# Patient Record
Sex: Female | Born: 1975 | Race: White | Hispanic: No | Marital: Married | Smoking: Former smoker
Health system: Southern US, Community
[De-identification: ages and names within clinical notes are randomized; demographics above are authoritative.]

## PROBLEM LIST (undated history)

## (undated) DIAGNOSIS — K219 Gastro-esophageal reflux disease without esophagitis: Secondary | ICD-10-CM

## (undated) DIAGNOSIS — IMO0002 Reserved for concepts with insufficient information to code with codable children: Secondary | ICD-10-CM

## (undated) DIAGNOSIS — R569 Unspecified convulsions: Secondary | ICD-10-CM

## (undated) DIAGNOSIS — G43909 Migraine, unspecified, not intractable, without status migrainosus: Secondary | ICD-10-CM

## (undated) DIAGNOSIS — J45909 Unspecified asthma, uncomplicated: Secondary | ICD-10-CM

## (undated) DIAGNOSIS — F419 Anxiety disorder, unspecified: Secondary | ICD-10-CM

## (undated) HISTORY — PX: ENDOMETRIAL ABLATION: SHX621

## (undated) HISTORY — PX: CHOLECYSTECTOMY: SHX55

## (undated) HISTORY — DX: Gastro-esophageal reflux disease without esophagitis: K21.9

## (undated) HISTORY — PX: BREAST LUMPECTOMY: SHX2

## (undated) HISTORY — PX: TUBAL LIGATION: SHX77

---

## 2011-03-11 ENCOUNTER — Emergency Department (HOSPITAL_COMMUNITY)
Admission: EM | Admit: 2011-03-11 | Discharge: 2011-03-12 | Disposition: A | Discharge status: Home / Self Care | Payer: Medicare Other | Attending: Emergency Medicine | Admitting: Emergency Medicine

## 2011-03-11 DIAGNOSIS — G40909 Epilepsy, unspecified, not intractable, without status epilepticus: Secondary | ICD-10-CM | POA: Insufficient documentation

## 2011-03-11 DIAGNOSIS — R112 Nausea with vomiting, unspecified: Secondary | ICD-10-CM | POA: Insufficient documentation

## 2011-03-11 DIAGNOSIS — R109 Unspecified abdominal pain: Secondary | ICD-10-CM | POA: Insufficient documentation

## 2011-03-11 DIAGNOSIS — Z79899 Other long term (current) drug therapy: Secondary | ICD-10-CM | POA: Insufficient documentation

## 2011-03-11 DIAGNOSIS — R10813 Right lower quadrant abdominal tenderness: Secondary | ICD-10-CM | POA: Insufficient documentation

## 2011-03-11 LAB — CBC
MCH: 32.4 pg (ref 26.0–34.0)
MCHC: 34.7 g/dL (ref 30.0–36.0)
MCV: 93.2 fL (ref 78.0–100.0)
Platelets: 262 10*3/uL (ref 150–400)
RDW: 13.6 % (ref 11.5–15.5)
WBC: 9.3 10*3/uL (ref 4.0–10.5)

## 2011-03-11 LAB — DIFFERENTIAL
Eosinophils Absolute: 0.1 10*3/uL (ref 0.0–0.7)
Eosinophils Relative: 1 % (ref 0–5)
Lymphs Abs: 3.9 10*3/uL (ref 0.7–4.0)
Monocytes Relative: 5 % (ref 3–12)

## 2011-03-11 LAB — URINALYSIS, ROUTINE W REFLEX MICROSCOPIC
Ketones, ur: NEGATIVE mg/dL
Nitrite: NEGATIVE
Specific Gravity, Urine: 1.01 (ref 1.005–1.030)
pH: 7 (ref 5.0–8.0)

## 2011-03-11 LAB — URINE MICROSCOPIC-ADD ON

## 2011-03-12 ENCOUNTER — Emergency Department (HOSPITAL_COMMUNITY): Payer: Medicare Other

## 2011-03-12 LAB — COMPREHENSIVE METABOLIC PANEL
AST: 17 U/L (ref 0–37)
Albumin: 3.2 g/dL — ABNORMAL LOW (ref 3.5–5.2)
Calcium: 9 mg/dL (ref 8.4–10.5)
Creatinine, Ser: 0.56 mg/dL (ref 0.50–1.10)
Total Protein: 6.5 g/dL (ref 6.0–8.3)

## 2011-03-12 LAB — PREGNANCY, URINE: Preg Test, Ur: NEGATIVE

## 2011-03-13 LAB — URINE CULTURE
Colony Count: >=100000
Culture  Setup Time: 201207201048

## 2011-03-19 DIAGNOSIS — G43009 Migraine without aura, not intractable, without status migrainosus: Secondary | ICD-10-CM | POA: Insufficient documentation

## 2011-03-19 DIAGNOSIS — F410 Panic disorder [episodic paroxysmal anxiety] without agoraphobia: Secondary | ICD-10-CM | POA: Insufficient documentation

## 2012-04-27 ENCOUNTER — Emergency Department (HOSPITAL_COMMUNITY)
Admission: EM | Admit: 2012-04-27 | Discharge: 2012-04-27 | Disposition: A | Discharge status: Home / Self Care | Payer: Medicare Other | Attending: Emergency Medicine | Admitting: Emergency Medicine

## 2012-04-27 ENCOUNTER — Encounter (HOSPITAL_COMMUNITY): Payer: Self-pay | Admitting: Emergency Medicine

## 2012-04-27 DIAGNOSIS — F411 Generalized anxiety disorder: Secondary | ICD-10-CM | POA: Insufficient documentation

## 2012-04-27 DIAGNOSIS — G43909 Migraine, unspecified, not intractable, without status migrainosus: Secondary | ICD-10-CM | POA: Insufficient documentation

## 2012-04-27 DIAGNOSIS — F17200 Nicotine dependence, unspecified, uncomplicated: Secondary | ICD-10-CM | POA: Insufficient documentation

## 2012-04-27 HISTORY — DX: Migraine, unspecified, not intractable, without status migrainosus: G43.909

## 2012-04-27 HISTORY — DX: Unspecified convulsions: R56.9

## 2012-04-27 HISTORY — DX: Anxiety disorder, unspecified: F41.9

## 2012-04-27 MED ORDER — PROMETHAZINE HCL 25 MG/ML IJ SOLN
25.0000 mg | Freq: Once | INTRAMUSCULAR | Status: AC
  Administered 2012-04-27: 25 mg via INTRAMUSCULAR
  Filled 2012-04-27 (×2): qty 1

## 2012-04-27 MED ORDER — BUTORPHANOL TARTRATE 1 MG/ML IJ SOLN
1.0000 mg | Freq: Once | INTRAMUSCULAR | Status: AC
  Administered 2012-04-27: 1 mg via INTRAMUSCULAR

## 2012-04-27 NOTE — ED Notes (Addendum)
Migraine since Sunday night; reports her migraine meds are not helping; went to neurologist yesterday and got infusion of depakon, toradol, phenergan- and reports still having migraine; reports tried to go back to MD today, but office was already closed; light sensitivity , nausea; no vomiting; reports long hx of migraines--states gets them once a month

## 2012-04-27 NOTE — ED Provider Notes (Signed)
History     CSN: [id number]  Arrival date & time 04/27/12  1723   First MD Initiated Contact with Patient 04/27/12 2122      Chief Complaint  Patient presents with  . Migraine   HPI  History provided by the patient. Patient is a 36 year old female with history of migraines and seizure disorder who presents with complaints of return of migraine headache. Patient reports having gradual onset of typical migraine symptoms last Sunday, 4 days ago. Patient has both by mouth and IM Imitrex at home which she attempted the use of all times without improvement. Patient was seen by her neurologist, Dr. Ala Dach on Tuesday for her symptoms. She reports receiving an infusion of the medication as well as Phenergan in the office and was feeling some improvements. She did well for most the following day the symptoms returned in the afternoon and continued all day this morning. Patient attempted to recontact Dr. Ala Dach but was unable. Symptoms are associated with some photophobia and nausea symptoms. Patient denies any fever, chills, neck pain, vomiting symptoms. She denies any numbness or weakness in extremities.     Past Medical History  Diagnosis Date  . Migraine   . Seizures   . Anxiety     Past Surgical History  Procedure Date  . Cesarean section   . Cholecystectomy   . Tubal ligation   . Endometrial ablation     History reviewed. No pertinent family history.  History  Substance Use Topics  . Smoking status: Current Everyday Smoker -- 1.5 packs/day    Types: Cigarettes  . Smokeless tobacco: Not on file  . Alcohol Use: No    OB History    Grav Para Term Preterm Abortions TAB SAB Ect Mult Living                  Review of Systems  Constitutional: Negative for fever and chills.  HENT: Negative for sinus pressure.   Eyes: Positive for photophobia.  Respiratory: Negative for cough and shortness of breath.   Cardiovascular: Negative for chest pain.  Gastrointestinal: Positive for  nausea. Negative for vomiting, abdominal pain and constipation.  Genitourinary: Negative for dysuria, frequency and hematuria.  Neurological: Positive for headaches. Negative for dizziness and numbness.    Allergies  Demerol and Ibuprofen  Home Medications   Current Outpatient Rx  Name Route Sig Dispense Refill  . CLONAZEPAM 0.5 MG PO TABS Oral Take 0.5 mg by mouth 3 (three) times daily as needed. For anxiety and 0.5 tablet at bed time    . GABAPENTIN 600 MG PO TABS Oral Take 600 mg by mouth 3 (three) times daily.    Marland Kitchen LEVETIRACETAM 1000 MG PO TABS Oral Take 1,000 mg by mouth 2 (two) times daily.    . SUMATRIPTAN SUCCINATE 100 MG PO TABS Oral Take 100 mg by mouth every 2 (two) hours as needed. For migrain    . VENLAFAXINE HCL ER 150 MG PO CP24 Oral Take 150 mg by mouth daily.      BP 111/71  Pulse 86  Temp 98.5 F (36.9 C) (Oral)  Resp 14  SpO2 98%  Physical Exam  Nursing note and vitals reviewed. Constitutional: She is oriented to person, place, and time. She appears well-developed and well-nourished. No distress.  HENT:  Head: Normocephalic and atraumatic.  Mouth/Throat: Oropharynx is clear and moist.  Eyes: Conjunctivae and EOM are normal. Pupils are equal, round, and reactive to light.  Neck: Normal range of motion. Neck  supple.       No meningeal signs  Cardiovascular: Normal rate and regular rhythm.   Pulmonary/Chest: Effort normal and breath sounds normal. No respiratory distress. She has no wheezes. She has no rales.  Abdominal: Soft. There is no tenderness.  Musculoskeletal: Normal range of motion. She exhibits no edema and no tenderness.  Neurological: She is alert and oriented to person, place, and time. She has normal strength. No cranial nerve deficit or sensory deficit. Coordination normal.  Skin: Skin is warm and dry.  Psychiatric: She has a normal mood and affect. Her behavior is normal.    ED Course  Procedures      1. Migraine       MDM    9:25PM patient seen and evaluated. Patient with history of similar migraines. No red flag symptoms or changes in symptoms from previous. Normal nonfocal neuro exam.    2:45 PM patient reports feeling significant improvements with almost no headache pain at this time. She is ready to return home. She will followup with her neurologist, Dr. Ala Dach and PCP for continued migraine treatments.      Angus Seller, [place] 04/27/12 2252

## 2012-04-28 NOTE — ED Provider Notes (Signed)
Medical screening examination/treatment/procedure(s) were performed by non-physician practitioner and as supervising physician I was immediately available for consultation/collaboration.   Tobin Chad, MD 04/28/12 [phone]

## 2012-06-10 ENCOUNTER — Encounter (HOSPITAL_COMMUNITY): Payer: Self-pay | Admitting: Emergency Medicine

## 2012-06-10 ENCOUNTER — Emergency Department (HOSPITAL_COMMUNITY)
Admission: EM | Admit: 2012-06-10 | Discharge: 2012-06-10 | Disposition: A | Discharge status: Home / Self Care | Payer: Medicare Other | Attending: Emergency Medicine | Admitting: Emergency Medicine

## 2012-06-10 DIAGNOSIS — G43909 Migraine, unspecified, not intractable, without status migrainosus: Secondary | ICD-10-CM | POA: Insufficient documentation

## 2012-06-10 DIAGNOSIS — Z79899 Other long term (current) drug therapy: Secondary | ICD-10-CM | POA: Insufficient documentation

## 2012-06-10 DIAGNOSIS — F17200 Nicotine dependence, unspecified, uncomplicated: Secondary | ICD-10-CM | POA: Insufficient documentation

## 2012-06-10 MED ORDER — KETOROLAC TROMETHAMINE 30 MG/ML IJ SOLN
30.0000 mg | Freq: Once | INTRAMUSCULAR | Status: AC
  Administered 2012-06-10: 30 mg via INTRAVENOUS
  Filled 2012-06-10: qty 1

## 2012-06-10 MED ORDER — METOCLOPRAMIDE HCL 5 MG/ML IJ SOLN
10.0000 mg | Freq: Once | INTRAMUSCULAR | Status: AC
  Administered 2012-06-10: 10 mg via INTRAVENOUS
  Filled 2012-06-10: qty 2

## 2012-06-10 MED ORDER — LORAZEPAM 2 MG/ML IJ SOLN
1.0000 mg | Freq: Once | INTRAMUSCULAR | Status: AC
  Administered 2012-06-10: 1 mg via INTRAVENOUS
  Filled 2012-06-10: qty 1

## 2012-06-10 MED ORDER — DEXAMETHASONE SODIUM PHOSPHATE 4 MG/ML IJ SOLN
10.0000 mg | Freq: Once | INTRAMUSCULAR | Status: AC
  Administered 2012-06-10: 10 mg via INTRAVENOUS
  Filled 2012-06-10: qty 2
  Filled 2012-06-10: qty 1

## 2012-06-10 MED ORDER — SODIUM CHLORIDE 0.9 % IV BOLUS (SEPSIS)
500.0000 mL | INTRAVENOUS | Status: AC
  Administered 2012-06-10: 500 mL via INTRAVENOUS

## 2012-06-10 MED ORDER — DIPHENHYDRAMINE HCL 50 MG/ML IJ SOLN
25.0000 mg | Freq: Once | INTRAMUSCULAR | Status: AC
  Administered 2012-06-10: 25 mg via INTRAVENOUS
  Filled 2012-06-10: qty 1

## 2012-06-10 NOTE — ED Notes (Signed)
Pt c/o migraine HA x 5 days; pt sts seen by neurologist x 3 this week and given stadol and phenergan that worked but came

## 2012-06-10 NOTE — ED Notes (Signed)
States she received a depakon? infusion at neuro md office earlier this week for migraines and has redness in her arm where the iv was. Describes as "sore." light red area noted to RLFA, cms intact, will notify edpa

## 2012-06-10 NOTE — ED Notes (Addendum)
Last saw neurologist on Thursday. Was phenergan & stadol injection with relief of migraine. Reports migraine returned this morning. +n/v, + photophobia. States this is her typical migraine & pain behind both eyes, R>L. Took imitrex 100mg  at 0800 & tylenol 500mg  at 1000 with no change in pain

## 2012-06-10 NOTE — ED Provider Notes (Signed)
History     CSN: [id number]  Arrival date & time 06/10/12  1135   First MD Initiated Contact with Patient 06/10/12 1335      Chief Complaint  Patient presents with  . Migraine    (Consider location/radiation/quality/duration/timing/severity/associated sxs/prior treatment) Patient is a 36 y.o. female presenting with headaches. The history is provided by the patient.  Headache  This is a chronic problem. The current episode started yesterday. The problem occurs constantly. The problem has not changed since onset.The headache is associated with bright light and straining. The pain is located in the frontal region. The quality of the pain is described as throbbing. The pain is at a severity of 10/10. The pain is moderate. The pain does not radiate. Associated symptoms include nausea. Pertinent negatives include no anorexia, no fever, no malaise/fatigue, no chest pressure, no near-syncope, no orthopnea, no palpitations, no syncope, no shortness of breath and no vomiting. Treatments tried: immitrex.    Past Medical History  Diagnosis Date  . Migraine   . Seizures   . Anxiety     Past Surgical History  Procedure Date  . Cesarean section   . Cholecystectomy   . Tubal ligation   . Endometrial ablation     History reviewed. No pertinent family history.  History  Substance Use Topics  . Smoking status: Current Every Day Smoker -- 1.5 packs/day    Types: Cigarettes  . Smokeless tobacco: Not on file  . Alcohol Use: No    OB History    Grav Para Term Preterm Abortions TAB SAB Ect Mult Living                  Review of Systems  Constitutional: Positive for activity change. Negative for fever, chills, malaise/fatigue, diaphoresis and fatigue.  HENT: Negative for ear pain, congestion, facial swelling, neck pain, neck stiffness, sinus pressure and tinnitus.   Eyes: Positive for photophobia. Negative for redness and visual disturbance.  Respiratory: Negative for cough, shortness  of breath, wheezing and stridor.   Cardiovascular: Negative for chest pain, palpitations, orthopnea, syncope and near-syncope.  Gastrointestinal: Positive for nausea. Negative for vomiting, abdominal pain and anorexia.  Musculoskeletal: Negative for myalgias and gait problem.  Skin: Negative for rash.  Neurological: Positive for headaches. Negative for dizziness, syncope, speech difficulty, weakness, light-headedness and numbness.       No bowel or bladder incontinence.  Psychiatric/Behavioral: Negative for confusion.  All other systems reviewed and are negative.    Allergies  Demerol and Ibuprofen  Home Medications   Current Outpatient Rx  Name Route Sig Dispense Refill  . ALBUTEROL SULFATE HFA 108 (90 BASE) MCG/ACT IN AERS Inhalation Inhale 2 puffs into the lungs every 6 (six) hours as needed. For shortness of breath    . CLONAZEPAM 0.5 MG PO TABS Oral Take 0.5 mg by mouth 3 (three) times daily as needed. For anxiety and 0.5 tablet at bed time    . GABAPENTIN 600 MG PO TABS Oral Take 600 mg by mouth 3 (three) times daily.    Marland Kitchen LEVETIRACETAM 1000 MG PO TABS Oral Take 1,000 mg by mouth 2 (two) times daily.    . SUMATRIPTAN SUCCINATE 100 MG PO TABS Oral Take 100 mg by mouth every 2 (two) hours as needed. For migrain    . VENLAFAXINE HCL ER 150 MG PO CP24 Oral Take 150 mg by mouth daily.      BP 93/62  Pulse 90  Temp 98.4 F (36.9 C) (Oral)  Resp 15  SpO2 96%  Physical Exam  Nursing note and vitals reviewed. Constitutional: She is oriented to person, place, and time. She appears well-developed and well-nourished. She appears distressed.  HENT:  Head: Normocephalic and atraumatic.  Eyes: Conjunctivae normal and EOM are normal. Pupils are equal, round, and reactive to light. No scleral icterus.  Neck: Normal range of motion.       Neck supple with no nuchal rigidity, full pain free ROM. No carotid bruit  Cardiovascular: Normal rate, regular rhythm, normal heart sounds and intact  distal pulses.   Pulmonary/Chest: Effort normal and breath sounds normal. No respiratory distress.  Musculoskeletal: Normal range of motion.  Neurological: She is alert and oriented to person, place, and time. She has normal strength.       CN III-XII intact, good coordination, normal gait, strength 5/5 bilaterally, intact distal sensation.  Skin: Skin is warm and dry.       No rash, non diaphoretic    ED Course  Procedures (including critical care time)  Labs Reviewed - No data to display No results found.   No diagnosis found.  Meds ordered this encounter  Medications  . albuterol (PROVENTIL HFA;VENTOLIN HFA) 108 (90 BASE) MCG/ACT inhaler    Sig: Inhale 2 puffs into the lungs every 6 (six) hours as needed. For shortness of breath  . sodium chloride 0.9 % bolus 500 mL    Sig:   . metoCLOPramide (REGLAN) injection 10 mg    Sig:   . diphenhydrAMINE (BENADRYL) injection 25 mg    Sig:   . dexamethasone (DECADRON) injection 10 mg    Sig:   . ketorolac (TORADOL) 30 MG/ML injection 30 mg    Sig:   . LORazepam (ATIVAN) injection 1 mg    Sig:      MDM  HA Presentation is like pts typical HA and non concerning for [facility], ICH, Meningitis, or temporal arteritis. Pt is afebrile with no focal neuro deficits, nuchal rigidity, or change in vision. Pt's HA has has not yet improved. Care resumed by incoming practitioner. Anticpiate discharge.          Jaci Carrel, [place] 06/10/12 1601

## 2012-06-11 NOTE — ED Provider Notes (Signed)
Medical screening examination/treatment/procedure(s) were performed by non-physician practitioner and as supervising physician I was immediately available for consultation/collaboration.   Joya Gaskins, MD 06/11/12 [phone]

## 2012-07-14 ENCOUNTER — Emergency Department (HOSPITAL_COMMUNITY)
Admission: EM | Admit: 2012-07-14 | Discharge: 2012-07-14 | Disposition: A | Discharge status: Home / Self Care | Payer: Medicare Other | Attending: Emergency Medicine | Admitting: Emergency Medicine

## 2012-07-14 ENCOUNTER — Encounter (HOSPITAL_COMMUNITY): Payer: Self-pay | Admitting: Emergency Medicine

## 2012-07-14 DIAGNOSIS — F411 Generalized anxiety disorder: Secondary | ICD-10-CM | POA: Insufficient documentation

## 2012-07-14 DIAGNOSIS — Z79899 Other long term (current) drug therapy: Secondary | ICD-10-CM | POA: Insufficient documentation

## 2012-07-14 DIAGNOSIS — G43909 Migraine, unspecified, not intractable, without status migrainosus: Secondary | ICD-10-CM

## 2012-07-14 DIAGNOSIS — I498 Other specified cardiac arrhythmias: Secondary | ICD-10-CM | POA: Insufficient documentation

## 2012-07-14 DIAGNOSIS — F17200 Nicotine dependence, unspecified, uncomplicated: Secondary | ICD-10-CM | POA: Insufficient documentation

## 2012-07-14 DIAGNOSIS — R569 Unspecified convulsions: Secondary | ICD-10-CM

## 2012-07-14 LAB — COMPREHENSIVE METABOLIC PANEL
ALT: 22 U/L (ref 0–35)
Calcium: 8.9 mg/dL (ref 8.4–10.5)
Creatinine, Ser: 0.67 mg/dL (ref 0.50–1.10)
GFR calc Af Amer: >90 mL/min (ref >90)
Glucose, Bld: 166 mg/dL — ABNORMAL HIGH (ref 70–99)
Sodium: 134 mEq/L — ABNORMAL LOW (ref 135–145)
Total Protein: 7.1 g/dL (ref 6.0–8.3)

## 2012-07-14 LAB — URINALYSIS, ROUTINE W REFLEX MICROSCOPIC
Bilirubin Urine: NEGATIVE
Leukocytes, UA: NEGATIVE
Nitrite: NEGATIVE
Specific Gravity, Urine: 1.013 (ref 1.005–1.030)
Urobilinogen, UA: 0.2 mg/dL (ref 0.0–1.0)
pH: 6 (ref 5.0–8.0)

## 2012-07-14 LAB — CBC WITH DIFFERENTIAL/PLATELET
Basophils Absolute: 0 10*3/uL (ref 0.0–0.1)
Eosinophils Absolute: 0.1 10*3/uL (ref 0.0–0.7)
Eosinophils Relative: 1 % (ref 0–5)
Lymphs Abs: 2.3 10*3/uL (ref 0.7–4.0)
MCH: 31.4 pg (ref 26.0–34.0)
MCV: 95 fL (ref 78.0–100.0)
Monocytes Absolute: 0.4 10*3/uL (ref 0.1–1.0)
Platelets: 258 10*3/uL (ref 150–400)
RDW: 14 % (ref 11.5–15.5)

## 2012-07-14 MED ORDER — DEXAMETHASONE SODIUM PHOSPHATE 10 MG/ML IJ SOLN
10.0000 mg | Freq: Once | INTRAMUSCULAR | Status: AC
  Administered 2012-07-14: 10 mg via INTRAVENOUS
  Filled 2012-07-14: qty 1

## 2012-07-14 MED ORDER — METOCLOPRAMIDE HCL 5 MG/ML IJ SOLN
10.0000 mg | Freq: Once | INTRAMUSCULAR | Status: AC
  Administered 2012-07-14: 10 mg via INTRAVENOUS
  Filled 2012-07-14: qty 2

## 2012-07-14 MED ORDER — LORAZEPAM 2 MG/ML IJ SOLN
1.0000 mg | Freq: Once | INTRAMUSCULAR | Status: AC
  Administered 2012-07-14: 1 mg via INTRAVENOUS
  Filled 2012-07-14: qty 1

## 2012-07-14 MED ORDER — HYDROMORPHONE HCL PF 1 MG/ML IJ SOLN
1.0000 mg | Freq: Once | INTRAMUSCULAR | Status: AC
  Administered 2012-07-14: 1 mg via INTRAVENOUS
  Filled 2012-07-14: qty 1

## 2012-07-14 MED ORDER — DIPHENHYDRAMINE HCL 50 MG/ML IJ SOLN
25.0000 mg | Freq: Once | INTRAMUSCULAR | Status: AC
  Administered 2012-07-14: 25 mg via INTRAVENOUS
  Filled 2012-07-14: qty 1

## 2012-07-14 MED ORDER — ONDANSETRON HCL 4 MG/2ML IJ SOLN
4.0000 mg | Freq: Once | INTRAMUSCULAR | Status: AC
  Administered 2012-07-14: 4 mg via INTRAVENOUS
  Filled 2012-07-14: qty 2

## 2012-07-14 NOTE — ED Notes (Signed)
States that she missed her sz meds yesterday and today because she was so nauseated it is keppra

## 2012-07-14 NOTE — ED Provider Notes (Signed)
History     CSN: [id number]  Arrival date & time 07/14/12  1448   First MD Initiated Contact with Patient 07/14/12 1741      No chief complaint on file.   (Consider location/radiation/quality/duration/timing/severity/associated sxs/prior treatment) Patient is a 36 y.o. female presenting with headaches. The history is provided by the patient.  Headache  This is a new problem. The current episode started more than 2 days ago. The problem occurs constantly. The problem has been gradually worsening. The headache is associated with bright light and loud noise. The pain is located in the right unilateral region. The quality of the pain is described as throbbing. The pain is at a severity of 9/10. The pain is severe. The pain does not radiate. Associated symptoms include anorexia, nausea and vomiting. Pertinent negatives include no fever, no palpitations and no shortness of breath. Associated symptoms comments: The patient has a history of seizure disorder. She takes Keppra for this but has not taken in 2 days. She states she had 2 seizures back-to-back today but denies any trauma. The seizures occurred while she was lying in bed. She states that she's had a migraine all week she had a headache before the seizure and it got worse after the seizure. She denies any weakness, numbness or vision change. She has tried acetaminophen for the symptoms. The treatment provided no relief.    Past Medical History  Diagnosis Date  . Migraine   . Seizures   . Anxiety     Past Surgical History  Procedure Date  . Cesarean section   . Cholecystectomy   . Tubal ligation   . Endometrial ablation     No family history on file.  History  Substance Use Topics  . Smoking status: Current Every Day Smoker -- 1.5 packs/day    Types: Cigarettes  . Smokeless tobacco: Not on file  . Alcohol Use: No    OB History    Grav Para Term Preterm Abortions TAB SAB Ect Mult Living                  Review of  Systems  Constitutional: Negative for fever.  Respiratory: Negative for shortness of breath.   Cardiovascular: Negative for palpitations.  Gastrointestinal: Positive for nausea, vomiting and anorexia.  Neurological: Positive for headaches. Negative for speech difficulty, weakness and numbness.  All other systems reviewed and are negative.    Allergies  Demerol and Ibuprofen  Home Medications   Current Outpatient Rx  Name  Route  Sig  Dispense  Refill  . CLONAZEPAM 0.5 MG PO TABS   Oral   Take 0.5 mg by mouth 3 (three) times daily as needed. For anxiety and 0.5 tablet at bed time         . ALBUTEROL SULFATE HFA 108 (90 BASE) MCG/ACT IN AERS   Inhalation   Inhale 2 puffs into the lungs every 6 (six) hours as needed. For shortness of breath         . GABAPENTIN 600 MG PO TABS   Oral   Take 600 mg by mouth 3 (three) times daily.         Marland Kitchen LEVETIRACETAM 1000 MG PO TABS   Oral   Take 1,000 mg by mouth 2 (two) times daily.         . SUMATRIPTAN SUCCINATE 100 MG PO TABS   Oral   Take 100 mg by mouth every 2 (two) hours as needed. For migrain         .  VENLAFAXINE HCL ER 150 MG PO CP24   Oral   Take 150 mg by mouth daily.           BP 117/71  Pulse 108  Temp 98.5 F (36.9 C) (Oral)  Resp 18  SpO2 97%  Physical Exam  Nursing note and vitals reviewed. Constitutional: She is oriented to person, place, and time. She appears well-developed and well-nourished. She appears distressed.  HENT:  Head: Normocephalic and atraumatic.  Eyes: EOM are normal. Pupils are equal, round, and reactive to light.  Fundoscopic exam:      The right eye shows no papilledema.       The left eye shows no papilledema.  Neck: Normal range of motion. Neck supple.  Cardiovascular: Normal rate, regular rhythm, normal heart sounds and intact distal pulses.  Exam reveals no friction rub.   No murmur heard. Pulmonary/Chest: Effort normal and breath sounds normal. She has no wheezes. She  has no rales.  Abdominal: Soft. Bowel sounds are normal. She exhibits no distension. There is no tenderness. There is no rebound and no guarding.  Musculoskeletal: Normal range of motion. She exhibits no tenderness.       No edema  Lymphadenopathy:    She has no cervical adenopathy.  Neurological: She is alert and oriented to person, place, and time. She has normal strength. No cranial nerve deficit or sensory deficit. Gait normal.       photophobia  Skin: Skin is warm and dry. No rash noted.  Psychiatric: She has a normal mood and affect. Her behavior is normal.    ED Course  Procedures (including critical care time)  Labs Reviewed  COMPREHENSIVE METABOLIC PANEL - Abnormal; Notable for the following:    Sodium 134 (*)     Glucose, Bld 166 (*)     Albumin 3.4 (*)     Total Bilirubin 0.2 (*)     All other components within normal limits  CBC WITH DIFFERENTIAL  URINALYSIS, ROUTINE W REFLEX MICROSCOPIC   No results found.   Date: 07/14/2012  Rate: 119  Rhythm: sinus tachycardia  QRS Axis: normal  Intervals: normal  ST/T Wave abnormalities: normal  Conduction Disutrbances:none  Narrative Interpretation:   Old EKG Reviewed: none available    1. Migraine   2. Seizure       MDM   Patient does have a history of a seizure disorder and has not taken her Her for 2 days do to being nauseated with her migraine. She has no focal neurologic deficits and is currently not seizing. She was given 1 mg of Ativan to avoid further seizures. Pt with typical migraine HA without sx suggestive of SAH(sudden onset, worst of life, or deficits), infection, or cavernous vein thrombosis.  Normal neuro exam and vital signs. Will give HA cocktail and on re-eval.  7:48 PM On reeval pt states HA is from a 10 to a 7.  Will give second round of pain meds and recheck.  8:43 PM Pt feeling better and ready to go home.     Gwyneth Sprout, MD 07/14/12 2043

## 2012-07-14 NOTE — ED Notes (Addendum)
Per patient she says her son heard her fall today and she was "trembling" when he came in the room and he saw her have another seizure. Pt states that she has been nauseous x3 days and has been unable to take her seizure medications for three days. Pt states her last seizure was about a month ago and she thinks she was taking her medications then.

## 2012-07-14 NOTE — ED Notes (Signed)
Was told she had  2 sz today has seen her neuro dr x 2 this week for migrains was given infusion and then injections but not helping still has h/a

## 2012-07-14 NOTE — ED Notes (Signed)
Pt states "I am ready to be discharged, I am feeling much better."

## 2012-07-27 ENCOUNTER — Encounter (HOSPITAL_COMMUNITY): Payer: Self-pay | Admitting: Emergency Medicine

## 2012-07-27 ENCOUNTER — Emergency Department (HOSPITAL_COMMUNITY): Payer: Medicare Other

## 2012-07-27 ENCOUNTER — Emergency Department (HOSPITAL_COMMUNITY)
Admission: EM | Admit: 2012-07-27 | Discharge: 2012-07-27 | Disposition: A | Discharge status: Home / Self Care | Payer: Medicare Other | Attending: Emergency Medicine | Admitting: Emergency Medicine

## 2012-07-27 DIAGNOSIS — Z79899 Other long term (current) drug therapy: Secondary | ICD-10-CM | POA: Insufficient documentation

## 2012-07-27 DIAGNOSIS — R112 Nausea with vomiting, unspecified: Secondary | ICD-10-CM | POA: Insufficient documentation

## 2012-07-27 DIAGNOSIS — Z8679 Personal history of other diseases of the circulatory system: Secondary | ICD-10-CM | POA: Insufficient documentation

## 2012-07-27 DIAGNOSIS — R109 Unspecified abdominal pain: Secondary | ICD-10-CM

## 2012-07-27 DIAGNOSIS — F411 Generalized anxiety disorder: Secondary | ICD-10-CM | POA: Insufficient documentation

## 2012-07-27 DIAGNOSIS — G40909 Epilepsy, unspecified, not intractable, without status epilepticus: Secondary | ICD-10-CM | POA: Insufficient documentation

## 2012-07-27 DIAGNOSIS — R1031 Right lower quadrant pain: Secondary | ICD-10-CM | POA: Insufficient documentation

## 2012-07-27 DIAGNOSIS — F17200 Nicotine dependence, unspecified, uncomplicated: Secondary | ICD-10-CM | POA: Insufficient documentation

## 2012-07-27 DIAGNOSIS — Z7982 Long term (current) use of aspirin: Secondary | ICD-10-CM | POA: Insufficient documentation

## 2012-07-27 DIAGNOSIS — Z3202 Encounter for pregnancy test, result negative: Secondary | ICD-10-CM | POA: Insufficient documentation

## 2012-07-27 LAB — COMPREHENSIVE METABOLIC PANEL
AST: 15 U/L (ref 0–37)
Albumin: 3.6 g/dL (ref 3.5–5.2)
Calcium: 8.9 mg/dL (ref 8.4–10.5)
Chloride: 104 mEq/L (ref 96–112)
Creatinine, Ser: 0.73 mg/dL (ref 0.50–1.10)
Total Protein: 7.2 g/dL (ref 6.0–8.3)

## 2012-07-27 LAB — URINALYSIS, ROUTINE W REFLEX MICROSCOPIC
Bilirubin Urine: NEGATIVE
Glucose, UA: NEGATIVE mg/dL
Ketones, ur: NEGATIVE mg/dL
pH: 5.5 (ref 5.0–8.0)

## 2012-07-27 LAB — CBC
HCT: 42.1 % (ref 36.0–46.0)
Hemoglobin: 13.6 g/dL (ref 12.0–15.0)
MCH: 31.6 pg (ref 26.0–34.0)
MCV: 97.7 fL (ref 78.0–100.0)
RBC: 4.31 MIL/uL (ref 3.87–5.11)

## 2012-07-27 MED ORDER — ONDANSETRON HCL 4 MG/2ML IJ SOLN
4.0000 mg | Freq: Once | INTRAMUSCULAR | Status: AC
  Administered 2012-07-27: 4 mg via INTRAVENOUS
  Filled 2012-07-27: qty 2

## 2012-07-27 MED ORDER — OXYCODONE-ACETAMINOPHEN 5-325 MG PO TABS: 1.0000 | ORAL_TABLET | Freq: Four times a day (QID) | ORAL | Status: DC | PRN

## 2012-07-27 MED ORDER — IOHEXOL 300 MG/ML  SOLN
100.0000 mL | Freq: Once | INTRAMUSCULAR | Status: AC | PRN
  Administered 2012-07-27: 100 mL via INTRAVENOUS

## 2012-07-27 MED ORDER — POTASSIUM CHLORIDE CRYS ER 20 MEQ PO TBCR
40.0000 meq | EXTENDED_RELEASE_TABLET | Freq: Once | ORAL | Status: AC
  Administered 2012-07-27: 40 meq via ORAL
  Filled 2012-07-27: qty 2

## 2012-07-27 MED ORDER — MORPHINE SULFATE 4 MG/ML IJ SOLN
4.0000 mg | Freq: Once | INTRAMUSCULAR | Status: AC
  Administered 2012-07-27: 4 mg via INTRAVENOUS
  Filled 2012-07-27: qty 1

## 2012-07-27 MED ORDER — HYDROMORPHONE HCL PF 1 MG/ML IJ SOLN
1.0000 mg | Freq: Once | INTRAMUSCULAR | Status: AC
  Administered 2012-07-27: 1 mg via INTRAVENOUS
  Filled 2012-07-27: qty 1

## 2012-07-27 MED ORDER — IOHEXOL 300 MG/ML  SOLN
20.0000 mL | INTRAMUSCULAR | Status: AC
  Administered 2012-07-27 (×2): 20 mL via ORAL

## 2012-07-27 NOTE — ED Provider Notes (Signed)
History     CSN: [id number]  Arrival date & time 07/27/12  3086   First MD Initiated Contact with Patient 07/27/12 0914      Chief Complaint  Patient presents with  . Abdominal Pain    (Consider location/radiation/quality/duration/timing/severity/associated sxs/prior treatment) HPI Comments: 36 year old female the history of migraines, seizures and anxiety presents emergency department complaining of right-sided abdominal pain.  Onset of symptoms began yesterday and are described as a sharp stabbing pain that does not radiate.  Severity is 10/10.  Symptoms are moderate.  No known alleviating factors and all movements exacerbate pain.  Associated symptoms include emesis x1 this morning, but patient denies any fevers, night sweats, chills, change in bowel movements, trauma, chest pain, shortness of breath, melena or hematochezia.  Note patient does have a history of abdominal surgery including cesarean section x3/tubal ligation and cholecystectomy.  Last normal bowel movement yesterday morning.  The history is provided by the patient.    Past Medical History  Diagnosis Date  . Migraine   . Seizures   . Anxiety     Past Surgical History  Procedure Date  . Cesarean section   . Cholecystectomy   . Tubal ligation   . Endometrial ablation     No family history on file.  History  Substance Use Topics  . Smoking status: Current Every Day Smoker -- 1.5 packs/day    Types: Cigarettes  . Smokeless tobacco: Not on file  . Alcohol Use: No    OB History    Grav Para Term Preterm Abortions TAB SAB Ect Mult Living                  Review of Systems  Constitutional: Negative for fever, chills and appetite change.  HENT: Negative for congestion.   Eyes: Negative for visual disturbance.  Respiratory: Negative for shortness of breath.   Cardiovascular: Negative for chest pain and leg swelling.  Gastrointestinal: Positive for nausea, vomiting and abdominal pain. Negative for  diarrhea, constipation, blood in stool, abdominal distention and rectal pain.  Genitourinary: Negative for dysuria, urgency and frequency.  Neurological: Negative for dizziness, syncope, weakness, light-headedness, numbness and headaches.  Psychiatric/Behavioral: Negative for confusion.    Allergies  Demerol and Ibuprofen  Home Medications   Current Outpatient Rx  Name  Route  Sig  Dispense  Refill  . ALBUTEROL SULFATE HFA 108 (90 BASE) MCG/ACT IN AERS   Inhalation   Inhale 2 puffs into the lungs every 6 (six) hours as needed. For shortness of breath         . ASPIRIN-ACETAMINOPHEN 500-325 MG PO PACK   Oral   Take 1 packet by mouth daily as needed. As needed for pain.         Marland Kitchen CLONAZEPAM 0.5 MG PO TABS   Oral   Take 0.5 mg by mouth 3 (three) times daily as needed. For anxiety and 1.5 tablet at bed time         . GABAPENTIN 600 MG PO TABS   Oral   Take 600 mg by mouth 3 (three) times daily.         Marland Kitchen LEVETIRACETAM 1000 MG PO TABS   Oral   Take 1,000 mg by mouth 2 (two) times daily.         . VENLAFAXINE HCL ER 150 MG PO CP24   Oral   Take 150 mg by mouth daily.         . SUMATRIPTAN SUCCINATE 100 MG PO  TABS   Oral   Take 100 mg by mouth every 2 (two) hours as needed. For migrain           BP 108/62  Pulse 108  Temp 97.1 F (36.2 C) (Oral)  Resp 18  Ht 5\' 1"  (1.549 m)  Wt 180 lb (81.647 kg)  BMI 34.01 kg/m2  SpO2 99%  LMP 07/01/2012  Physical Exam  Nursing note and vitals reviewed. Constitutional: Vital signs are normal. She appears well-developed and well-nourished. No distress.  HENT:  Head: Normocephalic and atraumatic.  Mouth/Throat: Uvula is midline, oropharynx is clear and moist and mucous membranes are normal.  Eyes: Conjunctivae normal and EOM are normal. Pupils are equal, round, and reactive to light.  Neck: Normal range of motion and full passive range of motion without pain. Neck supple. No spinous process tenderness and no muscular  tenderness present. No rigidity. No Brudzinski's sign noted.  Cardiovascular: Regular rhythm.        Tachycardic (chronic per pt & past record), no other abnormalities on auscultation.  Pulmonary/Chest: Effort normal and breath sounds normal. No accessory muscle usage. Not tachypneic. No respiratory distress.  Abdominal: Soft. Normal appearance. She exhibits no distension, no ascites, no pulsatile midline mass and no mass. There is tenderness in the right lower quadrant. There is no CVA tenderness. No hernia.  Lymphadenopathy:    She has no cervical adenopathy.  Neurological: She is alert.  Skin: Skin is warm and dry. No rash noted. She is not diaphoretic.  Psychiatric: She has a normal mood and affect. Her speech is normal and behavior is normal.    ED Course  Procedures (including critical care time)  Labs Reviewed  COMPREHENSIVE METABOLIC PANEL - Abnormal; Notable for the following:    Potassium 3.4 (*)     Glucose, Bld 131 (*)     Total Bilirubin 0.1 (*)     All other components within normal limits  CBC - Abnormal; Notable for the following:    WBC 11.6 (*)     All other components within normal limits  URINALYSIS, ROUTINE W REFLEX MICROSCOPIC  POCT PREGNANCY, URINE   Ct Abdomen Pelvis W Contrast  07/27/2012  *RADIOLOGY REPORT*  Clinical Data: Right-sided abdominal pain with vomiting.  History of cholecystectomy.  CT ABDOMEN AND PELVIS WITH CONTRAST  Technique:  Multidetector CT imaging of the abdomen and pelvis was performed following the standard protocol during bolus administration of intravenous contrast.  Contrast: OMNIPAQUE IOHEXOL 300 MG/ML  SOLN  Comparison: Prior examination 04/22/2012.  Findings: Linear bibasilar opacities are most consistent with atelectasis, although appear slightly progressive.  No significant pleural fluid is present.  There is no significant biliary dilatation status post cholecystectomy.  The liver, pancreas and spleen appear unremarkable.   There is a stable cyst posteriorly in the mid left kidney.  The right kidney and both adrenal glands appear normal.  The cecum is positioned anteriorly in the mid right abdomen.  The terminal ileum and appendix appear normal.  There is stool throughout the colon.  No inflammatory changes are identified.  The uterus and ovaries appear unremarkable.  The urinary bladder is moderately distended without wall thickening or surrounding inflammation.  There are no enlarged abdominal pelvic lymph nodes. There are no suspicious osseous findings.  IMPRESSION:  1.  No acute abdominal pelvic findings identified.  The appendix appears normal. 2.  Moderately distended urinary bladder. 3.  Increased bibasilar pulmonary opacities are likely due to atelectasis.  However, if the  patient has pulmonary symptoms, radiographic followup would be advised.   Original Report Authenticated By: Carey Bullocks, M.D.      No diagnosis found.    MDM  Right lower quadrant pain  36 year old woman with a history of migraine, seizures, and anxiety presents emergency department with acute onset of right lower quadrant abdominal pain.  Vital signs normal other than chronic tachycardia.  Pain managed in the emergency department.  Lab and imaging reviewed showing mild hypokalemia replenished with oral potassium as well as leukocytosis.  Abdominal imaging performed to rule out appendicitis.  No acute abdominal or pelvic findings were seen.  In further evaluation of patient's chart she was seen in [facility] in 03/07/2011 with similar presentation and normal CT w contrast (other then distended bladder) & nl transvaginal US.  Pts pain has been managed well in the ER. Recommend GI/PCP follow up or return if symptoms worsen or not relieved by PO pain medications/ Pt verbalizes understanding and is agreeable w treatment plan.         Jaci Carrel, [place] 07/27/12 1315

## 2012-07-27 NOTE — ED Provider Notes (Signed)
Medical screening examination/treatment/procedure(s) were performed by non-physician practitioner and as supervising physician I was immediately available for consultation/collaboration.   Carleene Cooper III, MD 07/27/12 [phone]

## 2012-07-27 NOTE — ED Notes (Signed)
Patient ambulated to restroom and tolerated well.  

## 2012-07-27 NOTE — Discharge Instructions (Signed)
Follow up with the Gastroenterologist or general surgeon listed above for further evaluation of your abdominal pain. Only use your pain medication for severe pain. Do not operate heavy machinery while on pain medication or muscle relaxer. Note that your pain medication contains acetaminophen (Tylenol) & its is not recommended that you use additional acetaminophen (Tylenol) while taking this medication.   Abdominal Pain  Your exam might not show the exact reason you have abdominal pain. Since there are many different causes of abdominal pain, another checkup and more tests may be needed. It is very important to follow up for lasting (persistent) or worsening symptoms. A possible cause of abdominal pain in any person who still has his or her appendix is acute appendicitis. Appendicitis is often hard to diagnose. Normal blood tests, urine tests, ultrasound, and CT scans do not completely rule out early appendicitis or other causes of abdominal pain. Sometimes, only the changes that happen over time will allow appendicitis and other causes of abdominal pain to be determined. Other potential problems that may require surgery may also take time to become more apparent. Because of this, it is important that you follow all of the instructions below.   HOME CARE INSTRUCTIONS  Do not take laxatives unless directed by your caregiver. Rest as much as possible.  Do not eat solid food until your pain is gone: A diet of water, weak decaffeinated tea, broth or bouillon, gelatin, oral rehydration solutions (ORS), frozen ice pops, or ice chips may be helpful.  When pain is gone: Start a light diet (dry toast, crackers, applesauce, or white rice). Increase the diet slowly as long as it does not bother you. Eat no dairy products (including cheese and eggs) and no spicy, fatty, fried, or high-fiber foods.  Use no alcohol, caffeine, or cigarettes.  Take your regular medicines unless your caregiver told you not to.  Take any  prescribed medicine as directed.   SEEK IMMEDIATE MEDICAL CARE IF:  The pain does not go away.  You have a fever >101 that persists You keep throwing up (vomiting) or cannot drink liquids.  The pain becomes localized (Pain in the right side could possibly be appendicitis. In an adult, pain in the left lower portion of the abdomen could be colitis or diverticulitis). You pass bloody or black tarry stools.  You have shaking chills.  There is blood in your vomit or you see blood in your bowel movements.  Your bowel movements stop (become blocked) or you cannot pass gas.  You have bloody, frequent, or painful urination.  You have yellow discoloration in the skin or whites of the eyes.  Your stomach becomes bloated or bigger.  You have dizziness or fainting.  You have chest or back pain.

## 2012-07-27 NOTE — ED Notes (Signed)
Patient claims started having R sided abdomimal pain since yesterday.  Patient claims vomited once only at 0300 this morning.  Patient claims no other symptoms.

## 2012-08-14 ENCOUNTER — Emergency Department (HOSPITAL_COMMUNITY)
Admission: EM | Admit: 2012-08-14 | Discharge: 2012-08-15 | Disposition: A | Discharge status: Home / Self Care | Payer: Medicare Other | Attending: Emergency Medicine | Admitting: Emergency Medicine

## 2012-08-14 ENCOUNTER — Encounter (HOSPITAL_COMMUNITY): Payer: Self-pay | Admitting: Emergency Medicine

## 2012-08-14 DIAGNOSIS — G43909 Migraine, unspecified, not intractable, without status migrainosus: Secondary | ICD-10-CM | POA: Insufficient documentation

## 2012-08-14 DIAGNOSIS — G40909 Epilepsy, unspecified, not intractable, without status epilepticus: Secondary | ICD-10-CM | POA: Insufficient documentation

## 2012-08-14 DIAGNOSIS — H53149 Visual discomfort, unspecified: Secondary | ICD-10-CM | POA: Insufficient documentation

## 2012-08-14 DIAGNOSIS — F411 Generalized anxiety disorder: Secondary | ICD-10-CM | POA: Insufficient documentation

## 2012-08-14 DIAGNOSIS — R110 Nausea: Secondary | ICD-10-CM | POA: Insufficient documentation

## 2012-08-14 DIAGNOSIS — Z79899 Other long term (current) drug therapy: Secondary | ICD-10-CM | POA: Insufficient documentation

## 2012-08-14 DIAGNOSIS — F17200 Nicotine dependence, unspecified, uncomplicated: Secondary | ICD-10-CM | POA: Insufficient documentation

## 2012-08-14 MED ORDER — DIPHENHYDRAMINE HCL 50 MG/ML IJ SOLN
25.0000 mg | Freq: Once | INTRAMUSCULAR | Status: AC
  Administered 2012-08-14: 25 mg via INTRAMUSCULAR
  Filled 2012-08-14: qty 1

## 2012-08-14 MED ORDER — ONDANSETRON HCL 4 MG/2ML IJ SOLN
4.0000 mg | Freq: Once | INTRAMUSCULAR | Status: AC
  Administered 2012-08-14: 4 mg via INTRAMUSCULAR

## 2012-08-14 MED ORDER — ONDANSETRON HCL 4 MG/2ML IJ SOLN
4.0000 mg | Freq: Once | INTRAMUSCULAR | Status: DC
  Filled 2012-08-14: qty 2

## 2012-08-14 MED ORDER — ONDANSETRON HCL 4 MG/2ML IJ SOLN: 4.0000 mg | Freq: Once | INTRAMUSCULAR | Status: DC

## 2012-08-14 MED ORDER — HYDROMORPHONE HCL PF 1 MG/ML IJ SOLN: 0.5000 mg | Freq: Once | INTRAMUSCULAR | Status: DC

## 2012-08-14 MED ORDER — HYDROMORPHONE HCL PF 1 MG/ML IJ SOLN
0.5000 mg | Freq: Once | INTRAMUSCULAR | Status: AC
  Administered 2012-08-14: 1 mg via INTRAMUSCULAR
  Filled 2012-08-14: qty 1

## 2012-08-14 MED ORDER — METOCLOPRAMIDE HCL 5 MG/ML IJ SOLN
10.0000 mg | Freq: Once | INTRAMUSCULAR | Status: AC
  Administered 2012-08-14: 10 mg via INTRAMUSCULAR
  Filled 2012-08-14: qty 2

## 2012-08-14 MED ORDER — DEXAMETHASONE SODIUM PHOSPHATE 10 MG/ML IJ SOLN
10.0000 mg | Freq: Once | INTRAMUSCULAR | Status: AC
  Administered 2012-08-14: 10 mg via INTRAMUSCULAR
  Filled 2012-08-14: qty 1

## 2012-08-14 NOTE — ED Provider Notes (Signed)
History     CSN: [id number]  Arrival date & time 08/14/12  1647   First MD Initiated Contact with Patient 08/14/12 2048      Chief Complaint  Patient presents with  . Headache    (Consider location/radiation/quality/duration/timing/severity/associated sxs/prior treatment) HPI Comments: Patient states she started with typical migraine headache at 3:00.  This, afternoon.  She took an Imitrex without relief.  She did not repeat the dose.  This is a typical migraine headache for her behind her eyes radiating to her occipital area bilaterally, throbbing in nature.  She has nausea, but no vomiting  Patient is a 36 y.o. female presenting with headaches. The history is provided by the patient.  Headache  This is a recurrent problem. The current episode started 6 to 12 hours ago. The problem occurs constantly. The problem has not changed since onset.The headache is associated with the menstrual cycle. The pain is located in the frontal region. The quality of the pain is described as throbbing. The pain is at a severity of 6/10. The pain is moderate. Associated symptoms include nausea. Pertinent negatives include no fever and no vomiting.    Past Medical History  Diagnosis Date  . Migraine   . Seizures   . Anxiety     Past Surgical History  Procedure Date  . Cesarean section   . Cholecystectomy   . Tubal ligation   . Endometrial ablation     History reviewed. No pertinent family history.  History  Substance Use Topics  . Smoking status: Current Every Day Smoker -- 1.5 packs/day    Types: Cigarettes  . Smokeless tobacco: Not on file  . Alcohol Use: No    OB History    Grav Para Term Preterm Abortions TAB SAB Ect Mult Living                  Review of Systems  Constitutional: Negative for fever and chills.  HENT: Negative for congestion, rhinorrhea and sinus pressure.   Eyes: Positive for photophobia.  Gastrointestinal: Positive for nausea. Negative for vomiting.   Genitourinary: Negative for dysuria.  Musculoskeletal: Negative for myalgias and arthralgias.  Skin: Negative for rash and wound.  Neurological: Positive for headaches. Negative for dizziness and weakness.    Allergies  Demerol and Ibuprofen  Home Medications   Current Outpatient Rx  Name  Route  Sig  Dispense  Refill  . ALBUTEROL SULFATE HFA 108 (90 BASE) MCG/ACT IN AERS   Inhalation   Inhale 2 puffs into the lungs every 6 (six) hours as needed. For shortness of breath         . ASPIRIN-ACETAMINOPHEN 500-325 MG PO PACK   Oral   Take 1 packet by mouth daily as needed. As needed for pain.         Marland Kitchen CLONAZEPAM 0.5 MG PO TABS   Oral   Take 0.5 mg by mouth 3 (three) times daily as needed. For anxiety and 2 tablet at bed time         . GABAPENTIN 600 MG PO TABS   Oral   Take 600 mg by mouth 3 (three) times daily.         Marland Kitchen LEVETIRACETAM 1000 MG PO TABS   Oral   Take 1,000 mg by mouth 2 (two) times daily.         . SUMATRIPTAN SUCCINATE 100 MG PO TABS   Oral   Take 100 mg by mouth every 2 (two) hours as needed.  For migrain         . VENLAFAXINE HCL ER 150 MG PO CP24   Oral   Take 150 mg by mouth daily.           BP 106/65  Pulse 100  Temp 98.1 F (36.7 C) (Oral)  Resp 16  SpO2 93%  LMP 07/01/2012  Physical Exam  Constitutional: She is oriented to person, place, and time. She appears well-developed and well-nourished.  HENT:  Head: Normocephalic and atraumatic.  Right Ear: External ear normal.  Left Ear: External ear normal.  Mouth/Throat: Oropharynx is clear and moist.  Eyes: Pupils are equal, round, and reactive to light.       Eye exam difficult due to photophobia.  Her pupils react equally  Neck: Normal range of motion.  Cardiovascular: Regular rhythm.  Tachycardia present.   Pulmonary/Chest: Effort normal.  Musculoskeletal: Normal range of motion. She exhibits no edema.  Neurological: She is oriented to person, place, and time.  Skin:  Skin is warm and dry. No rash noted.  Psychiatric: Her behavior is normal.    ED Course  Procedures (including critical care time)  Labs Reviewed - No data to display No results found.   1. Migraine headache       MDM  I've asked that she be given, Reglan, and Benadryl, and Decadron, to do, to her ibuprofen, allergy, which causes a rash.  We'll reassess and hold off on administering her clinics as a last resort Patient, reports she's had no relief from her headache discomfort, will ask that she be received half a milligram of Dilaudid, and 4 mg of Zofran  Headache, resolved      Arman Filter, NP 08/15/12 0038

## 2012-08-14 NOTE — ED Notes (Signed)
Family at bedside. 

## 2012-08-14 NOTE — ED Notes (Signed)
Pt here for migraine behind her eyes starting this afternoon; pt sts hx of same

## 2012-08-14 NOTE — ED Notes (Signed)
Pt reports sudden onset of a migraine headache behind her eyes and sensitivity to lightstarting today approx 1430. Pt reports taking her prescribed medication for her migraine w/no relief, called her neurologist and they sent her to ED for further evaluation.

## 2012-08-15 NOTE — ED Provider Notes (Signed)
Medical screening examination/treatment/procedure(s) were performed by non-physician practitioner and as supervising physician I was immediately available for consultation/collaboration.   Carleene Cooper III, MD 08/15/12 [phone]

## 2012-08-15 NOTE — ED Notes (Signed)
Pt requesting to go home, will inform [facility] FNP

## 2012-09-20 ENCOUNTER — Emergency Department (HOSPITAL_COMMUNITY)
Admission: EM | Admit: 2012-09-20 | Discharge: 2012-09-21 | Disposition: A | Discharge status: Home Health | Payer: Medicare Other | Attending: Emergency Medicine | Admitting: Emergency Medicine

## 2012-09-20 DIAGNOSIS — Z9851 Tubal ligation status: Secondary | ICD-10-CM | POA: Insufficient documentation

## 2012-09-20 DIAGNOSIS — F411 Generalized anxiety disorder: Secondary | ICD-10-CM | POA: Insufficient documentation

## 2012-09-20 DIAGNOSIS — R109 Unspecified abdominal pain: Secondary | ICD-10-CM | POA: Insufficient documentation

## 2012-09-20 DIAGNOSIS — G40909 Epilepsy, unspecified, not intractable, without status epilepticus: Secondary | ICD-10-CM | POA: Insufficient documentation

## 2012-09-20 DIAGNOSIS — F17200 Nicotine dependence, unspecified, uncomplicated: Secondary | ICD-10-CM | POA: Insufficient documentation

## 2012-09-20 DIAGNOSIS — G43909 Migraine, unspecified, not intractable, without status migrainosus: Secondary | ICD-10-CM | POA: Insufficient documentation

## 2012-09-20 DIAGNOSIS — Z79899 Other long term (current) drug therapy: Secondary | ICD-10-CM | POA: Insufficient documentation

## 2012-09-20 LAB — COMPREHENSIVE METABOLIC PANEL
ALT: 16 U/L (ref 0–35)
AST: 19 U/L (ref 0–37)
Albumin: 3.9 g/dL (ref 3.5–5.2)
BUN: 5 mg/dL — ABNORMAL LOW (ref 6–23)
CO2: 23 mEq/L (ref 19–32)
Glucose, Bld: 82 mg/dL (ref 70–99)
Potassium: 3.9 mEq/L (ref 3.5–5.1)

## 2012-09-20 LAB — CBC WITH DIFFERENTIAL/PLATELET
Eosinophils Absolute: 0.1 10*3/uL (ref 0.0–0.7)
Hemoglobin: 14.9 g/dL (ref 12.0–15.0)
Lymphocytes Relative: 37 % (ref 12–46)
Lymphs Abs: 3.5 10*3/uL (ref 0.7–4.0)
MCH: 32.6 pg (ref 26.0–34.0)
MCV: 95.2 fL (ref 78.0–100.0)
Monocytes Relative: 5 % (ref 3–12)
Neutrophils Relative %: 58 % (ref 43–77)
RBC: 4.57 MIL/uL (ref 3.87–5.11)
WBC: 9.5 10*3/uL (ref 4.0–10.5)

## 2012-09-20 LAB — URINALYSIS, ROUTINE W REFLEX MICROSCOPIC
Glucose, UA: NEGATIVE mg/dL
Leukocytes, UA: NEGATIVE
pH: 6 (ref 5.0–8.0)

## 2012-09-20 NOTE — ED Notes (Signed)
Pt states right side flank pain with N/V/D. Denies difficulty voiding, and hematuria, or hx kidney stones. Pt states she was seen in [facility] yesterday. Last BM this morning, LMP 3 weeks ago.

## 2012-09-20 NOTE — ED Notes (Signed)
Triage assessment by Gena Fray

## 2012-09-21 ENCOUNTER — Emergency Department (HOSPITAL_COMMUNITY): Payer: Medicare Other

## 2012-09-21 LAB — WET PREP, GENITAL
Clue Cells Wet Prep HPF POC: NONE SEEN
Trich, Wet Prep: NONE SEEN

## 2012-09-21 LAB — GC/CHLAMYDIA PROBE AMP
CT Probe RNA: NEGATIVE
GC Probe RNA: NEGATIVE

## 2012-09-21 MED ORDER — ONDANSETRON HCL 8 MG PO TABS: 8.0000 mg | ORAL_TABLET | Freq: Three times a day (TID) | ORAL | Status: DC | PRN

## 2012-09-21 MED ORDER — IOHEXOL 300 MG/ML  SOLN
100.0000 mL | Freq: Once | INTRAMUSCULAR | Status: AC | PRN
  Administered 2012-09-21: 100 mL via INTRAVENOUS

## 2012-09-21 MED ORDER — OXYCODONE-ACETAMINOPHEN 5-325 MG PO TABS: 1.0000 | ORAL_TABLET | ORAL | Status: DC | PRN

## 2012-09-21 MED ORDER — HYDROMORPHONE HCL PF 1 MG/ML IJ SOLN
1.0000 mg | Freq: Once | INTRAMUSCULAR | Status: AC
  Administered 2012-09-21: 1 mg via INTRAVENOUS
  Filled 2012-09-21: qty 1

## 2012-09-21 MED ORDER — MORPHINE SULFATE 4 MG/ML IJ SOLN
4.0000 mg | Freq: Once | INTRAMUSCULAR | Status: AC
  Administered 2012-09-21: 4 mg via INTRAVENOUS
  Filled 2012-09-21: qty 1

## 2012-09-21 MED ORDER — ONDANSETRON HCL 4 MG/2ML IJ SOLN
4.0000 mg | Freq: Once | INTRAMUSCULAR | Status: AC
  Administered 2012-09-21: 4 mg via INTRAVENOUS
  Filled 2012-09-21: qty 2

## 2012-09-21 MED ORDER — IOHEXOL 300 MG/ML  SOLN
50.0000 mL | Freq: Once | INTRAMUSCULAR | Status: AC | PRN
  Administered 2012-09-21: 50 mL via ORAL

## 2012-09-21 MED ORDER — IOHEXOL 300 MG/ML  SOLN: 20.0000 mL | INTRAMUSCULAR | Status: DC

## 2012-09-21 NOTE — ED Notes (Signed)
TRANSPORTED TO CT SCAN.  

## 2012-09-21 NOTE — ED Provider Notes (Signed)
Medical screening examination/treatment/procedure(s) were performed by non-physician practitioner and as supervising physician I was immediately available for consultation/collaboration.  Darion Juhasz M Damarious Holtsclaw, MD 09/21/12 0625 

## 2012-09-21 NOTE — ED Provider Notes (Signed)
History     CSN: [id number]  Arrival date & time 09/20/12  5621   First MD Initiated Contact with Patient 09/20/12 2322      Chief Complaint  Patient presents with  . Flank Pain    (Consider location/radiation/quality/duration/timing/severity/associated sxs/prior treatment) HPI History provided by pt.   Pt c/o right side pain for the past two days.  Constant and severe.  Aggravated by eating.  Associated w/ N/V/D.  Denies fever, urinary and vaginal sx, and hematemesis/hematochezia/melena.  Past abd surgeries included cholecystectomy and three c-sections.     Past Medical History  Diagnosis Date  . Migraine   . Seizures   . Anxiety     Past Surgical History  Procedure Date  . Cesarean section   . Cholecystectomy   . Tubal ligation   . Endometrial ablation     No family history on file.  History  Substance Use Topics  . Smoking status: Current Every Day Smoker -- 1.5 packs/day    Types: Cigarettes  . Smokeless tobacco: Not on file  . Alcohol Use: No    OB History    Grav Para Term Preterm Abortions TAB SAB Ect Mult Living                  Review of Systems  All other systems reviewed and are negative.    Allergies  Demerol and Ibuprofen  Home Medications   Current Outpatient Rx  Name  Route  Sig  Dispense  Refill  . ALBUTEROL SULFATE HFA 108 (90 BASE) MCG/ACT IN AERS   Inhalation   Inhale 2 puffs into the lungs every 6 (six) hours as needed. For shortness of breath         . ASPIRIN-ACETAMINOPHEN 500-325 MG PO PACK   Oral   Take 1 packet by mouth daily as needed. As needed for pain.         Marland Kitchen CHLORPROMAZINE HCL 25 MG PO TABS   Oral   Take 25 mg by mouth 4 (four) times daily. Scheduled doses for anxiety         . CHLORPROMAZINE HCL 50 MG PO TABS   Oral   Take 150 mg by mouth at bedtime. sleep         . CLONAZEPAM 0.5 MG PO TABS   Oral   Take 0.5 mg by mouth 3 (three) times daily as needed. For anxiety and 2 tablet at bed time          . GABAPENTIN 600 MG PO TABS   Oral   Take 600 mg by mouth 3 (three) times daily.         Marland Kitchen LEVETIRACETAM 1000 MG PO TABS   Oral   Take 1,000 mg by mouth 2 (two) times daily.         . SUMATRIPTAN SUCCINATE 100 MG PO TABS   Oral   Take 100 mg by mouth every 2 (two) hours as needed. For migrain         . VENLAFAXINE HCL ER 150 MG PO CP24   Oral   Take 150 mg by mouth daily.           BP 131/49  Pulse 56  Temp 98.1 F (36.7 C) (Oral)  Resp 15  SpO2 100%  LMP 08/31/2012  Physical Exam  Nursing note and vitals reviewed. Constitutional: She is oriented to person, place, and time. She appears well-developed and well-nourished. No distress.       Uncomfortable  appearing  HENT:  Head: Normocephalic and atraumatic.  Eyes:       Normal appearance  Neck: Normal range of motion.  Cardiovascular: Normal rate and regular rhythm.   Pulmonary/Chest: Effort normal and breath sounds normal. No respiratory distress.  Abdominal: Soft. Bowel sounds are normal. She exhibits no distension and no mass. There is no rebound and no guarding.       Entire right side of abdomen is ttp, particularly RLQ.    Genitourinary:       Nml external genitalia.  No vaginal discharge/bleeding.  Cervix closed and appears nml.  No adnexal or cervical motion tenderess.  No CVA tenderness  Musculoskeletal: Normal range of motion.  Neurological: She is alert and oriented to person, place, and time.  Skin: Skin is warm and dry. No rash noted.  Psychiatric: She has a normal mood and affect. Her behavior is normal.    ED Course  Procedures (including critical care time)  Labs Reviewed  COMPREHENSIVE METABOLIC PANEL - Abnormal; Notable for the following:    BUN 5 (*)     Total Bilirubin 0.1 (*)     All other components within normal limits  URINALYSIS, ROUTINE W REFLEX MICROSCOPIC - Abnormal; Notable for the following:    APPearance HAZY (*)     All other components within normal limits  CBC WITH  DIFFERENTIAL  LIPASE, BLOOD   Ct Abdomen Pelvis W Contrast  09/21/2012  *RADIOLOGY REPORT*  Clinical Data: Flank pain, right-sided abdominal pain.  CT ABDOMEN AND PELVIS WITH CONTRAST  Technique:  Multidetector CT imaging of the abdomen and pelvis was performed following the standard protocol during bolus administration of intravenous contrast.  Contrast: OMNIPAQUE IOHEXOL 300 MG/ML  SOLN  Comparison: 07/27/2012  Findings: Linear opacities within the lower lobes, middle lobe, and lingula.  Heart size within normal limits.  Low attenuation of the liver while nonspecific post contrast is often seen in the setting of steatosis.  Cholecystectomy.  No biliary ductal dilatation.  Unremarkable spleen, pancreas, adrenal glands.  Subcentimeter hypodensity within the upper pole left kidney, favored to represent a cyst.  Otherwise, symmetric renal enhancement.  No hydronephrosis or hydroureter.  No CT evidence for colitis.  Normal appendix. No bowel obstruction. No free intraperitoneal air or fluid.  No lymphadenopathy.  Normal caliber aorta and branch vessels.  Thin-walled, distended bladder.  Unremarkable CT appearance to the uterus and adnexa.  Physiologic appearing left adnexal cyst.  No acute osseous finding.  IMPRESSION: Bibasilar opacities are nonspecific; favor scarring and/or atelectasis.  Normal appendix.  No acute abdominopelvic process identified by CT.   Original Report Authenticated By: Jearld Lesch, M.D.      1. Abdominal pain       MDM  [phone] F presents w/ right-sided abdominal pain + N/V/D x 2 days.  H/o cholecystectomy.  Afebrile, uncomfortable appearing, entire right side of abdomen, particularly RLQ ttp, nml genitalia on exam.  Labs unremarkable.  CT abd/pelvis neg for appendicitis or other acute abnormality.  Results discussed w/ pt.  Her pain is improved after receiving IV dilaudid and she is tolerating pos.  D/c'd home w/ percocet and zofran.  Recommended f/u with her PCP and  return for fever, worsening pain or uncontrolled vomiting.         Otilio Miu, PA-C 09/21/12 [phone]

## 2012-09-29 ENCOUNTER — Encounter (HOSPITAL_COMMUNITY): Payer: Self-pay

## 2012-09-29 ENCOUNTER — Emergency Department (HOSPITAL_COMMUNITY)
Admission: EM | Admit: 2012-09-29 | Discharge: 2012-09-29 | Disposition: A | Discharge status: Home / Self Care | Payer: Medicare Other | Attending: Emergency Medicine | Admitting: Emergency Medicine

## 2012-09-29 ENCOUNTER — Emergency Department (HOSPITAL_COMMUNITY): Payer: Medicare Other

## 2012-09-29 DIAGNOSIS — R300 Dysuria: Secondary | ICD-10-CM | POA: Insufficient documentation

## 2012-09-29 DIAGNOSIS — R059 Cough, unspecified: Secondary | ICD-10-CM | POA: Insufficient documentation

## 2012-09-29 DIAGNOSIS — Z79899 Other long term (current) drug therapy: Secondary | ICD-10-CM | POA: Insufficient documentation

## 2012-09-29 DIAGNOSIS — G43909 Migraine, unspecified, not intractable, without status migrainosus: Secondary | ICD-10-CM | POA: Insufficient documentation

## 2012-09-29 DIAGNOSIS — M549 Dorsalgia, unspecified: Secondary | ICD-10-CM | POA: Insufficient documentation

## 2012-09-29 DIAGNOSIS — R112 Nausea with vomiting, unspecified: Secondary | ICD-10-CM | POA: Insufficient documentation

## 2012-09-29 DIAGNOSIS — F17200 Nicotine dependence, unspecified, uncomplicated: Secondary | ICD-10-CM | POA: Insufficient documentation

## 2012-09-29 DIAGNOSIS — R109 Unspecified abdominal pain: Secondary | ICD-10-CM

## 2012-09-29 DIAGNOSIS — Z7982 Long term (current) use of aspirin: Secondary | ICD-10-CM | POA: Insufficient documentation

## 2012-09-29 DIAGNOSIS — F411 Generalized anxiety disorder: Secondary | ICD-10-CM | POA: Insufficient documentation

## 2012-09-29 DIAGNOSIS — G40909 Epilepsy, unspecified, not intractable, without status epilepticus: Secondary | ICD-10-CM | POA: Insufficient documentation

## 2012-09-29 DIAGNOSIS — Z9089 Acquired absence of other organs: Secondary | ICD-10-CM | POA: Insufficient documentation

## 2012-09-29 DIAGNOSIS — R079 Chest pain, unspecified: Secondary | ICD-10-CM | POA: Insufficient documentation

## 2012-09-29 DIAGNOSIS — R05 Cough: Secondary | ICD-10-CM | POA: Insufficient documentation

## 2012-09-29 LAB — URINALYSIS, ROUTINE W REFLEX MICROSCOPIC
Bilirubin Urine: NEGATIVE
Hgb urine dipstick: NEGATIVE
Nitrite: NEGATIVE
Specific Gravity, Urine: 1.005 (ref 1.005–1.030)
pH: 6.5 (ref 5.0–8.0)

## 2012-09-29 LAB — COMPREHENSIVE METABOLIC PANEL
ALT: 13 U/L (ref 0–35)
AST: 14 U/L (ref 0–37)
Calcium: 9.1 mg/dL (ref 8.4–10.5)
Creatinine, Ser: 0.77 mg/dL (ref 0.50–1.10)
Sodium: 136 mEq/L (ref 135–145)
Total Protein: 7.2 g/dL (ref 6.0–8.3)

## 2012-09-29 MED ORDER — OXYCODONE-ACETAMINOPHEN 5-325 MG PO TABS: 1.0000 | ORAL_TABLET | Freq: Three times a day (TID) | ORAL | Status: DC | PRN

## 2012-09-29 MED ORDER — ONDANSETRON 4 MG PO TBDP: 4.0000 mg | ORAL_TABLET | Freq: Once | ORAL | Status: DC

## 2012-09-29 MED ORDER — OXYCODONE-ACETAMINOPHEN 5-325 MG PO TABS
1.0000 | ORAL_TABLET | Freq: Once | ORAL | Status: AC
  Administered 2012-09-29: 1 via ORAL
  Filled 2012-09-29: qty 1

## 2012-09-29 MED ORDER — ONDANSETRON 4 MG PO TBDP
4.0000 mg | ORAL_TABLET | Freq: Once | ORAL | Status: AC
  Administered 2012-09-29: 4 mg via ORAL
  Filled 2012-09-29: qty 1

## 2012-09-29 NOTE — ED Notes (Signed)
Pt presents with continued R sided abdominal pain.  Pt seen here x 1 week ago for same and was told to return if pain worsened.  Pt reports pain is sharp, stabbing and radiates into R flank.  +nausea and vomiting.

## 2012-09-29 NOTE — ED Notes (Signed)
Rx x 2.  Pt voiced understanding to f/u with PCP and return for worsening condition.  

## 2012-09-29 NOTE — ED Provider Notes (Signed)
History     CSN: [id number]  Arrival date & time 09/29/12  1338   First MD Initiated Contact with Patient 09/29/12 1547      Chief Complaint  Patient presents with  . Back Pain    (Consider location/radiation/quality/duration/timing/severity/associated sxs/prior treatment) HPI Comments: Patient presents today with a chief complaint of RUQ abdominal pain and right lower rib pain.  Pain has been present for the past 8 days.  The pain has been constant.  She describes the pain as a sharp stabbing pain.  Pain radiates to her right flank area.  She was seen in the ED on 09/23/11 for similar pain.  However, at that time the pain was primarily in the RLQ.  She had a CT ab/pelvis with contrast done at the time, which was negative aside from some bibasilar opacities thought to be atelectasis.  She had one episode of vomiting today and one episode of vomiting last week.  No other vomiting.  She denies diarrhea.  She has been having regular bowel movements.  She denies any chest pain or SOB.  She does report that she has had some pain with urination.   She denies increased urinary frequency or urgency. She denies hematuria.  Denies fever or chills.  PMH significant for Cholecystectomy in 2006.    Patient is a 37 y.o. female presenting with back pain. The history is provided by the patient.  Back Pain  Associated symptoms include abdominal pain and dysuria. Pertinent negatives include no chest pain and no fever.  Back Pain Associated symptoms: abdominal pain and dysuria   Associated symptoms: no chest pain and no fever     Past Medical History  Diagnosis Date  . Migraine   . Seizures   . Anxiety     Past Surgical History  Procedure Date  . Cesarean section   . Cholecystectomy   . Tubal ligation   . Endometrial ablation     History reviewed. No pertinent family history.  History  Substance Use Topics  . Smoking status: Current Every Day Smoker -- 1.5 packs/day    Types: Cigarettes  .  Smokeless tobacco: Not on file  . Alcohol Use: No    OB History    Grav Para Term Preterm Abortions TAB SAB Ect Mult Living                  Review of Systems  Constitutional: Negative for fever and chills.  Respiratory: Positive for cough. Negative for shortness of breath and wheezing.   Cardiovascular: Negative for chest pain.  Gastrointestinal: Positive for nausea, vomiting and abdominal pain. Negative for diarrhea and constipation.  Genitourinary: Positive for dysuria. Negative for urgency and frequency.  Musculoskeletal: Positive for back pain.  All other systems reviewed and are negative.    Allergies  Demerol and Ibuprofen  Home Medications   Current Outpatient Rx  Name  Route  Sig  Dispense  Refill  . ALBUTEROL SULFATE HFA 108 (90 BASE) MCG/ACT IN AERS   Inhalation   Inhale 2 puffs into the lungs every 6 (six) hours as needed. For shortness of breath         . ASPIRIN-ACETAMINOPHEN 500-325 MG PO PACK   Oral   Take 1 packet by mouth daily as needed. As needed for pain.         Marland Kitchen CHLORPROMAZINE HCL 25 MG PO TABS   Oral   Take 25 mg by mouth 4 (four) times daily. Scheduled doses for anxiety         .  CHLORPROMAZINE HCL 50 MG PO TABS   Oral   Take 150 mg by mouth at bedtime. sleep         . CLONAZEPAM 0.5 MG PO TABS   Oral   Take 0.5-1 mg by mouth 3 (three) times daily as needed. For anxiety and 2 tablet at bed time         . GABAPENTIN 600 MG PO TABS   Oral   Take 600 mg by mouth 3 (three) times daily.         Marland Kitchen LEVETIRACETAM 1000 MG PO TABS   Oral   Take 1,000 mg by mouth 2 (two) times daily.         Marland Kitchen ONDANSETRON HCL 8 MG PO TABS   Oral   Take 1 tablet (8 mg total) by mouth every 8 (eight) hours as needed for nausea.   20 tablet   0   . SUMATRIPTAN SUCCINATE 100 MG PO TABS   Oral   Take 100 mg by mouth every 2 (two) hours as needed. For migraine         . VENLAFAXINE HCL ER 150 MG PO CP24   Oral   Take 150 mg by mouth daily.            BP 106/68  Pulse 100  Temp 98.4 F (36.9 C) (Oral)  Resp 16  SpO2 99%  LMP 09/01/2012  Physical Exam  Nursing note and vitals reviewed. Constitutional: She appears well-developed and well-nourished. No distress.  HENT:  Head: Normocephalic and atraumatic.  Mouth/Throat: Oropharynx is clear and moist.  Neck: Normal range of motion. Neck supple.  Cardiovascular: Normal rate and normal heart sounds.   Pulmonary/Chest: Effort normal and breath sounds normal. No respiratory distress. She has no wheezes. She has no rales.  Abdominal: Soft. Normal appearance and bowel sounds are normal. She exhibits no distension and no mass. There is tenderness in the right upper quadrant. There is no rigidity, no rebound, no guarding and no CVA tenderness.    Musculoskeletal: Normal range of motion.  Neurological: She is alert.  Skin: Skin is warm and dry. She is not diaphoretic.  Psychiatric: She has a normal mood and affect.    ED Course  Procedures (including critical care time)   Labs Reviewed  COMPREHENSIVE METABOLIC PANEL  URINALYSIS, ROUTINE W REFLEX MICROSCOPIC   No results found.   No diagnosis found.  Patient discussed with Dr. Jeraldine Loots.  MDM  Patient presenting with RUQ abdominal pain.  Patient had a Cholecystectomy performed 8 years ago.  UA and CMP unremarkable.  Patient had a CT of abdomen and pelvis performed one week ago, which was negative.  Do not feel that further imaging is needed at this time.          Pascal Lux [place], PA-C 09/30/12 1935

## 2012-09-30 NOTE — ED Provider Notes (Signed)
  Medical screening examination/treatment/procedure(s) were performed by non-physician practitioner and as supervising physician I was immediately available for consultation/collaboration.  On my exam the patient was in no distress.  We discussed all findings at length.  Given the thorough investigation between today and last week, there is low suspicion for acute pathology, though with the continued pain, the patient was referred to her primary care physician after provision of analgesics  I saw the ECG (if appropriate), relevant labs and studies - I agree with the interpretation.    Gerhard Munch, MD 09/30/12 2324

## 2012-10-18 ENCOUNTER — Emergency Department (HOSPITAL_COMMUNITY): Payer: Medicare Other

## 2012-10-18 ENCOUNTER — Emergency Department (HOSPITAL_COMMUNITY)
Admission: EM | Admit: 2012-10-18 | Discharge: 2012-10-19 | Disposition: A | Discharge status: Home / Self Care | Payer: Medicare Other | Attending: Emergency Medicine | Admitting: Emergency Medicine

## 2012-10-18 ENCOUNTER — Encounter (HOSPITAL_COMMUNITY): Payer: Self-pay | Admitting: Emergency Medicine

## 2012-10-18 DIAGNOSIS — Z79899 Other long term (current) drug therapy: Secondary | ICD-10-CM | POA: Insufficient documentation

## 2012-10-18 DIAGNOSIS — Z7982 Long term (current) use of aspirin: Secondary | ICD-10-CM | POA: Insufficient documentation

## 2012-10-18 DIAGNOSIS — R296 Repeated falls: Secondary | ICD-10-CM | POA: Insufficient documentation

## 2012-10-18 DIAGNOSIS — S0993XA Unspecified injury of face, initial encounter: Secondary | ICD-10-CM | POA: Insufficient documentation

## 2012-10-18 DIAGNOSIS — Y929 Unspecified place or not applicable: Secondary | ICD-10-CM | POA: Insufficient documentation

## 2012-10-18 DIAGNOSIS — G40909 Epilepsy, unspecified, not intractable, without status epilepticus: Secondary | ICD-10-CM | POA: Insufficient documentation

## 2012-10-18 DIAGNOSIS — F17200 Nicotine dependence, unspecified, uncomplicated: Secondary | ICD-10-CM | POA: Insufficient documentation

## 2012-10-18 DIAGNOSIS — S59909A Unspecified injury of unspecified elbow, initial encounter: Secondary | ICD-10-CM | POA: Insufficient documentation

## 2012-10-18 DIAGNOSIS — Y9389 Activity, other specified: Secondary | ICD-10-CM | POA: Insufficient documentation

## 2012-10-18 DIAGNOSIS — G43909 Migraine, unspecified, not intractable, without status migrainosus: Secondary | ICD-10-CM | POA: Insufficient documentation

## 2012-10-18 DIAGNOSIS — F411 Generalized anxiety disorder: Secondary | ICD-10-CM | POA: Insufficient documentation

## 2012-10-18 DIAGNOSIS — S6990XA Unspecified injury of unspecified wrist, hand and finger(s), initial encounter: Secondary | ICD-10-CM | POA: Insufficient documentation

## 2012-10-18 NOTE — ED Notes (Addendum)
Patient reports that she had a seizure earlier today (fell down during seizure) and was taken to [facility] ED by EMS.  Patient is currently complaining of right facial pain and right wrist pain from the fall and states that they did not do anything for her pain at ED.  Patient reports that she was given Keppra at ED and that she has been taking her medications on a daily basis.

## 2012-10-18 NOTE — ED Provider Notes (Signed)
History  This chart was scribed for non-physician practitioner working with Richardean Canal, MD by Ardeen Jourdain, ED Scribe. This patient was seen in room [facility] and the patient's care was started at 2219.  CSN: [id number]  Arrival date & time 10/18/12  2117   None     Chief Complaint  Patient presents with  . Facial Pain  . Wrist Pain     Patient is a 37 y.o. female presenting with wrist pain. The history is provided by the patient. No language interpreter was used.  Wrist Pain This is a new problem. The current episode started 6 to 12 hours ago. The problem occurs constantly. The problem has not changed since onset.Pertinent negatives include no chest pain, no abdominal pain, no headaches and no shortness of breath. The symptoms are aggravated by twisting. Nothing relieves the symptoms. She has tried nothing for the symptoms.    Brianna Merritt is a 37 y.o. female who presents to the Emergency Department complaining of right wrist and right facial pain from a fall earlier. She states she had a seizure earlier today causing her to fall on the floor. She states she could have fallen on her wrist but is not sure. She states she was seen and treated for the seizure at [facility] but was not treated for the pain. She states she has been taking her medications as prescribed. She states she is unsure why the seizure occurred. She reports not knowing the seizure was about to occur. She reports having cold symptoms but other than that not being sick recently. She states she had a CT of her head done at [facility]. She reports receiving Tylenol and Keppra at [facility] today.   Past Medical History  Diagnosis Date  . Migraine   . Seizures   . Anxiety     Past Surgical History  Procedure Laterality Date  . Cesarean section    . Cholecystectomy    . Tubal ligation    . Endometrial ablation      History reviewed. No pertinent family history.  History  Substance Use  Topics  . Smoking status: Current Every Day Smoker -- 1.50 packs/day    Types: Cigarettes  . Smokeless tobacco: Not on file  . Alcohol Use: No   No OB history available.   Review of Systems  Constitutional: Negative for diaphoresis.  HENT: Negative for neck pain and neck stiffness.        Right facial pain  Respiratory: Negative for shortness of breath.   Cardiovascular: Negative for chest pain.  Gastrointestinal: Negative for abdominal pain.  Musculoskeletal:       Right wrist pain  Skin: Negative for wound.  Neurological: Positive for seizures (Occured earlier today). Negative for dizziness, syncope, weakness and headaches.  All other systems reviewed and are negative.    Allergies  Demerol and Ibuprofen  Home Medications   Current Outpatient Rx  Name  Route  Sig  Dispense  Refill  . albuterol (PROVENTIL HFA;VENTOLIN HFA) 108 (90 BASE) MCG/ACT inhaler   Inhalation   Inhale 2 puffs into the lungs every 6 (six) hours as needed. For shortness of breath         . Aspirin-Acetaminophen (GOODY BODY PAIN) 500-325 MG PACK   Oral   Take 1 packet by mouth daily as needed. As needed for pain.         . chlorproMAZINE (THORAZINE) 25 MG tablet   Oral   Take 25 mg  by mouth 4 (four) times daily. Scheduled doses for anxiety         . chlorproMAZINE (THORAZINE) 50 MG tablet   Oral   Take 150 mg by mouth at bedtime. sleep         . clonazePAM (KLONOPIN) 0.5 MG tablet   Oral   Take 0.5-1 mg by mouth 3 (three) times daily as needed. For anxiety and 2 tablet at bed time         . gabapentin (NEURONTIN) 600 MG tablet   Oral   Take 600 mg by mouth 3 (three) times daily.         Marland Kitchen levETIRAcetam (KEPPRA) 1000 MG tablet   Oral   Take 1,000 mg by mouth 2 (two) times daily.         . ondansetron (ZOFRAN) 8 MG tablet   Oral   Take 1 tablet (8 mg total) by mouth every 8 (eight) hours as needed for nausea.   20 tablet   0   . SUMAtriptan (IMITREX) 100 MG tablet    Oral   Take 100 mg by mouth every 2 (two) hours as needed. For migraine         . venlafaxine XR (EFFEXOR-XR) 150 MG 24 hr capsule   Oral   Take 150 mg by mouth daily.           Triage Vitals: BP 107/71  Pulse 129  Temp(Src) 97.8 F (36.6 C) (Oral)  Resp 16  SpO2 95%  LMP 10/02/2012  Physical Exam  Nursing note and vitals reviewed. Constitutional: She is oriented to person, place, and time. She appears well-developed and well-nourished. No distress.  HENT:  Head: Normocephalic and atraumatic.  Tenderness around right orbit. No swelling, instability or ecchymosis   Eyes: Conjunctivae and EOM are normal. Pupils are equal, round, and reactive to light. Right eye exhibits no discharge. Left eye exhibits no discharge. No scleral icterus.  Neck: Normal range of motion. Neck supple. No tracheal deviation present.  Cardiovascular: Normal rate, regular rhythm and normal heart sounds.  Exam reveals no gallop and no friction rub.   No murmur heard. Pulmonary/Chest: Effort normal and breath sounds normal. No respiratory distress. She has no wheezes.  Abdominal: Soft. Bowel sounds are normal. She exhibits no distension.  Musculoskeletal: Normal range of motion. She exhibits no edema.  Tenderness on the radial side of right wrist, no obvious swelling, good ROM  Neurological: She is alert and oriented to person, place, and time.  Skin: Skin is warm and dry. She is not diaphoretic.  Psychiatric: She has a normal mood and affect. Her behavior is normal.    ED Course  Procedures (including critical care time)  DIAGNOSTIC STUDIES: Oxygen Saturation is 95% on room air, adequate by my interpretation.    COORDINATION OF CARE:  10:51 PM: Discussed treatment plan which includes x-ray of the right wrist and pain medication with pt at bedside and pt agreed to plan.     Labs Reviewed - No data to display Dg Wrist Complete Right  10/18/2012  *RADIOLOGY REPORT*  Clinical Data: Seizure and  fall tonight.  Pain in the right wrist.  RIGHT WRIST - COMPLETE 3+ VIEW  Comparison: None.  Findings: The right wrist appears intact. No evidence of acute fracture or subluxation.  No focal bone lesions.  Bone matrix and cortex appear intact.  No abnormal radiopaque densities in the soft tissues.  IMPRESSION: No acute bony abnormalities demonstrated in the right wrist.  Original Report Authenticated By: Burman Nieves, M.D.      No diagnosis found.  Records from [facility] received.  Evaluation of facial and wrist injury are documented.  Labs and radiology results reviewed.  No indication of intracranial abnormality.  MDM   I personally performed the services described in this documentation, which was scribed in my presence. The recorded information has been reviewed and is accurate.        Jimmye Norman, NP 10/19/12 [phone]

## 2012-10-19 MED ORDER — HYDROCODONE-ACETAMINOPHEN 5-325 MG PO TABS: 1.0000 | ORAL_TABLET | Freq: Four times a day (QID) | ORAL | Status: DC | PRN

## 2012-10-19 NOTE — ED Notes (Signed)
Pt dc'd home w/all belongings and 1 new rx, pt alert and ambulatory upon dc, pt denies any questions in regard to dc instructions

## 2012-10-19 NOTE — ED Provider Notes (Signed)
Medical screening examination/treatment/procedure(s) were performed by non-physician practitioner and as supervising physician I was immediately available for consultation/collaboration.   Richardean Canal, MD 10/19/12 [phone]

## 2012-10-25 ENCOUNTER — Encounter (HOSPITAL_COMMUNITY): Payer: Self-pay | Admitting: *Deleted

## 2012-10-25 ENCOUNTER — Emergency Department (HOSPITAL_COMMUNITY)
Admission: EM | Admit: 2012-10-25 | Discharge: 2012-10-26 | Disposition: A | Discharge status: Home / Self Care | Payer: Medicare Other | Attending: Emergency Medicine | Admitting: Emergency Medicine

## 2012-10-25 DIAGNOSIS — Z79899 Other long term (current) drug therapy: Secondary | ICD-10-CM | POA: Insufficient documentation

## 2012-10-25 DIAGNOSIS — R000 Tachycardia, unspecified: Secondary | ICD-10-CM | POA: Insufficient documentation

## 2012-10-25 DIAGNOSIS — H538 Other visual disturbances: Secondary | ICD-10-CM | POA: Insufficient documentation

## 2012-10-25 DIAGNOSIS — G40909 Epilepsy, unspecified, not intractable, without status epilepticus: Secondary | ICD-10-CM | POA: Insufficient documentation

## 2012-10-25 DIAGNOSIS — J3489 Other specified disorders of nose and nasal sinuses: Secondary | ICD-10-CM | POA: Insufficient documentation

## 2012-10-25 DIAGNOSIS — F411 Generalized anxiety disorder: Secondary | ICD-10-CM | POA: Insufficient documentation

## 2012-10-25 DIAGNOSIS — G43909 Migraine, unspecified, not intractable, without status migrainosus: Secondary | ICD-10-CM | POA: Insufficient documentation

## 2012-10-25 DIAGNOSIS — F17200 Nicotine dependence, unspecified, uncomplicated: Secondary | ICD-10-CM | POA: Insufficient documentation

## 2012-10-25 MED ORDER — DEXAMETHASONE SODIUM PHOSPHATE 10 MG/ML IJ SOLN
10.0000 mg | Freq: Once | INTRAMUSCULAR | Status: AC
  Administered 2012-10-25: 10 mg via INTRAVENOUS
  Filled 2012-10-25: qty 1

## 2012-10-25 MED ORDER — DROPERIDOL 2.5 MG/ML IJ SOLN: 2.5000 mg | Freq: Once | INTRAMUSCULAR | Status: DC

## 2012-10-25 MED ORDER — PROCHLORPERAZINE EDISYLATE 5 MG/ML IJ SOLN
10.0000 mg | Freq: Once | INTRAMUSCULAR | Status: AC
  Administered 2012-10-25: 10 mg via INTRAVENOUS
  Filled 2012-10-25: qty 2

## 2012-10-25 MED ORDER — DIPHENHYDRAMINE HCL 50 MG/ML IJ SOLN
25.0000 mg | Freq: Once | INTRAMUSCULAR | Status: DC
  Filled 2012-10-25: qty 1

## 2012-10-25 MED ORDER — MAGNESIUM SULFATE 40 MG/ML IJ SOLN
2.0000 g | Freq: Once | INTRAMUSCULAR | Status: AC
  Administered 2012-10-25: 2 g via INTRAVENOUS
  Filled 2012-10-25: qty 50

## 2012-10-25 MED ORDER — DIPHENHYDRAMINE HCL 50 MG/ML IJ SOLN
25.0000 mg | Freq: Once | INTRAMUSCULAR | Status: AC
  Administered 2012-10-25: 25 mg via INTRAVENOUS

## 2012-10-25 MED ORDER — SODIUM CHLORIDE 0.9 % IV BOLUS (SEPSIS)
1000.0000 mL | Freq: Once | INTRAVENOUS | Status: AC
  Administered 2012-10-25: 1000 mL via INTRAVENOUS

## 2012-10-25 NOTE — ED Provider Notes (Signed)
History     CSN: [id number]  Arrival date & time 10/25/12  1543   First MD Initiated Contact with Patient 10/25/12 2005      Chief Complaint  Patient presents with  . Headache    (Consider location/radiation/quality/duration/timing/severity/associated sxs/prior treatment) HPI History provided by pt.   Pt has h/o migraines.  Presents w/ severe pain above right eye x 3 days.  Associated w/ nasal congestion, blurred vision on right side, photo/phonophobia and nausea.  Had a seizure 1 week ago and hit her head, was evaluated at [facility] and had a negative head CT.  Pain today is in a different location than typical and she does not usually have blurred vision associated w/ it.  However, has been treated for migraine multiple times in recent past and complained of pain in the same location, once w/ associated visual disturbance in the past.  Has taken imitrex w/out relief.  Past Medical History  Diagnosis Date  . Migraine   . Seizures   . Anxiety   . Anxiety     Past Surgical History  Procedure Laterality Date  . Cesarean section    . Cholecystectomy    . Tubal ligation    . Endometrial ablation      No family history on file.  History  Substance Use Topics  . Smoking status: Current Every Day Smoker -- 1.50 packs/day    Types: Cigarettes  . Smokeless tobacco: Not on file  . Alcohol Use: No    OB History   Grav Para Term Preterm Abortions TAB SAB Ect Mult Living                  Review of Systems  All other systems reviewed and are negative.    Allergies  Demerol and Ibuprofen  Home Medications   Current Outpatient Rx  Name  Route  Sig  Dispense  Refill  . albuterol (PROVENTIL HFA;VENTOLIN HFA) 108 (90 BASE) MCG/ACT inhaler   Inhalation   Inhale 2 puffs into the lungs every 6 (six) hours as needed. For shortness of breath         . Aspirin-Acetaminophen (GOODY BODY PAIN) 500-325 MG PACK   Oral   Take 1 packet by mouth daily as needed. As  needed for pain.         . chlorproMAZINE (THORAZINE) 25 MG tablet   Oral   Take 25 mg by mouth 4 (four) times daily. Scheduled doses for anxiety         . chlorproMAZINE (THORAZINE) 50 MG tablet   Oral   Take 150 mg by mouth at bedtime. sleep         . clonazePAM (KLONOPIN) 0.5 MG tablet   Oral   Take 0.5-1 mg by mouth 3 (three) times daily as needed. For anxiety and 2 tablet at bed time         . gabapentin (NEURONTIN) 600 MG tablet   Oral   Take 600 mg by mouth 3 (three) times daily.         Marland Kitchen levETIRAcetam (KEPPRA) 1000 MG tablet   Oral   Take 1,000 mg by mouth 2 (two) times daily.         . SUMAtriptan (IMITREX) 100 MG tablet   Oral   Take 100 mg by mouth every 2 (two) hours as needed. For migraine         . venlafaxine XR (EFFEXOR-XR) 150 MG 24 hr capsule   Oral  Take 150 mg by mouth daily.           BP 106/66  Pulse 116  Temp(Src) 97.7 F (36.5 C) (Oral)  Resp 20  SpO2 96%  LMP 10/02/2012  Physical Exam  Nursing note and vitals reviewed. Constitutional: She is oriented to person, place, and time. She appears well-developed and well-nourished.  Uncomfortable appearing  HENT:  Head: Normocephalic and atraumatic.  No tenderness of sinuses or temples.   Eyes:  Normal appearance  Neck: Normal range of motion. Neck supple. No rigidity. No Brudzinski's sign and no Kernig's sign noted.  Cardiovascular: Regular rhythm and intact distal pulses.   Tachycardia.  Systolic BP 96 which patient reports is typical.   Pulmonary/Chest: Effort normal and breath sounds normal.  Musculoskeletal: Normal range of motion.  Neurological: She is alert and oriented to person, place, and time. No sensory deficit. Coordination normal.  CN 3-12 intact.  No nystagmus.  5/5 and equal upper and lower extremity strength.  No past pointing.    Skin: Skin is warm and dry. No rash noted.  Psychiatric: She has a normal mood and affect. Her behavior is normal.    ED Course   Procedures (including critical care time)  Labs Reviewed - No data to display No results found.   1. Migraine       MDM  36yo F presents w/ headache.  H/o migraines and reports pain today is different, though seen in ED for migraine frequently and had described similarly at those visits.  On exam, non-toxic but uncomfortable appearing, afebrile, tachycardic, no focal neuro deficits or meningeal signs.  Pt received IVF, compazine, decadron and benadryl which gave her mild relief.  Allergic to toradol and droperidol unavailable.  Will try IV Mg2+.  10:28 PM    Pain as well as visual symptoms have improved and patient appears more comfortable.  VS have improved w/ IVF.  She now states that she does infrequently have blurred vision with her migraines.  There is no FH of glaucoma.  Recommended f/u with her neurologist for persistent/recurrent symptoms.  Return precautions discussed. 11:32 PM           Otilio Miu, PA-C 10/25/12 [phone]

## 2012-10-25 NOTE — ED Notes (Signed)
The pt has had a headache for 7 days she takes imitrex oral and that has not helped. n v

## 2012-10-31 NOTE — ED Provider Notes (Signed)
Medical screening examination/treatment/procedure(s) were performed by non-physician practitioner and as supervising physician I was immediately available for consultation/collaboration.  Raeford Razor, MD 10/31/12 [phone]

## 2012-11-13 ENCOUNTER — Encounter: Payer: Self-pay | Admitting: Neurology

## 2013-02-08 ENCOUNTER — Emergency Department (HOSPITAL_COMMUNITY): Payer: Medicare Other

## 2013-02-08 ENCOUNTER — Emergency Department (HOSPITAL_COMMUNITY)
Admission: EM | Admit: 2013-02-08 | Discharge: 2013-02-08 | Disposition: A | Discharge status: Home / Self Care | Payer: Medicare Other | Attending: Emergency Medicine | Admitting: Emergency Medicine

## 2013-02-08 ENCOUNTER — Encounter (HOSPITAL_COMMUNITY): Payer: Self-pay | Admitting: Emergency Medicine

## 2013-02-08 DIAGNOSIS — Z9851 Tubal ligation status: Secondary | ICD-10-CM | POA: Insufficient documentation

## 2013-02-08 DIAGNOSIS — R110 Nausea: Secondary | ICD-10-CM | POA: Insufficient documentation

## 2013-02-08 DIAGNOSIS — R630 Anorexia: Secondary | ICD-10-CM | POA: Insufficient documentation

## 2013-02-08 DIAGNOSIS — R1031 Right lower quadrant pain: Secondary | ICD-10-CM | POA: Insufficient documentation

## 2013-02-08 DIAGNOSIS — F411 Generalized anxiety disorder: Secondary | ICD-10-CM | POA: Insufficient documentation

## 2013-02-08 DIAGNOSIS — Z9889 Other specified postprocedural states: Secondary | ICD-10-CM | POA: Insufficient documentation

## 2013-02-08 DIAGNOSIS — Z79899 Other long term (current) drug therapy: Secondary | ICD-10-CM | POA: Insufficient documentation

## 2013-02-08 DIAGNOSIS — R109 Unspecified abdominal pain: Secondary | ICD-10-CM

## 2013-02-08 DIAGNOSIS — Z3202 Encounter for pregnancy test, result negative: Secondary | ICD-10-CM | POA: Insufficient documentation

## 2013-02-08 DIAGNOSIS — F17200 Nicotine dependence, unspecified, uncomplicated: Secondary | ICD-10-CM | POA: Insufficient documentation

## 2013-02-08 DIAGNOSIS — Z9089 Acquired absence of other organs: Secondary | ICD-10-CM | POA: Insufficient documentation

## 2013-02-08 DIAGNOSIS — G43909 Migraine, unspecified, not intractable, without status migrainosus: Secondary | ICD-10-CM | POA: Insufficient documentation

## 2013-02-08 DIAGNOSIS — G40909 Epilepsy, unspecified, not intractable, without status epilepticus: Secondary | ICD-10-CM | POA: Insufficient documentation

## 2013-02-08 DIAGNOSIS — R197 Diarrhea, unspecified: Secondary | ICD-10-CM | POA: Insufficient documentation

## 2013-02-08 LAB — CBC WITH DIFFERENTIAL/PLATELET
Basophils Absolute: 0 10*3/uL (ref 0.0–0.1)
Eosinophils Absolute: 0.1 10*3/uL (ref 0.0–0.7)
Hemoglobin: 14.9 g/dL (ref 12.0–15.0)
Lymphocytes Relative: 36 % (ref 12–46)
Lymphs Abs: 3.8 10*3/uL (ref 0.7–4.0)
MCH: 31.5 pg (ref 26.0–34.0)
MCV: 93.7 fL (ref 78.0–100.0)
Monocytes Relative: 5 % (ref 3–12)
Neutrophils Relative %: 58 % (ref 43–77)
Platelets: 304 10*3/uL (ref 150–400)
RBC: 4.73 MIL/uL (ref 3.87–5.11)
WBC: 10.5 10*3/uL (ref 4.0–10.5)

## 2013-02-08 LAB — URINALYSIS, ROUTINE W REFLEX MICROSCOPIC
Glucose, UA: NEGATIVE mg/dL
Hgb urine dipstick: NEGATIVE
Specific Gravity, Urine: 1.008 (ref 1.005–1.030)

## 2013-02-08 LAB — COMPREHENSIVE METABOLIC PANEL
AST: 17 U/L (ref 0–37)
Albumin: 3.8 g/dL (ref 3.5–5.2)
Calcium: 9.2 mg/dL (ref 8.4–10.5)
Chloride: 98 mEq/L (ref 96–112)
Creatinine, Ser: 0.72 mg/dL (ref 0.50–1.10)

## 2013-02-08 MED ORDER — IOHEXOL 300 MG/ML  SOLN
100.0000 mL | Freq: Once | INTRAMUSCULAR | Status: AC | PRN
  Administered 2013-02-08: 100 mL via INTRAVENOUS

## 2013-02-08 MED ORDER — ONDANSETRON HCL 4 MG/2ML IJ SOLN
4.0000 mg | Freq: Once | INTRAMUSCULAR | Status: AC
  Administered 2013-02-08: 4 mg via INTRAVENOUS
  Filled 2013-02-08: qty 2

## 2013-02-08 MED ORDER — IOHEXOL 300 MG/ML  SOLN
25.0000 mL | Freq: Once | INTRAMUSCULAR | Status: AC | PRN
  Administered 2013-02-08: 25 mL via ORAL

## 2013-02-08 MED ORDER — HYDROMORPHONE HCL PF 1 MG/ML IJ SOLN
1.0000 mg | Freq: Once | INTRAMUSCULAR | Status: AC
  Administered 2013-02-08: 1 mg via INTRAVENOUS
  Filled 2013-02-08: qty 1

## 2013-02-08 MED ORDER — MORPHINE SULFATE 4 MG/ML IJ SOLN
4.0000 mg | Freq: Once | INTRAMUSCULAR | Status: AC
  Administered 2013-02-08: 4 mg via INTRAVENOUS
  Filled 2013-02-08: qty 1

## 2013-02-08 NOTE — ED Notes (Signed)
Pt finished drinking oral CT contrast. CT notified.  

## 2013-02-08 NOTE — ED Notes (Signed)
Pt c/o RLQ and side pain starting today; pt sts some nausea

## 2013-02-08 NOTE — ED Provider Notes (Signed)
History     CSN: [id number]  Arrival date & time 02/08/13  1651   First MD Initiated Contact with Patient 02/08/13 1705      Chief Complaint  Patient presents with  . Abdominal Pain    (Consider location/radiation/quality/duration/timing/severity/associated sxs/prior treatment) Patient is a 37 y.o. female presenting with abdominal pain. The history is provided by the patient.  Abdominal Pain This is a new problem. The current episode started in the past 7 days. The problem occurs constantly. The problem has been gradually worsening. Associated symptoms include abdominal pain, anorexia, a change in bowel habit (diarrhea) and nausea. Pertinent negatives include no chest pain, chills, fever, urinary symptoms, vomiting or weakness. Exacerbated by: movement. She has tried nothing for the symptoms.    Past Medical History  Diagnosis Date  . Migraine   . Seizures   . Anxiety   . Anxiety     Past Surgical History  Procedure Laterality Date  . Cesarean section    . Cholecystectomy    . Tubal ligation    . Endometrial ablation      History reviewed. No pertinent family history.  History  Substance Use Topics  . Smoking status: Current Every Day Smoker -- 1.50 packs/day    Types: Cigarettes  . Smokeless tobacco: Not on file  . Alcohol Use: No    OB History   Grav Para Term Preterm Abortions TAB SAB Ect Mult Living                  Review of Systems  Constitutional: Negative for fever and chills.  Respiratory: Negative for chest tightness and shortness of breath.   Cardiovascular: Negative for chest pain.  Gastrointestinal: Positive for nausea, abdominal pain, diarrhea, anorexia and change in bowel habit (diarrhea). Negative for vomiting.  Genitourinary: Negative for dysuria and hematuria.  Neurological: Negative for dizziness and weakness.  All other systems reviewed and are negative.    Allergies  Demerol and Ibuprofen  Home Medications   Current Outpatient  Rx  Name  Route  Sig  Dispense  Refill  . albuterol (PROVENTIL HFA;VENTOLIN HFA) 108 (90 BASE) MCG/ACT inhaler   Inhalation   Inhale 2 puffs into the lungs every 6 (six) hours as needed. For shortness of breath         . clonazePAM (KLONOPIN) 0.5 MG tablet   Oral   Take 0.5-1 mg by mouth 3 (three) times daily as needed for anxiety. For anxiety 1 tab in a.m.  and 2 tablet at bedtime         . gabapentin (NEURONTIN) 600 MG tablet   Oral   Take 600 mg by mouth 4 (four) times daily - after meals and at bedtime.          . levETIRAcetam (KEPPRA) 1000 MG tablet   Oral   Take 1,500 mg by mouth 2 (two) times daily.          . mirtazapine (REMERON) 45 MG tablet   Oral   Take 45 mg by mouth at bedtime.         . SUMAtriptan (IMITREX) 100 MG tablet   Oral   Take 100 mg by mouth every 2 (two) hours as needed. For migraine           BP 115/74  Pulse 113  Temp(Src) 98.7 F (37.1 C) (Oral)  Resp 14  SpO2 95%  Physical Exam  Nursing note and vitals reviewed. Constitutional: She is oriented to person, place, and  time. She appears well-developed and well-nourished. No distress.  HENT:  Head: Normocephalic and atraumatic.  Mouth/Throat: Oropharynx is clear and moist.  Eyes: EOM are normal. Pupils are equal, round, and reactive to light.  Neck: Normal range of motion. Neck supple.  Cardiovascular: Normal rate, regular rhythm and normal heart sounds.  Exam reveals no friction rub.   No murmur heard. Pulmonary/Chest: Effort normal and breath sounds normal. No respiratory distress. She has no wheezes. She has no rales.  Abdominal: Soft. There is tenderness (moderate TTP RLQ). There is no rebound and no guarding.  Musculoskeletal: Normal range of motion. She exhibits no edema and no tenderness.  Lymphadenopathy:    She has no cervical adenopathy.  Neurological: She is alert and oriented to person, place, and time.  Skin: Skin is warm and dry. No rash noted.  Psychiatric: She  has a normal mood and affect. Her behavior is normal.    ED Course  Procedures (including critical care time)  Labs Reviewed  COMPREHENSIVE METABOLIC PANEL - Abnormal; Notable for the following:    Sodium 132 (*)    Potassium 3.4 (*)    Glucose, Bld 137 (*)    Total Bilirubin 0.2 (*)    All other components within normal limits  CBC WITH DIFFERENTIAL  URINALYSIS, ROUTINE W REFLEX MICROSCOPIC  POCT PREGNANCY, URINE   Ct Abdomen Pelvis W Contrast  02/08/2013   *RADIOLOGY REPORT*  Clinical Data: Right-sided abdominal pain, nausea.  CT ABDOMEN AND PELVIS WITH CONTRAST  Technique:  Multidetector CT imaging of the abdomen and pelvis was performed following the standard protocol during bolus administration of intravenous contrast.  Contrast: OMNIPAQUE IOHEXOL 300 MG/ML  SOLN  Comparison: 09/21/2012  Findings: Plate-like atelectasis or scarring in the visualized lung bases.  Vascular clips in the gallbladder fossa.  Unremarkable liver, spleen, adrenal glands, pancreas, kidneys, aorta, portal vein.  Stomach, small bowel, and colon are nondilated.  Normal appendix.  Urinary bladder   distended.  Uterus and adnexal regions unremarkable.  No ascites.  No free air.  Left pelvic phlebolith stable.  No adenopathy.  IMPRESSION:  1.  No acute abdominal process.  Normal appendix. 2.  Linear scarring or atelectasis in the lung bases.   Original Report Authenticated By: D. Andria Rhein, MD     1. Abdominal pain       MDM  5:9 PM 37 year old female history of migraine, seizures and anxiety presenting with roughly 4 days of diarrhea and gradually worsening right lower quadrant abdominal pain. She states the pain is worsened today. She localizes at the right lower quadrant, sharp and nonradiating. No urinary symptoms. She endorses nausea, anorexia but no vomiting. She is afebrile here but mildly tachycardic. Has focal tenderness of her right lower quadrant. We'll check labs, urinalysis, CT.  7:34 PM CT  negative. Labs show no significant abnormality. Urinalysis negative. Pain improved but still present. Bimanual reveals very mild bilateral  adnexal tenderness, no fullness or mass. On bimanual she has pain more in her abdomen than in her pelvis. This does not appear to be a pelvic source. Doubt ovarian torsion or TOA. She will call for followup with her primary physician for further evaluation should pain persist. Patient voiced understanding was discharged home in stable condition.      Caren Hazy, MD 02/08/13 2129

## 2013-02-08 NOTE — ED Notes (Signed)
Pt c/o RLQ abd pain that started yesterday. Pt reports some nausea, no vomiting/diarrhea. Pt sts she had a little diarrhea over the weekend but it has resolved. Pt in nad, skin warm and dry, resp e/u. Ambulatory.

## 2013-02-11 NOTE — ED Provider Notes (Signed)
I saw and evaluated the patient, reviewed the resident's note and I agree with the findings and plan.  This patient is seen and evaluated for abdominal pain. Workup has been negative. Patient treated with analgesia.  Gilda Crease, MD 02/11/13 [phone]

## 2013-03-07 ENCOUNTER — Encounter (HOSPITAL_COMMUNITY): Payer: Self-pay | Admitting: *Deleted

## 2013-03-07 ENCOUNTER — Emergency Department (HOSPITAL_COMMUNITY)
Admission: EM | Admit: 2013-03-07 | Discharge: 2013-03-07 | Disposition: A | Discharge status: Home / Self Care | Payer: Medicare Other | Attending: Emergency Medicine | Admitting: Emergency Medicine

## 2013-03-07 ENCOUNTER — Emergency Department (HOSPITAL_COMMUNITY): Payer: Medicare Other

## 2013-03-07 DIAGNOSIS — G43809 Other migraine, not intractable, without status migrainosus: Secondary | ICD-10-CM | POA: Insufficient documentation

## 2013-03-07 DIAGNOSIS — Z79899 Other long term (current) drug therapy: Secondary | ICD-10-CM | POA: Insufficient documentation

## 2013-03-07 DIAGNOSIS — F411 Generalized anxiety disorder: Secondary | ICD-10-CM | POA: Insufficient documentation

## 2013-03-07 DIAGNOSIS — H539 Unspecified visual disturbance: Secondary | ICD-10-CM | POA: Insufficient documentation

## 2013-03-07 DIAGNOSIS — G43919 Migraine, unspecified, intractable, without status migrainosus: Secondary | ICD-10-CM

## 2013-03-07 DIAGNOSIS — G43909 Migraine, unspecified, not intractable, without status migrainosus: Secondary | ICD-10-CM

## 2013-03-07 DIAGNOSIS — F17200 Nicotine dependence, unspecified, uncomplicated: Secondary | ICD-10-CM | POA: Insufficient documentation

## 2013-03-07 DIAGNOSIS — G40909 Epilepsy, unspecified, not intractable, without status epilepticus: Secondary | ICD-10-CM | POA: Insufficient documentation

## 2013-03-07 DIAGNOSIS — R209 Unspecified disturbances of skin sensation: Secondary | ICD-10-CM | POA: Insufficient documentation

## 2013-03-07 MED ORDER — HYDROMORPHONE HCL PF 1 MG/ML IJ SOLN
1.0000 mg | Freq: Once | INTRAMUSCULAR | Status: AC
  Administered 2013-03-07: 1 mg via INTRAVENOUS
  Filled 2013-03-07: qty 1

## 2013-03-07 MED ORDER — DIPHENHYDRAMINE HCL 50 MG/ML IJ SOLN
25.0000 mg | Freq: Once | INTRAMUSCULAR | Status: AC
  Administered 2013-03-07: 25 mg via INTRAVENOUS
  Filled 2013-03-07: qty 1

## 2013-03-07 MED ORDER — DEXAMETHASONE SODIUM PHOSPHATE 10 MG/ML IJ SOLN
10.0000 mg | Freq: Once | INTRAMUSCULAR | Status: AC
  Administered 2013-03-07: 10 mg via INTRAVENOUS
  Filled 2013-03-07: qty 1

## 2013-03-07 MED ORDER — SODIUM CHLORIDE 0.9 % IV BOLUS (SEPSIS)
1000.0000 mL | Freq: Once | INTRAVENOUS | Status: AC
  Administered 2013-03-07: 1000 mL via INTRAVENOUS

## 2013-03-07 MED ORDER — KETOROLAC TROMETHAMINE 60 MG/2ML IM SOLN
60.0000 mg | Freq: Once | INTRAMUSCULAR | Status: AC
  Administered 2013-03-07: 60 mg via INTRAMUSCULAR
  Filled 2013-03-07: qty 4

## 2013-03-07 MED ORDER — METOCLOPRAMIDE HCL 5 MG/ML IJ SOLN
10.0000 mg | Freq: Once | INTRAMUSCULAR | Status: AC
  Administered 2013-03-07: 10 mg via INTRAVENOUS
  Filled 2013-03-07: qty 2

## 2013-03-07 NOTE — ED Provider Notes (Signed)
Medical screening examination/treatment/procedure(s) were performed by non-physician practitioner and as supervising physician I was immediately available for consultation/collaboration.   Ashby Dawes, MD 03/07/13 [phone]

## 2013-03-07 NOTE — ED Provider Notes (Signed)
History    CSN: [id number] Arrival date & time 03/07/13  1702  First MD Initiated Contact with Patient 03/07/13 1931     Chief Complaint  Patient presents with  . Migraine   (Consider location/radiation/quality/duration/timing/severity/associated sxs/prior Treatment) Patient is a 37 y.o. female presenting with migraines. The history is provided by the patient and medical records. No language interpreter was used.  Migraine Associated symptoms include headaches and numbness. Pertinent negatives include no abdominal pain, chest pain, coughing, diaphoresis, fatigue, fever, nausea, rash or vomiting.    Brianna Merritt is a 37 y.o. female  with a hx of migraine, seizures, anxiety presents to the Emergency Department complaining of gradual, persistent, progressively worsening headache beginning at 0300 today with associated paresthesias of the arms bilaterally, blurry vision which has persisted even after 2 doses of sumatriptan.  Pt also endorses nausea and diarrhea x1.  Nothing makes it better and light and sound makes it worse.  Pt describes HA as sharp stabbing located in the L parietal and radiating to the L neck.  Pt usual presentation with periorbital and relieved with sumatriptan.  Pt denies fever, chills, chest pain, SOB, abd pain, vomiting.     Past Medical History  Diagnosis Date  . Migraine   . Seizures   . Anxiety   . Anxiety    Past Surgical History  Procedure Laterality Date  . Cesarean section    . Cholecystectomy    . Tubal ligation    . Endometrial ablation     History reviewed. No pertinent family history. History  Substance Use Topics  . Smoking status: Current Every Day Smoker -- 1.50 packs/day    Types: Cigarettes  . Smokeless tobacco: Not on file  . Alcohol Use: No   OB History   Grav Para Term Preterm Abortions TAB SAB Ect Mult Living                 Review of Systems  Constitutional: Negative for fever, diaphoresis, appetite change, fatigue and  unexpected weight change.  HENT: Negative for mouth sores and neck stiffness.   Eyes: Positive for visual disturbance.  Respiratory: Negative for cough, chest tightness, shortness of breath and wheezing.   Cardiovascular: Negative for chest pain.  Gastrointestinal: Negative for nausea, vomiting, abdominal pain, diarrhea and constipation.  Endocrine: Negative for polydipsia, polyphagia and polyuria.  Genitourinary: Negative for dysuria, urgency, frequency and hematuria.  Musculoskeletal: Negative for back pain.  Skin: Negative for rash.  Allergic/Immunologic: Negative for immunocompromised state.  Neurological: Positive for numbness and headaches. Negative for syncope and light-headedness.  Hematological: Does not bruise/bleed easily.  Psychiatric/Behavioral: Negative for sleep disturbance. The patient is not nervous/anxious.     Allergies  Demerol and Ibuprofen  Home Medications   Current Outpatient Rx  Name  Route  Sig  Dispense  Refill  . albuterol (PROVENTIL HFA;VENTOLIN HFA) 108 (90 BASE) MCG/ACT inhaler   Inhalation   Inhale 2 puffs into the lungs every 6 (six) hours as needed. For shortness of breath         . clonazePAM (KLONOPIN) 0.5 MG tablet   Oral   Take 0.5-1 mg by mouth 4 (four) times daily as needed for anxiety. For anxiety 1 tab three times daily and 2 tabs at bedtime         . gabapentin (NEURONTIN) 600 MG tablet   Oral   Take 600 mg by mouth 4 (four) times daily - after meals and at bedtime.          Marland Kitchen  levETIRAcetam (KEPPRA) 1000 MG tablet   Oral   Take 1,500 mg by mouth 2 (two) times daily.          . mirtazapine (REMERON) 45 MG tablet   Oral   Take 45 mg by mouth at bedtime.         . SUMAtriptan (IMITREX) 100 MG tablet   Oral   Take 100 mg by mouth every 2 (two) hours as needed. For migraine          BP 103/72  Pulse 77  Temp(Src) 97.6 F (36.4 C) (Oral)  Resp 16  Ht 5\' 1"  (1.549 m)  Wt 182 lb (82.555 kg)  BMI 34.41 kg/m2  SpO2  93%  LMP 02/21/2013 Physical Exam  Nursing note and vitals reviewed. Constitutional: She is oriented to person, place, and time. She appears well-developed and well-nourished. No distress.  HENT:  Head: Normocephalic and atraumatic.  Right Ear: Hearing, tympanic membrane, external ear and ear canal normal.  Left Ear: Hearing, tympanic membrane, external ear and ear canal normal.  Nose: Nose normal. No mucosal edema.  Mouth/Throat: Uvula is midline, oropharynx is clear and moist and mucous membranes are normal. Mucous membranes are not dry and not cyanotic. No edematous. No oropharyngeal exudate, posterior oropharyngeal edema, posterior oropharyngeal erythema or tonsillar abscesses.  Eyes: Conjunctivae and EOM are normal. Pupils are equal, round, and reactive to light. No scleral icterus.  Neck: Normal range of motion. Neck supple.  Cardiovascular: Normal rate, regular rhythm, normal heart sounds and intact distal pulses.   No murmur heard. Pulmonary/Chest: Effort normal and breath sounds normal. No respiratory distress. She has no wheezes. She has no rales.  Abdominal: Soft. Bowel sounds are normal. There is no tenderness. There is no rebound and no guarding.  Musculoskeletal: Normal range of motion. She exhibits no tenderness.  Lymphadenopathy:    She has no cervical adenopathy.  Neurological: She is alert and oriented to person, place, and time. She has normal reflexes. No cranial nerve deficit. She exhibits normal muscle tone. Coordination normal.  Speech is clear and goal oriented, follows commands Cranial nerves III - XII without deficit, no facial droop Normal strength in upper and lower extremities bilaterally, strong and equal grip strength Sensation normal to light and sharp touch Moves extremities without ataxia, coordination intact Normal finger to nose and rapid alternating movements Neg romberg, no pronator drift Normal gait Normal heel-shin and balance   Skin: Skin is  warm and dry. No rash noted. She is not diaphoretic.  Psychiatric: She has a normal mood and affect. Her behavior is normal. Judgment and thought content normal.    ED Course  Procedures (including critical care time) Labs Reviewed - No data to display Ct Head Wo Contrast  03/07/2013   *RADIOLOGY REPORT*  Clinical Data: Migraine headache 1.  CT HEAD WITHOUT CONTRAST  Technique:  Contiguous axial images were obtained from the base of the skull through the vertex without contrast.  Comparison: 11/03/2012  Findings: The brain has a normal appearance without evidence for hemorrhage, infarction, hydrocephalus, or mass lesion.  There is no extra axial fluid collection.  The skull and paranasal sinuses are normal.  IMPRESSION: Normal appearance of the brain.   Original Report Authenticated By: Signa Kell, M.D.   1. Migraine headache   2. Refractory migraine     MDM  Brianna Merritt presents with migraine headache.  Patient with history of migraine headaches but today's headache is different from normal presentation with complaints of numbness  though neurologic exam is normal. Pt HA treated and improved while in ED.  Presentation is non concerning for [facility], ICH, Meningitis, or temporal arteritis; however secondary to variation from patient's normal headache will obtain CT scan.    11:11 PM CT scan with normal appearance of the brain and no evidence for hemorrhage, infarction or hydrocephalus.  I personally reviewed the imaging tests through PACS system.  I reviewed available ER/hospitalization records through the EMR.  Pt is afebrile with no focal neuro deficits on repeat exam, nuchal rigidity.  Vision changes have resolved as well as complaints of numbness after resolution of headache. Likely complex migraine. Pt is to follow up with PCP to discuss prophylactic medication; he is also given resources for neurology. Pt verbalizes understanding and is agreeable with plan to dc. I have also discussed  reasons to return immediately to the ER.  Patient expresses understanding and agrees with plan.     Dahlia Client Jalaiyah Throgmorton, PA-C 03/07/13 2312

## 2013-03-07 NOTE — ED Notes (Signed)
Reports waking up this am with severe migraine 0300. Hx of migraines. Having blurred vision and nausea, no vomiting.

## 2013-03-26 ENCOUNTER — Encounter (HOSPITAL_COMMUNITY): Payer: Self-pay | Admitting: *Deleted

## 2013-03-26 ENCOUNTER — Emergency Department (HOSPITAL_COMMUNITY): Payer: Medicare Other

## 2013-03-26 ENCOUNTER — Emergency Department (HOSPITAL_COMMUNITY)
Admission: EM | Admit: 2013-03-26 | Discharge: 2013-03-26 | Disposition: A | Discharge status: Home / Self Care | Payer: Medicare Other | Attending: Emergency Medicine | Admitting: Emergency Medicine

## 2013-03-26 ENCOUNTER — Encounter (HOSPITAL_COMMUNITY): Payer: Self-pay

## 2013-03-26 ENCOUNTER — Emergency Department (HOSPITAL_COMMUNITY)
Admission: EM | Admit: 2013-03-26 | Discharge: 2013-03-26 | Disposition: A | Discharge status: Home / Self Care | Payer: Medicare Other | Source: Home / Self Care | Attending: Emergency Medicine | Admitting: Emergency Medicine

## 2013-03-26 DIAGNOSIS — R059 Cough, unspecified: Secondary | ICD-10-CM | POA: Insufficient documentation

## 2013-03-26 DIAGNOSIS — K219 Gastro-esophageal reflux disease without esophagitis: Secondary | ICD-10-CM | POA: Insufficient documentation

## 2013-03-26 DIAGNOSIS — Z885 Allergy status to narcotic agent status: Secondary | ICD-10-CM | POA: Insufficient documentation

## 2013-03-26 DIAGNOSIS — R509 Fever, unspecified: Secondary | ICD-10-CM | POA: Insufficient documentation

## 2013-03-26 DIAGNOSIS — Z79899 Other long term (current) drug therapy: Secondary | ICD-10-CM | POA: Insufficient documentation

## 2013-03-26 DIAGNOSIS — F17200 Nicotine dependence, unspecified, uncomplicated: Secondary | ICD-10-CM | POA: Insufficient documentation

## 2013-03-26 DIAGNOSIS — J029 Acute pharyngitis, unspecified: Secondary | ICD-10-CM

## 2013-03-26 DIAGNOSIS — G40909 Epilepsy, unspecified, not intractable, without status epilepticus: Secondary | ICD-10-CM | POA: Insufficient documentation

## 2013-03-26 DIAGNOSIS — R05 Cough: Secondary | ICD-10-CM | POA: Insufficient documentation

## 2013-03-26 DIAGNOSIS — F411 Generalized anxiety disorder: Secondary | ICD-10-CM | POA: Insufficient documentation

## 2013-03-26 DIAGNOSIS — G43909 Migraine, unspecified, not intractable, without status migrainosus: Secondary | ICD-10-CM | POA: Insufficient documentation

## 2013-03-26 DIAGNOSIS — R599 Enlarged lymph nodes, unspecified: Secondary | ICD-10-CM | POA: Insufficient documentation

## 2013-03-26 DIAGNOSIS — R1310 Dysphagia, unspecified: Secondary | ICD-10-CM | POA: Insufficient documentation

## 2013-03-26 DIAGNOSIS — Z8669 Personal history of other diseases of the nervous system and sense organs: Secondary | ICD-10-CM | POA: Insufficient documentation

## 2013-03-26 DIAGNOSIS — R569 Unspecified convulsions: Secondary | ICD-10-CM | POA: Insufficient documentation

## 2013-03-26 LAB — RAPID STREP SCREEN (MED CTR MEBANE ONLY): Streptococcus, Group A Screen (Direct): NEGATIVE

## 2013-03-26 MED ORDER — CEFTRIAXONE SODIUM 1 G IJ SOLR
1.0000 g | Freq: Once | INTRAMUSCULAR | Status: AC
  Administered 2013-03-26: 1 g via INTRAMUSCULAR
  Filled 2013-03-26: qty 10

## 2013-03-26 MED ORDER — ACETAMINOPHEN 160 MG/5ML PO SOLN
500.0000 mg | Freq: Once | ORAL | Status: AC
  Administered 2013-03-26: 500 mg via ORAL
  Filled 2013-03-26: qty 20.3

## 2013-03-26 MED ORDER — HYDROCODONE-ACETAMINOPHEN 7.5-325 MG/15ML PO SOLN
10.0000 mL | Freq: Once | ORAL | Status: AC
  Administered 2013-03-26: 10 mL via ORAL
  Filled 2013-03-26: qty 15

## 2013-03-26 MED ORDER — AMOXICILLIN-POT CLAVULANATE 875-125 MG PO TABS: 1.0000 | ORAL_TABLET | Freq: Two times a day (BID) | ORAL | Status: DC

## 2013-03-26 MED ORDER — HYDROCODONE-ACETAMINOPHEN 7.5-325 MG/15ML PO SOLN: 10.0000 mL | Freq: Four times a day (QID) | ORAL | Status: DC | PRN

## 2013-03-26 MED ORDER — DEXAMETHASONE SODIUM PHOSPHATE 4 MG/ML IJ SOLN
4.0000 mg | Freq: Once | INTRAMUSCULAR | Status: AC
  Administered 2013-03-26: 4 mg via INTRAMUSCULAR
  Filled 2013-03-26: qty 1

## 2013-03-26 MED ORDER — OMEPRAZOLE 20 MG PO CPDR: 20.0000 mg | DELAYED_RELEASE_CAPSULE | Freq: Every day | ORAL | Status: DC

## 2013-03-26 MED ORDER — LIDOCAINE HCL (PF) 1 % IJ SOLN
INTRAMUSCULAR | Status: AC
  Administered 2013-03-26: 5 mL
  Filled 2013-03-26: qty 5

## 2013-03-26 NOTE — ED Provider Notes (Signed)
Medical screening examination/treatment/procedure(s) were performed by non-physician practitioner and as supervising physician I was immediately available for consultation/collaboration.  Jones Skene, M.D.     Jones Skene, MD 03/26/13 2258

## 2013-03-26 NOTE — ED Provider Notes (Signed)
CSN: [id number]     Arrival date & time 03/26/13  1757 History  This chart was scribed for Jaynie Crumble, PA-C, working with Lyanne Co, MD, by [facility] ED Scribe. This patient was seen in room TR08C/TR08C and the patient's care was started at 8:31 PM.    First MD Initiated Contact with Patient 03/26/13 2030     Chief Complaint  Patient presents with  . Sore Throat    The history is provided by the patient. No language interpreter was used.   HPI Comments: Brianna Merritt is a 37 y.o. female who presents to the Emergency Department complaining of a gradually worsening sore throat over the past 3-4 weeks. States initially thought it was allergies, however, in the last 2 days, pain worsened. She states that she had an associated fever earlier today which has subsided. ED Temperature is 98.3 F. She also reports an associated cough. She states that her sore throat pain is worsened with swallowing, and she also reports trouble with swallowing. She states that she had some difficulty breathing last night, with the feeling that her airway was constricted, which woke her from her sleep. She states that she had a "weird" taste in her mouth at this time. She states that she has been seen in the ED this morning for the same symptoms and was told to return if symptoms worsened. She states that she was prescribed antibiotics, but has not taken due to her difficulty swallowing.  She denies chills, nausea, vomiting, rash or any other symptoms. She is a current every day smoker of 1.5 packs.day.   Past Medical History  Diagnosis Date  . Migraine   . Seizures   . Anxiety   . Anxiety    Past Surgical History  Procedure Laterality Date  . Cesarean section    . Cholecystectomy    . Tubal ligation    . Endometrial ablation     No family history on file. History  Substance Use Topics  . Smoking status: Current Every Day Smoker -- 1.50 packs/day    Types: Cigarettes  . Smokeless tobacco:  Not on file  . Alcohol Use: No   OB History   Grav Para Term Preterm Abortions TAB SAB Ect Mult Living                 Review of Systems  Constitutional: Positive for fever (subsided). Negative for chills.  HENT: Positive for sore throat and trouble swallowing.   Respiratory: Positive for cough.   Gastrointestinal: Negative for nausea and vomiting.  Skin: Negative for rash.  All other systems reviewed and are negative.    Allergies  Demerol and Ibuprofen  Home Medications   Current Outpatient Rx  Name  Route  Sig  Dispense  Refill  . acetaminophen (TYLENOL) 500 MG tablet   Oral   Take 1,500 mg by mouth every 6 (six) hours as needed for pain.         Marland Kitchen albuterol (PROVENTIL HFA;VENTOLIN HFA) 108 (90 BASE) MCG/ACT inhaler   Inhalation   Inhale 2 puffs into the lungs every 6 (six) hours as needed for wheezing or shortness of breath.          Marland Kitchen amoxicillin-clavulanate (AUGMENTIN) 875-125 MG per tablet   Oral   Take 1 tablet by mouth 2 (two) times daily. For 7 days; Start date 03/26/13         . cetirizine (ZYRTEC) 10 MG tablet   Oral   Take 10  mg by mouth daily as needed for allergies.         . clonazePAM (KLONOPIN) 0.5 MG tablet   Oral   Take 0.5-1 mg by mouth 4 (four) times daily. For anxiety 1 tab three times daily and 2 tabs at bedtime         . diphenhydrAMINE (BENADRYL) 25 MG tablet   Oral   Take 25 mg by mouth every 6 (six) hours as needed for itching or allergies.         Marland Kitchen gabapentin (NEURONTIN) 600 MG tablet   Oral   Take 600 mg by mouth 4 (four) times daily - after meals and at bedtime.          Marland Kitchen guaiFENesin-dextromethorphan (ROBITUSSIN DM) 100-10 MG/5ML syrup   Oral   Take 10 mLs by mouth 3 (three) times daily as needed for cough.         . levETIRAcetam (KEPPRA) 1000 MG tablet   Oral   Take 1,500 mg by mouth 2 (two) times daily.          Marland Kitchen loratadine (CLARITIN) 10 MG tablet   Oral   Take 10 mg by mouth daily as needed for  allergies.         . mirtazapine (REMERON) 45 MG tablet   Oral   Take 45 mg by mouth at bedtime.         Marland Kitchen omeprazole (PRILOSEC) 20 MG capsule   Oral   Take 1 capsule (20 mg total) by mouth daily.   10 capsule   0   . promethazine (PHENERGAN) 25 MG tablet   Oral   Take 25 mg by mouth every 6 (six) hours as needed for nausea.         . SUMAtriptan (IMITREX) 100 MG tablet   Oral   Take 100 mg by mouth every 2 (two) hours as needed for migraine.           Triage Vitals: BP 112/73  Pulse 90  Temp(Src) 98.3 F (36.8 C) (Oral)  Resp 18  SpO2 97%  LMP 03/22/2013  Physical Exam  Nursing note and vitals reviewed. Constitutional: She is oriented to person, place, and time. She appears well-developed and well-nourished.  HENT:  Head: Normocephalic and atraumatic.  Mouth/Throat: No oropharyngeal exudate.  Oropharynx is mildly erythematous. Tonsils are non-edematous. Uvula midline. TMs normal bilaterally  Eyes: EOM are normal. Pupils are equal, round, and reactive to light.  Neck: Normal range of motion. Neck supple.  Cardiovascular: Normal rate, regular rhythm and normal heart sounds.   Pulmonary/Chest: Effort normal and breath sounds normal. No respiratory distress.  Abdominal: Soft. Bowel sounds are normal. There is no tenderness.  Musculoskeletal: Normal range of motion. She exhibits no tenderness.  Lymphadenopathy:    She has no cervical adenopathy.  Neurological: She is alert and oriented to person, place, and time.  Skin: Skin is warm and dry. No rash noted.  Psychiatric: She has a normal mood and affect.    ED Course   Medications  cefTRIAXone (ROCEPHIN) injection 1 g (not administered)  HYDROcodone-acetaminophen (HYCET) 7.5-325 mg/15 ml solution 10 mL (not administered)  lidocaine (PF) (XYLOCAINE) 1 % injection (not administered)  dexamethasone (DECADRON) injection 4 mg (4 mg Intramuscular Given 03/26/13 2109)   Procedures (including critical care  time)  DIAGNOSTIC STUDIES: Oxygen Saturation is 97% on RA, normal by my interpretation.    COORDINATION OF CARE: 8:54 PM- Pt advised of plan for diagnostic radiology, along with  plan to receive Decadron and pt agrees. 9:48 PM- Recheck with pt and pt advised of normal radiology findings. Pt advised of plan for additional medications and pt agrees with this plan, as well as plan for discharge.   Labs Reviewed - No data to display  Dg Neck Soft Tissue  03/26/2013   *RADIOLOGY REPORT*  Clinical Data: Sore throat, fever  NECK SOFT TISSUES - 1+ VIEW  Comparison: None.  Findings: Metallic earrings overlying C1-2 articulation.  No significant retropharyngeal or prevertebral soft tissue thickening. Cervical spine aligned.  All teeth have been extracted.  Normal epiglottic shadow.  IMPRESSION: No acute finding by plain radiography   Original Report Authenticated By: Judie Petit. Shick, M.D.    1. Pharyngitis     MDM  Pt with sore throat, pain with swallowing. On exam, no significant edema, uvula midline, no evidence of retropharyngeal or peritonsillar abscess. Soft tissue neck obtained and is normal. Pt treated with decadron in ED for swelling. lortab elixir here for pain and at home for pain relief so she can take her antibiotic. Follow up with PCP. VS normal. Strep screen negative this morning.   Filed Vitals:   03/26/13 1851 03/26/13 2223  BP: 112/73 109/74  Pulse: 90 71  Temp: 98.3 F (36.8 C)   TempSrc: Oral   Resp: 18 18  SpO2: 97% 98%       I personally performed the services described in this documentation, which was scribed in my presence. The recorded information has been reviewed and is accurate.    Lottie Mussel, PA-C 03/26/13 2329

## 2013-03-26 NOTE — ED Notes (Signed)
Pt comfortable with d/c and f/u instructions. Prescriptions x1 

## 2013-03-26 NOTE — ED Notes (Signed)
PT is here with worsening sore throat over the last month.  Pt has red irritated throat and reports cough.  Hard to swallow

## 2013-03-26 NOTE — ED Provider Notes (Signed)
CSN: [id number]     Arrival date & time 03/26/13  0622 History     First MD Initiated Contact with Patient 03/26/13 [phone]     Chief Complaint  Patient presents with  . Sore Throat   (Consider location/radiation/quality/duration/timing/severity/associated sxs/prior Treatment) The history is provided by the patient. No language interpreter was used.  Brianna Merritt is a 37 y/o F with PMHx migraines, seizures, anxiety presenting to the ED with sore throat that has been ongoing for the past month that has gotten progressively worse over the weekend. Patient reported that the throat has a burning, raw sensation with a feeling of tightness. Patient reported that the pain is worse when she swallows, stated that when she swallows she has a tightness and coarse feeling, reported that liquids are easier than foods. Patient reported that sipping on cool drinks makes the pain better. Patient reported that she has been taking Benadryl, Tylenol, Zyrtec, lozenges, Robotussin DM without relief. Patient reported that she has been feeling feverish the other day. reported that she has noticed that her voice has gotten deeper. Reported dry cough. Denied nasal congestion, rhinorrhea, dental pain/problem, chest pain, shortness of breath, difficulty breathing, nausea, vomiting, diarrhea, abdominal pain, facial pain, eye complaints, ear complaints, neck pain, back pain, sick contacts.  PCP none    Past Medical History  Diagnosis Date  . Migraine   . Seizures   . Anxiety   . Anxiety    Past Surgical History  Procedure Laterality Date  . Cesarean section    . Cholecystectomy    . Tubal ligation    . Endometrial ablation     No family history on file. History  Substance Use Topics  . Smoking status: Current Every Day Smoker -- 1.50 packs/day    Types: Cigarettes  . Smokeless tobacco: Not on file  . Alcohol Use: No   OB History   Grav Para Term Preterm Abortions TAB SAB Ect Mult Living                  Review of Systems  Constitutional: Negative for chills.  HENT: Positive for sore throat and trouble swallowing. Negative for ear pain, congestion, rhinorrhea, neck pain, neck stiffness and dental problem.   Eyes: Negative for pain and visual disturbance.  Respiratory: Positive for cough. Negative for chest tightness and shortness of breath.   Gastrointestinal: Negative for nausea, vomiting, abdominal pain and diarrhea.  Musculoskeletal: Negative for back pain.  Neurological: Negative for headaches.  All other systems reviewed and are negative.    Allergies  Demerol and Ibuprofen  Home Medications   Current Outpatient Rx  Name  Route  Sig  Dispense  Refill  . acetaminophen (TYLENOL) 500 MG tablet   Oral   Take 1,500 mg by mouth every 6 (six) hours as needed for pain.         . cetirizine (ZYRTEC) 10 MG tablet   Oral   Take 10 mg by mouth daily as needed for allergies.         . clonazePAM (KLONOPIN) 0.5 MG tablet   Oral   Take 0.5-1 mg by mouth See admin instructions. For anxiety 1 tab three times daily and 2 tabs at bedtime         . diphenhydrAMINE (BENADRYL) 25 MG tablet   Oral   Take 25 mg by mouth every 6 (six) hours as needed for itching or allergies.         Marland Kitchen gabapentin (NEURONTIN) 600 MG  tablet   Oral   Take 600 mg by mouth 4 (four) times daily - after meals and at bedtime.          Marland Kitchen guaiFENesin-dextromethorphan (ROBITUSSIN DM) 100-10 MG/5ML syrup   Oral   Take 10 mLs by mouth 3 (three) times daily as needed for cough.         . levETIRAcetam (KEPPRA) 1000 MG tablet   Oral   Take 1,500 mg by mouth 2 (two) times daily.          Marland Kitchen loratadine (CLARITIN) 10 MG tablet   Oral   Take 10 mg by mouth daily as needed for allergies.         . mirtazapine (REMERON) 45 MG tablet   Oral   Take 45 mg by mouth at bedtime.         . promethazine (PHENERGAN) 25 MG tablet   Oral   Take 25 mg by mouth every 6 (six) hours as needed for nausea.          . SUMAtriptan (IMITREX) 100 MG tablet   Oral   Take 100 mg by mouth every 2 (two) hours as needed for migraine. For migraine         . albuterol (PROVENTIL HFA;VENTOLIN HFA) 108 (90 BASE) MCG/ACT inhaler   Inhalation   Inhale 2 puffs into the lungs every 6 (six) hours as needed. For shortness of breath          BP 121/69  Pulse 97  Temp(Src) 98.2 F (36.8 C) (Oral)  Resp 18  SpO2 100%  LMP 03/22/2013 Physical Exam  Nursing note and vitals reviewed. Constitutional: She is oriented to person, place, and time. She appears well-developed and well-nourished. No distress.  HENT:  Head: Normocephalic and atraumatic.  Mouth/Throat: No oropharyngeal exudate.  Negative pain upon palpation to the face  Uvula midline, symmetrical elevation Mild erythema noted to the posterior oropharynx - negative swelling, exudate, petechiae noted Negative trismus Negative drooling noted    Eyes: Conjunctivae and EOM are normal. Pupils are equal, round, and reactive to light.  Neck: Normal range of motion. Neck supple. No tracheal deviation present.  Negative neck stiffness Negative nuchal rigidity Negative pain upon palpation to the mid-cervical spine Negative pain upon palpation to the neck Negative masses palpated  Mild lymphadenopathy noted, right tonsil more so than left - mild swelling and tenderness noted, mobile and soft bilaterally  Cardiovascular: Normal rate, regular rhythm and normal heart sounds.  Exam reveals no friction rub.   No murmur heard. Pulmonary/Chest: Effort normal and breath sounds normal. No respiratory distress. She has no wheezes. She has no rales.  Neurological: She is alert and oriented to person, place, and time. No cranial nerve deficit. She exhibits normal muscle tone. Coordination normal.  Skin: Skin is warm and dry. No rash noted. She is not diaphoretic. No erythema.  Psychiatric: She has a normal mood and affect. Her behavior is normal. Thought content  normal.    ED Course   Procedures (including critical care time)  Labs Reviewed  RAPID STREP SCREEN   No results found. 1. Sore throat   2. GERD (gastroesophageal reflux disease)     MDM  Patient presenting to the ED with sore throat x 1 month that has gotten progressively worse over the weekend - stated that the pain is more of a raw, burning sensation worse with swallowing. Mild erythema noted to posterior oropharynx - negative swelling, exudate. Uvula midline and symmetrical, negative  sign of abscess. Negative trismus. Full ROM to the neck - negative masses palpated, negative asymmetry.  Doubt peritonsillar abscess - uvula midline, negative masses, negative drooling, negative "hot-potato" voice. Suspicion to be viral, possible GERD - definitive etiology unknown. Patient stable, afebrile. Discharged patient with antibiotics due to prolonging discomfort and for coverage. Patient tolerated PO challenge. Discharged patient with PPI. Discussed with patient to continue to drinks plenty of fluids. [facility]. Discussed with patient to continue to use Tylenol prn. Discussed with patient to continue to monitor symptoms and if symptoms are to worsen or change to report back to the ED - strict return instructions given.  Patient agreed to plan of care, understood, all questions answered.     Raymon Mutton, PA-C 03/26/13 1726

## 2013-03-26 NOTE — ED Notes (Signed)
Patient presents with sore throat x several weeks, was seen here in the ED this AM and was told to come back if symptoms worsened.  Patient prescribed antibiotics, patient unable to start due to painful swallowing.

## 2013-03-27 NOTE — ED Provider Notes (Signed)
Medical screening examination/treatment/procedure(s) were performed by non-physician practitioner and as supervising physician I was immediately available for consultation/collaboration.  Lyanne Co, MD 03/27/13 [phone]

## 2013-03-28 LAB — CULTURE, GROUP A STREP

## 2013-07-28 ENCOUNTER — Encounter (HOSPITAL_COMMUNITY): Payer: Self-pay | Admitting: Emergency Medicine

## 2013-07-28 DIAGNOSIS — Z79899 Other long term (current) drug therapy: Secondary | ICD-10-CM | POA: Insufficient documentation

## 2013-07-28 DIAGNOSIS — F411 Generalized anxiety disorder: Secondary | ICD-10-CM | POA: Insufficient documentation

## 2013-07-28 DIAGNOSIS — G43909 Migraine, unspecified, not intractable, without status migrainosus: Secondary | ICD-10-CM | POA: Insufficient documentation

## 2013-07-28 DIAGNOSIS — G40909 Epilepsy, unspecified, not intractable, without status epilepticus: Secondary | ICD-10-CM | POA: Insufficient documentation

## 2013-07-28 DIAGNOSIS — F17200 Nicotine dependence, unspecified, uncomplicated: Secondary | ICD-10-CM | POA: Insufficient documentation

## 2013-07-28 NOTE — ED Notes (Signed)
Patient with period of staring off this afternoon, patient does have seizure history.  Patient does have headache with it.  She does have some photophobia.  No relief with OTC pain meds, she does have nausea.

## 2013-07-29 ENCOUNTER — Emergency Department (HOSPITAL_COMMUNITY)
Admission: EM | Admit: 2013-07-29 | Discharge: 2013-07-29 | Disposition: A | Discharge status: Home / Self Care | Payer: Medicare Other | Attending: Emergency Medicine | Admitting: Emergency Medicine

## 2013-07-29 MED ORDER — SODIUM CHLORIDE 0.9 % IV BOLUS (SEPSIS)
1000.0000 mL | Freq: Once | INTRAVENOUS | Status: AC
  Administered 2013-07-29: 1000 mL via INTRAVENOUS

## 2013-07-29 MED ORDER — DIPHENHYDRAMINE HCL 50 MG/ML IJ SOLN
25.0000 mg | Freq: Once | INTRAMUSCULAR | Status: AC
  Administered 2013-07-29: 25 mg via INTRAVENOUS
  Filled 2013-07-29: qty 1

## 2013-07-29 MED ORDER — METOCLOPRAMIDE HCL 5 MG/ML IJ SOLN
10.0000 mg | Freq: Once | INTRAMUSCULAR | Status: AC
  Administered 2013-07-29: 10 mg via INTRAVENOUS
  Filled 2013-07-29: qty 2

## 2013-07-29 MED ORDER — PROMETHAZINE HCL 25 MG PO TABS: 25.0000 mg | ORAL_TABLET | Freq: Four times a day (QID) | ORAL | Status: DC | PRN

## 2013-07-29 MED ORDER — DEXAMETHASONE SODIUM PHOSPHATE 4 MG/ML IJ SOLN
10.0000 mg | Freq: Once | INTRAMUSCULAR | Status: AC
  Administered 2013-07-29: 10 mg via INTRAVENOUS
  Filled 2013-07-29: qty 3

## 2013-07-29 MED ORDER — MAGNESIUM SULFATE 40 MG/ML IJ SOLN
2.0000 g | Freq: Once | INTRAMUSCULAR | Status: AC
  Administered 2013-07-29: 2 g via INTRAVENOUS
  Filled 2013-07-29: qty 50

## 2013-07-29 MED ORDER — PROMETHAZINE HCL 25 MG/ML IJ SOLN
25.0000 mg | Freq: Once | INTRAMUSCULAR | Status: AC
  Administered 2013-07-29: 25 mg via INTRAVENOUS
  Filled 2013-07-29: qty 1

## 2013-07-29 NOTE — ED Notes (Signed)
Pt states her headache is now worse. Also still feels nauseous.

## 2013-07-29 NOTE — ED Provider Notes (Signed)
CSN: [id number]     Arrival date & time 07/28/13  2255 History   First MD Initiated Contact with Patient 07/29/13 0021     Chief Complaint  Patient presents with  . Headache   (Consider location/radiation/quality/duration/timing/severity/associated sxs/prior Treatment) HPI HX per PT - has h/o complex migraines. Tonight had a possible seizure, is amnestic to this event, husband states was sitting in the car unresponsive for moments and afterwards developed HA.  She is taking keppra and klonopin as prescribed. She presents with L sided sharp throbbing HA, no radiation, mod to severe. No neck pain or stiffness, no F/C, is on new migraine medication form her neurologist DR Ala Dach at [place] - she took this without relief. Has some nausea no emesis.   Past Medical History  Diagnosis Date  . Migraine   . Seizures   . Anxiety   . Anxiety    Past Surgical History  Procedure Laterality Date  . Cesarean section    . Cholecystectomy    . Tubal ligation    . Endometrial ablation     No family history on file. History  Substance Use Topics  . Smoking status: Current Every Day Smoker -- 1.50 packs/day    Types: Cigarettes  . Smokeless tobacco: Not on file  . Alcohol Use: No   OB History   Grav Para Term Preterm Abortions TAB SAB Ect Mult Living                 Review of Systems  Constitutional: Negative for fever and chills.  Eyes: Positive for photophobia. Negative for visual disturbance.  Respiratory: Negative for shortness of breath.   Cardiovascular: Negative for chest pain.  Gastrointestinal: Negative for abdominal pain.  Genitourinary: Negative for dysuria.  Musculoskeletal: Negative for back pain, neck pain and neck stiffness.  Skin: Negative for rash.  Neurological: Positive for headaches. Negative for syncope.  All other systems reviewed and are negative.    Allergies  Demerol; Sumatriptan; and Ibuprofen  Home Medications   Current Outpatient Rx  Name  Route   Sig  Dispense  Refill  . albuterol (PROVENTIL HFA;VENTOLIN HFA) 108 (90 BASE) MCG/ACT inhaler   Inhalation   Inhale 2 puffs into the lungs every 6 (six) hours as needed for wheezing or shortness of breath.          . clonazePAM (KLONOPIN) 0.5 MG tablet   Oral   Take 0.5-1 mg by mouth 4 (four) times daily. For anxiety 1 tab three times daily and 2 tabs at bedtime         . gabapentin (NEURONTIN) 600 MG tablet   Oral   Take 600 mg by mouth 4 (four) times daily - after meals and at bedtime.          . levETIRAcetam (KEPPRA) 1000 MG tablet   Oral   Take 1,500 mg by mouth 2 (two) times daily.          . mirtazapine (REMERON) 45 MG tablet   Oral   Take 45 mg by mouth at bedtime.         . promethazine (PHENERGAN) 25 MG tablet   Oral   Take 25 mg by mouth every 6 (six) hours as needed for nausea.          BP 112/69  Pulse 109  Temp(Src) 97.3 F (36.3 C) (Oral)  Resp 18  Ht 5' (1.524 m)  Wt 177 lb 4.8 oz (80.423 kg)  BMI 34.63 kg/m2  SpO2  97%  LMP 07/02/2013 Physical Exam  Constitutional: She is oriented to person, place, and time. She appears well-developed and well-nourished.  HENT:  Head: Normocephalic and atraumatic.  Eyes: Conjunctivae and EOM are normal. Pupils are equal, round, and reactive to light.  Neck: Full passive range of motion without pain. Neck supple. No thyromegaly present.  No meningismus  Cardiovascular: Normal rate, regular rhythm, S1 normal, S2 normal and intact distal pulses.   Pulmonary/Chest: Effort normal and breath sounds normal.  Abdominal: Soft. Bowel sounds are normal. There is no tenderness. There is no CVA tenderness.  Musculoskeletal: Normal range of motion.  Neurological: She is alert and oriented to person, place, and time. She has normal strength and normal reflexes. No cranial nerve deficit or sensory deficit. She displays a negative Romberg sign. GCS eye subscore is 4. GCS verbal subscore is 5. GCS motor subscore is 6.  Normal  Gait  Skin: Skin is warm and dry. No rash noted. No cyanosis. Nails show no clubbing.  Psychiatric: She has a normal mood and affect. Her speech is normal and behavior is normal.    ED Course  Procedures (including critical care time)  IVFs, IV magnesium, IV phenergan  For recheck still having headache and medications provider: Benadryl, Decadron, Reglan  4:02 AM on recheck feeling much better requesting to be discharged home. No seizure activity in the ED. As outpatient neurology followup. His home medications as prescribed. Return precautions provided and stable for discharge at this time. No neuro deficits. MDM  Diagnosis: Headache  History of migraines and seizures on Keppra Treated with IV fluids and medications as above with condition improved No neuro deficits serial exams Vital signs and nursing notes reviewed and considered   Sunnie Nielsen, MD 07/29/13 [phone]

## 2013-11-06 ENCOUNTER — Emergency Department (HOSPITAL_COMMUNITY): Payer: Medicare Other

## 2013-11-06 ENCOUNTER — Emergency Department (HOSPITAL_COMMUNITY)
Admission: EM | Admit: 2013-11-06 | Discharge: 2013-11-06 | Disposition: A | Discharge status: Home / Self Care | Payer: Medicare Other | Attending: Emergency Medicine | Admitting: Emergency Medicine

## 2013-11-06 ENCOUNTER — Encounter (HOSPITAL_COMMUNITY): Payer: Self-pay | Admitting: Emergency Medicine

## 2013-11-06 DIAGNOSIS — F17200 Nicotine dependence, unspecified, uncomplicated: Secondary | ICD-10-CM | POA: Insufficient documentation

## 2013-11-06 DIAGNOSIS — J029 Acute pharyngitis, unspecified: Secondary | ICD-10-CM | POA: Insufficient documentation

## 2013-11-06 DIAGNOSIS — R5381 Other malaise: Secondary | ICD-10-CM | POA: Insufficient documentation

## 2013-11-06 DIAGNOSIS — R110 Nausea: Secondary | ICD-10-CM | POA: Insufficient documentation

## 2013-11-06 DIAGNOSIS — R5383 Other fatigue: Secondary | ICD-10-CM

## 2013-11-06 DIAGNOSIS — R6889 Other general symptoms and signs: Secondary | ICD-10-CM

## 2013-11-06 DIAGNOSIS — B9789 Other viral agents as the cause of diseases classified elsewhere: Secondary | ICD-10-CM | POA: Insufficient documentation

## 2013-11-06 DIAGNOSIS — R630 Anorexia: Secondary | ICD-10-CM | POA: Insufficient documentation

## 2013-11-06 DIAGNOSIS — Z3202 Encounter for pregnancy test, result negative: Secondary | ICD-10-CM | POA: Insufficient documentation

## 2013-11-06 DIAGNOSIS — G40909 Epilepsy, unspecified, not intractable, without status epilepticus: Secondary | ICD-10-CM | POA: Insufficient documentation

## 2013-11-06 DIAGNOSIS — Z79899 Other long term (current) drug therapy: Secondary | ICD-10-CM | POA: Insufficient documentation

## 2013-11-06 DIAGNOSIS — R509 Fever, unspecified: Secondary | ICD-10-CM | POA: Insufficient documentation

## 2013-11-06 DIAGNOSIS — R6883 Chills (without fever): Secondary | ICD-10-CM | POA: Insufficient documentation

## 2013-11-06 DIAGNOSIS — F411 Generalized anxiety disorder: Secondary | ICD-10-CM | POA: Insufficient documentation

## 2013-11-06 LAB — COMPREHENSIVE METABOLIC PANEL
ALK PHOS: 87 U/L (ref 39–117)
ALT: 14 U/L (ref 0–35)
AST: 15 U/L (ref 0–37)
Albumin: 3.8 g/dL (ref 3.5–5.2)
BILIRUBIN TOTAL: 0.4 mg/dL (ref 0.3–1.2)
BUN: 9 mg/dL (ref 6–23)
CO2: 20 meq/L (ref 19–32)
Calcium: 9.2 mg/dL (ref 8.4–10.5)
Chloride: 102 mEq/L (ref 96–112)
Creatinine, Ser: 0.76 mg/dL (ref 0.50–1.10)
GFR calc non Af Amer: >90 mL/min (ref >90)
GLUCOSE: 102 mg/dL — AB (ref 70–99)
POTASSIUM: 3.6 meq/L — AB (ref 3.7–5.3)
Sodium: 138 mEq/L (ref 137–147)
TOTAL PROTEIN: 7.7 g/dL (ref 6.0–8.3)

## 2013-11-06 LAB — CBC WITH DIFFERENTIAL/PLATELET
Basophils Absolute: 0 10*3/uL (ref 0.0–0.1)
Basophils Relative: 0 % (ref 0–1)
Eosinophils Absolute: 0.1 10*3/uL (ref 0.0–0.7)
Eosinophils Relative: 1 % (ref 0–5)
HCT: 44.5 % (ref 36.0–46.0)
HEMOGLOBIN: 15.5 g/dL — AB (ref 12.0–15.0)
LYMPHS ABS: 3.4 10*3/uL (ref 0.7–4.0)
LYMPHS PCT: 32 % (ref 12–46)
MCH: 33.5 pg (ref 26.0–34.0)
MCHC: 34.8 g/dL (ref 30.0–36.0)
MCV: 96.3 fL (ref 78.0–100.0)
MONOS PCT: 6 % (ref 3–12)
Monocytes Absolute: 0.6 10*3/uL (ref 0.1–1.0)
NEUTROS PCT: 61 % (ref 43–77)
Neutro Abs: 6.3 10*3/uL (ref 1.7–7.7)
Platelets: 292 10*3/uL (ref 150–400)
RBC: 4.62 MIL/uL (ref 3.87–5.11)
RDW: 14.3 % (ref 11.5–15.5)
WBC: 10.5 10*3/uL (ref 4.0–10.5)

## 2013-11-06 LAB — POC URINE PREG, ED: Preg Test, Ur: NEGATIVE

## 2013-11-06 LAB — URINALYSIS, ROUTINE W REFLEX MICROSCOPIC
BILIRUBIN URINE: NEGATIVE
Glucose, UA: NEGATIVE mg/dL
HGB URINE DIPSTICK: NEGATIVE
Ketones, ur: NEGATIVE mg/dL
NITRITE: NEGATIVE
PH: 5.5 (ref 5.0–8.0)
Protein, ur: NEGATIVE mg/dL
SPECIFIC GRAVITY, URINE: 1.014 (ref 1.005–1.030)
Urobilinogen, UA: 1 mg/dL (ref 0.0–1.0)

## 2013-11-06 LAB — URINE MICROSCOPIC-ADD ON

## 2013-11-06 MED ORDER — HYDROCODONE-ACETAMINOPHEN 7.5-325 MG/15ML PO SOLN: 15.0000 mL | Freq: Three times a day (TID) | ORAL | Status: DC | PRN

## 2013-11-06 MED ORDER — LEVETIRACETAM 500 MG PO TABS
1000.0000 mg | ORAL_TABLET | Freq: Once | ORAL | Status: AC
  Administered 2013-11-06: 1000 mg via ORAL
  Filled 2013-11-06: qty 2

## 2013-11-06 MED ORDER — ONDANSETRON 4 MG PO TBDP: 2.0000 mg | ORAL_TABLET | Freq: Three times a day (TID) | ORAL | Status: DC | PRN

## 2013-11-06 MED ORDER — ONDANSETRON 4 MG PO TBDP
8.0000 mg | ORAL_TABLET | Freq: Once | ORAL | Status: AC
  Administered 2013-11-06: 8 mg via ORAL
  Filled 2013-11-06: qty 2

## 2013-11-06 NOTE — ED Notes (Signed)
Pt states not feeling well since Sunday nite.  Pt states she aches all over, has chills, pt states she is so tired all the time.  Pt states that she was able to take her clonazepam.  Pt states that she felt better after taking shower.  Pt states stomach feels like it is on Friday. Pt reports nausea and denies vomiting

## 2013-11-06 NOTE — ED Notes (Signed)
Nurse First Rounds : Nurse explained delay , wait time and process to pt. Respirations unlabored / no distress.  

## 2013-11-06 NOTE — ED Notes (Signed)
Returned from xray

## 2013-11-06 NOTE — Discharge Instructions (Signed)
Take your daily medications as they have been prescribed to, including your Keppra. Recommend your daily dose of Norco as needed for pain and body aches. Takes Zofran as needed for nausea. You may take Hycet as needed for sore throat/cough. Followup with your primary care doctor to ensure the symptoms resolve. Return if symptoms worsen.  Influenza, Adult Influenza ("the flu") is a viral infection of the respiratory tract. It occurs more often in winter months because people spend more time in close contact with one another. Influenza can make you feel very sick. Influenza easily spreads from person to person (contagious). CAUSES  Influenza is caused by a virus that infects the respiratory tract. You can catch the virus by breathing in droplets from an infected person's cough or sneeze. You can also catch the virus by touching something that was recently contaminated with the virus and then touching your mouth, nose, or eyes. SYMPTOMS  Symptoms typically last 4 to 10 days and may include:  Fever.  Chills.  Headache, body aches, and muscle aches.  Sore throat.  Chest discomfort and cough.  Poor appetite.  Weakness or feeling tired.  Dizziness.  Nausea or vomiting. DIAGNOSIS  Diagnosis of influenza is often made based on your history and a physical exam. A nose or throat swab test can be done to confirm the diagnosis. RISKS AND COMPLICATIONS You may be at risk for a more severe case of influenza if you smoke cigarettes, have diabetes, have chronic heart disease (such as heart failure) or lung disease (such as asthma), or if you have a weakened immune system. Elderly people and pregnant women are also at risk for more serious infections. The most common complication of influenza is a lung infection (pneumonia). Sometimes, this complication can require emergency medical care and may be life-threatening. PREVENTION  An annual influenza vaccination (flu shot) is the best way to avoid getting  influenza. An annual flu shot is now routinely recommended for all adults in the [place] TREATMENT  In mild cases, influenza goes away on its own. Treatment is directed at relieving symptoms. For more severe cases, your caregiver may prescribe antiviral medicines to shorten the sickness. Antibiotic medicines are not effective, because the infection is caused by a virus, not by bacteria. HOME CARE INSTRUCTIONS  Only take over-the-counter or prescription medicines for pain, discomfort, or fever as directed by your caregiver.  Use a cool mist humidifier to make breathing easier.  Get plenty of rest until your temperature returns to normal. This usually takes 3 to 4 days.  Drink enough fluids to keep your urine clear or pale yellow.  Cover your mouth and nose when coughing or sneezing, and wash your hands well to avoid spreading the virus.  Stay home from work or school until your fever has been gone for at least 1 full day. SEEK MEDICAL CARE IF:   You have chest pain or a deep cough that worsens or produces more mucus.  You have nausea, vomiting, or diarrhea. SEEK IMMEDIATE MEDICAL CARE IF:   You have difficulty breathing, shortness of breath, or your skin or nails turn bluish.  You have severe neck pain or stiffness.  You have a severe headache, facial pain, or earache.  You have a worsening or recurring fever.  You have nausea or vomiting that cannot be controlled. MAKE SURE YOU:  Understand these instructions.  Will watch your condition.  Will get help right away if you are not doing well or get worse. Document Released: 08/06/2000  Document Revised: 02/08/2012 Document Reviewed: 11/08/2011 [facility] Patient Information 2014 [place], [place].

## 2013-11-07 NOTE — ED Provider Notes (Signed)
CSN: [id number]     Arrival date & time 11/06/13  1643 History   First MD Initiated Contact with Patient 11/06/13 2212     No chief complaint on file.   (Consider location/radiation/quality/duration/timing/severity/associated sxs/prior Treatment) HPI Comments: Patient is a 38 year old female with a history of seizures, migraine headaches, and anxiety who presents to the emergency department for myalgias, fatigue and malaise, and chills. Patient states her symptoms have been present x2 days. She states she has taken Tylenol for symptoms without relief. She states she feels a bit better when taking a warm shower. Symptoms also associated with low-grade temperature of 100F, nausea, anorexia, and a sore throat. She states she thinks she has the flu, but denies sick contacts. Patient denies cough, SOB, V/D, dysuria, numbness/tingling, extremity weakness, seizure activity, and syncope.  The history is provided by the patient. No language interpreter was used.    Past Medical History  Diagnosis Date  . Migraine   . Seizures   . Anxiety   . Anxiety    Past Surgical History  Procedure Laterality Date  . Cesarean section    . Cholecystectomy    . Tubal ligation    . Endometrial ablation     No family history on file. History  Substance Use Topics  . Smoking status: Current Every Day Smoker -- 1.50 packs/day    Types: Cigarettes  . Smokeless tobacco: Not on file  . Alcohol Use: No   OB History   Grav Para Term Preterm Abortions TAB SAB Ect Mult Living                  Review of Systems  Constitutional: Positive for chills and fatigue. Negative for fever.       +malaise +anorexia  HENT: Positive for sore throat.   Respiratory: Negative for shortness of breath.   Cardiovascular: Negative for chest pain.  Gastrointestinal: Positive for nausea. Negative for vomiting, abdominal pain and diarrhea.  Musculoskeletal: Positive for myalgias.  Neurological: Negative for seizures, syncope,  weakness and numbness.  All other systems reviewed and are negative.     Allergies  Demerol; Sumatriptan; and Ibuprofen  Home Medications   Current Outpatient Rx  Name  Route  Sig  Dispense  Refill  . albuterol (PROVENTIL HFA;VENTOLIN HFA) 108 (90 BASE) MCG/ACT inhaler   Inhalation   Inhale 2 puffs into the lungs every 6 (six) hours as needed for wheezing or shortness of breath.          . clonazePAM (KLONOPIN) 0.5 MG tablet   Oral   Take 0.5-1 mg by mouth See admin instructions. Takes 1 tablet three times daily and 2 tablets at bedtime         . gabapentin (NEURONTIN) 600 MG tablet   Oral   Take 600 mg by mouth 4 (four) times daily - after meals and at bedtime.          Marland Kitchen HYDROcodone-acetaminophen (NORCO/VICODIN) 5-325 MG per tablet   Oral   Take 1 tablet by mouth every 6 (six) hours as needed for moderate pain.         Marland Kitchen levETIRAcetam (KEPPRA) 1000 MG tablet   Oral   Take 1,500 mg by mouth 2 (two) times daily.          . mirtazapine (REMERON) 45 MG tablet   Oral   Take 45 mg by mouth at bedtime.         Marland Kitchen zonisamide (ZONEGRAN) 100 MG capsule   Oral  Take 100 mg by mouth at bedtime.         Marland Kitchen HYDROcodone-acetaminophen (HYCET) 7.5-325 mg/15 ml solution   Oral   Take 15 mLs by mouth every 8 (eight) hours as needed for moderate pain (For sore throat/cough as needed).   120 mL   0   . ondansetron (ZOFRAN ODT) 4 MG disintegrating tablet   Oral   Take 0.5 tablets (2 mg total) by mouth every 8 (eight) hours as needed.   10 tablet   0    BP 90/57  Pulse 88  Temp(Src) 98.1 F (36.7 C) (Oral)  Resp 20  Wt 167 lb (75.751 kg)  SpO2 94%  LMP 10/20/2013  Physical Exam  Nursing note and vitals reviewed. Constitutional: She is oriented to person, place, and time. She appears well-developed and well-nourished. No distress.  Patient nontoxic and nonseptic appearing. She is in NAD.  HENT:  Head: Normocephalic and atraumatic.  Mouth/Throat: No  oropharyngeal exudate.  Oropharynx clear; dry mm.  Eyes: Conjunctivae and EOM are normal. Pupils are equal, round, and reactive to light. No scleral icterus.  Neck: Normal range of motion.  Cardiovascular: Normal rate, regular rhythm and normal heart sounds.   Pulmonary/Chest: Effort normal and breath sounds normal. No respiratory distress. She has no wheezes. She has no rales.  No retractions or accessory muscle use. Chest expansion symmetric.  Abdominal: Soft. She exhibits no distension. There is no tenderness. There is no rebound and no guarding.  No focal tenderness, peritoneal signs, or guarding.  Musculoskeletal: Normal range of motion.  Neurological: She is alert and oriented to person, place, and time.  GCS 15. Speech is goal oriented. Patient moves extremities without ataxia.  Skin: Skin is warm and dry. No rash noted. She is not diaphoretic. No erythema. No pallor.  Psychiatric: She has a normal mood and affect. Her behavior is normal.    ED Course  Procedures (including critical care time) Labs Review Labs Reviewed  URINALYSIS, ROUTINE W REFLEX MICROSCOPIC - Abnormal; Notable for the following:    APPearance CLOUDY (*)    Leukocytes, UA TRACE (*)    All other components within normal limits  CBC WITH DIFFERENTIAL - Abnormal; Notable for the following:    Hemoglobin 15.5 (*)    All other components within normal limits  COMPREHENSIVE METABOLIC PANEL - Abnormal; Notable for the following:    Potassium 3.6 (*)    Glucose, Bld 102 (*)    All other components within normal limits  URINE MICROSCOPIC-ADD ON - Abnormal; Notable for the following:    Squamous Epithelial / LPF FEW (*)    All other components within normal limits  POC URINE PREG, ED   Imaging Review Dg Chest 2 View  11/06/2013   CLINICAL DATA:  Shortness of breath and fever.  EXAM: CHEST  2 VIEW  COMPARISON:  Single view of the chest 07/05/2013 and 09/29/2012.  FINDINGS: Streaky bibasilar airspace opacities have  an appearance most compatible with atelectasis. The lungs are otherwise clear. Heart size is normal. No pneumothorax or pleural effusion.  IMPRESSION: Streaky basilar airspace opacities most compatible with atelectasis.   Electronically Signed   By: Inge Rise M.D.   On: 11/06/2013 22:39     EKG Interpretation None      MDM   Final diagnoses:  Flu-like symptoms    38 year old female presents for flu-like symptoms. She is afebrile, hemodynamically stable, and nonseptic/nontoxic appearing. Physical exam today without any significant findings. Lungs clear to  auscultation bilaterally and heart RRR. Abdomen soft and nontender without peritoneal signs. Patient today has no leukocytosis, anemia, or electrolyte imbalance. Liver and kidney function preserved and urinalysis does not suggest infection. Urine pregnancy negative. Chest x-ray also obtained which shows no evidence of focal consolidation or pneumonia; findings consistent with atelectasis. I do not believe further emergent workup is indicated at this time. Patient given her nightly dose of seizure medication in ED as well as Zofran for nausea. Have advised primary care followup and will prescribe high set for sore throat and Zofran for nausea. Rest and fluid hydration stressed to patient. She has been told to take Norco for her myalgias. Return precautions provided and patient agreeable to plan with no unaddressed concerns.   Filed Vitals:   11/06/13 2139 11/06/13 2200 11/06/13 2300 11/06/13 2328  BP: 96/63 102/61 96/63 90/57   Pulse: 82 91 88   Temp: 98.1 F (36.7 C)     TempSrc: Oral     Resp: 20     Weight:      SpO2: 98% 94% 94%        Antonietta Breach, PA-C 11/07/13 0015

## 2013-11-07 NOTE — ED Provider Notes (Signed)
Medical screening examination/treatment/procedure(s) were performed by non-physician practitioner and as supervising physician I was immediately available for consultation/collaboration.   EKG Interpretation None       Orlie Dakin, MD 11/07/13 0028

## 2014-01-11 ENCOUNTER — Emergency Department (HOSPITAL_COMMUNITY)
Admission: EM | Admit: 2014-01-11 | Discharge: 2014-01-12 | Disposition: A | Discharge status: Home / Self Care | Payer: Medicare Other | Attending: Emergency Medicine | Admitting: Emergency Medicine

## 2014-01-11 ENCOUNTER — Encounter (HOSPITAL_COMMUNITY): Payer: Self-pay | Admitting: Emergency Medicine

## 2014-01-11 DIAGNOSIS — G8929 Other chronic pain: Secondary | ICD-10-CM | POA: Insufficient documentation

## 2014-01-11 DIAGNOSIS — G43909 Migraine, unspecified, not intractable, without status migrainosus: Secondary | ICD-10-CM | POA: Insufficient documentation

## 2014-01-11 DIAGNOSIS — G40909 Epilepsy, unspecified, not intractable, without status epilepticus: Secondary | ICD-10-CM | POA: Insufficient documentation

## 2014-01-11 DIAGNOSIS — M545 Low back pain, unspecified: Secondary | ICD-10-CM | POA: Insufficient documentation

## 2014-01-11 DIAGNOSIS — M79609 Pain in unspecified limb: Secondary | ICD-10-CM | POA: Insufficient documentation

## 2014-01-11 DIAGNOSIS — M549 Dorsalgia, unspecified: Secondary | ICD-10-CM

## 2014-01-11 DIAGNOSIS — E669 Obesity, unspecified: Secondary | ICD-10-CM | POA: Insufficient documentation

## 2014-01-11 DIAGNOSIS — Z79899 Other long term (current) drug therapy: Secondary | ICD-10-CM | POA: Insufficient documentation

## 2014-01-11 DIAGNOSIS — F411 Generalized anxiety disorder: Secondary | ICD-10-CM | POA: Insufficient documentation

## 2014-01-11 DIAGNOSIS — F17200 Nicotine dependence, unspecified, uncomplicated: Secondary | ICD-10-CM | POA: Insufficient documentation

## 2014-01-11 HISTORY — DX: Reserved for concepts with insufficient information to code with codable children: IMO0002

## 2014-01-11 LAB — CBC WITH DIFFERENTIAL/PLATELET
BASOS PCT: 0 % (ref 0–1)
Basophils Absolute: 0 10*3/uL (ref 0.0–0.1)
EOS PCT: 2 % (ref 0–5)
Eosinophils Absolute: 0.2 10*3/uL (ref 0.0–0.7)
HEMATOCRIT: 43.1 % (ref 36.0–46.0)
HEMOGLOBIN: 14.6 g/dL (ref 12.0–15.0)
Lymphocytes Relative: 45 % (ref 12–46)
Lymphs Abs: 4.5 10*3/uL — ABNORMAL HIGH (ref 0.7–4.0)
MCH: 33.8 pg (ref 26.0–34.0)
MCHC: 33.9 g/dL (ref 30.0–36.0)
MCV: 99.8 fL (ref 78.0–100.0)
MONO ABS: 0.6 10*3/uL (ref 0.1–1.0)
MONOS PCT: 6 % (ref 3–12)
Neutro Abs: 4.7 10*3/uL (ref 1.7–7.7)
Neutrophils Relative %: 47 % (ref 43–77)
Platelets: 323 10*3/uL (ref 150–400)
RBC: 4.32 MIL/uL (ref 3.87–5.11)
RDW: 12.8 % (ref 11.5–15.5)
WBC: 10 10*3/uL (ref 4.0–10.5)

## 2014-01-11 LAB — I-STAT CG4 LACTIC ACID, ED: Lactic Acid, Venous: 0.92 mmol/L (ref 0.5–2.2)

## 2014-01-11 MED ORDER — SODIUM CHLORIDE 0.9 % IV BOLUS (SEPSIS)
1000.0000 mL | Freq: Once | INTRAVENOUS | Status: AC
  Administered 2014-01-11: 1000 mL via INTRAVENOUS

## 2014-01-11 MED ORDER — TRAMADOL HCL 50 MG PO TABS
50.0000 mg | ORAL_TABLET | Freq: Once | ORAL | Status: AC
  Administered 2014-01-11: 50 mg via ORAL
  Filled 2014-01-11: qty 1

## 2014-01-11 NOTE — ED Notes (Signed)
Pt states chronic leg and back pain that has worsened over the past week.  Pt states taking OTC pain medicaiton and using a heating pad without relief

## 2014-01-11 NOTE — ED Provider Notes (Signed)
CSN: [id number]     Arrival date & time 01/11/14  1841 History   First MD Initiated Contact with Patient 01/11/14 2259     Chief Complaint  Patient presents with  . Back Pain  . Leg Pain     (Consider location/radiation/quality/duration/timing/severity/associated sxs/prior Treatment) HPI [name] is a 38 yo woman who reports that she has chronic back pain as the result of degenerative disc disease. She says that she is only 37 but that her life is consumed back her back pain. Sometimes she feels like she needs to stay in bed all day because of the pain. She says that she is not enjoying the life she wishes to with her children because of the pain. She wants the pain to stop.   The patient is not obese nor does she have a history of significant spine trauma in the past. She says that DDD was diagnosed by a chiropractor who did xrays and told her that her "discs were paper thin". He told her that he had to be careful with her during manipulations because he might break her discs. Patient was seeing this chiropractor 3 times per week for a few months this past winter but stopped because she didn't feel like she was getting adequate relief.   She has taken ibuprofen and Tylenol XS without adequate pain relief. She says she wants an end to her pain. Nothing makes pain worse or better. Pain sometimes radiates into her buttocks. But she denies motor weakness and paresthesias. No Gu sx. No fever.   Past Medical History  Diagnosis Date  . Migraine   . Seizures   . Anxiety   . Anxiety   . DDD (degenerative disc disease)    Past Surgical History  Procedure Laterality Date  . Cesarean section    . Cholecystectomy    . Tubal ligation    . Endometrial ablation     No family history on file. History  Substance Use Topics  . Smoking status: Current Every Day Smoker -- 1.50 packs/day    Types: Cigarettes  . Smokeless tobacco: Not on file  . Alcohol Use: No   OB History   Grav Para Term  Preterm Abortions TAB SAB Ect Mult Living                 Review of Systems\   Ten point review of symptoms performed and is negative with the exception of symptoms noted above.   Allergies  Demerol; Sumatriptan; and Ibuprofen  Home Medications   Prior to Admission medications   Medication Sig Start Date End Date Taking? Authorizing Provider  albuterol (PROVENTIL HFA;VENTOLIN HFA) 108 (90 BASE) MCG/ACT inhaler Inhale 2 puffs into the lungs every 6 (six) hours as needed for wheezing or shortness of breath.    Yes Historical Provider, MD  clonazePAM (KLONOPIN) 0.5 MG tablet Take 0.5-1 mg by mouth See admin instructions. Takes 1 tablet three times daily and 2 tablets at bedtime   Yes Historical Provider, MD  gabapentin (NEURONTIN) 600 MG tablet Take 600 mg by mouth 4 (four) times daily - after meals and at bedtime.    Yes Historical Provider, MD  levETIRAcetam (KEPPRA) 1000 MG tablet Take 1,000 mg by mouth 2 (two) times daily.    Yes Historical Provider, MD  mirtazapine (REMERON) 45 MG tablet Take 45 mg by mouth at bedtime.   Yes Historical Provider, MD  ondansetron (ZOFRAN ODT) 4 MG disintegrating tablet Take 0.5 tablets (2 mg total) by  mouth every 8 (eight) hours as needed. 11/06/13  Yes Antonietta Breach, PA-C  zonisamide (ZONEGRAN) 100 MG capsule Take 100 mg by mouth at bedtime.   Yes Historical Provider, MD   BP 80/57  Pulse 85  Temp(Src) 98.2 F (36.8 C) (Oral)  Resp 16  Ht 5\' 1"  (1.549 m)  Wt 160 lb 14.4 oz (72.984 kg)  BMI 30.42 kg/m2  SpO2 99% Physical Exam Gen: well developed and well nourished appearing Head: NCAT Eyes: PERL, EOMI Nose: no epistaixis or rhinorrhea Mouth/throat: mucosa is moist and pink Neck: supple, no stridor Lungs: CTA B, no wheezing, rhonchi or rales CV: RRR, no murmur, extremities appear well perfused.  Abd: soft, notender, nondistended Back: no ttp, no cva ttp, ttp and palpable muscle tension over the paraspinal muscles in the lumbar region Skin:  warm and dry Ext: normal to inspection, no dependent edema Neuro: CN ii-xii grossly intact, no focal deficits, 5/5 motor strength all major muscle groups both LE, DTRs brisk and symmetric at  Patellar tendons bilaterally.  Psyche; flatl affect,  calm and cooperative.   ED Course  Procedures (including critical care time) Labs Review  Results for orders placed during the hospital encounter of 01/11/14 (from the past 24 hour(s))  CBC WITH DIFFERENTIAL     Status: Abnormal   Collection Time    01/11/14 11:10 PM      Result Value Ref Range   WBC 10.0  4.0 - 10.5 K/uL   RBC 4.32  3.87 - 5.11 MIL/uL   Hemoglobin 14.6  12.0 - 15.0 g/dL   HCT 43.1  36.0 - 46.0 %   MCV 99.8  78.0 - 100.0 fL   MCH 33.8  26.0 - 34.0 pg   MCHC 33.9  30.0 - 36.0 g/dL   RDW 12.8  11.5 - 15.5 %   Platelets 323  150 - 400 K/uL   Neutrophils Relative % 47  43 - 77 %   Neutro Abs 4.7  1.7 - 7.7 K/uL   Lymphocytes Relative 45  12 - 46 %   Lymphs Abs 4.5 (*) 0.7 - 4.0 K/uL   Monocytes Relative 6  3 - 12 %   Monocytes Absolute 0.6  0.1 - 1.0 K/uL   Eosinophils Relative 2  0 - 5 %   Eosinophils Absolute 0.2  0.0 - 0.7 K/uL   Basophils Relative 0  0 - 1 %   Basophils Absolute 0.0  0.0 - 0.1 K/uL  I-STAT CG4 LACTIC ACID, ED     Status: None   Collection Time    01/11/14 11:28 PM      Result Value Ref Range   Lactic Acid, Venous 0.92  0.5 - 2.2 mmol/L     MDM   Patient with chronic low back pain. Seems to have a significant myofascial component. Unable to exclude or diagnose DDD and is it sounds like the patient has actually not had an adequate evaluation for this in the past. Nonetheless,  I believe that her pain can be managed conservatively. I have encouraged her to increase her activity level and suggested yoga therapy to her with a referral to a local studio.     Elyn Peers, MD 01/12/14 [phone]

## 2014-01-12 LAB — BASIC METABOLIC PANEL
BUN: 7 mg/dL (ref 6–23)
CHLORIDE: 106 meq/L (ref 96–112)
CO2: 21 meq/L (ref 19–32)
Calcium: 8.8 mg/dL (ref 8.4–10.5)
Creatinine, Ser: 0.8 mg/dL (ref 0.50–1.10)
GFR calc Af Amer: >90 mL/min (ref >90)
GFR calc non Af Amer: >90 mL/min (ref >90)
Glucose, Bld: 89 mg/dL (ref 70–99)
Potassium: 3.8 mEq/L (ref 3.7–5.3)
Sodium: 140 mEq/L (ref 137–147)

## 2014-01-12 MED ORDER — TRAMADOL HCL 50 MG PO TABS: 50.0000 mg | ORAL_TABLET | Freq: Four times a day (QID) | ORAL | Status: DC | PRN

## 2014-01-12 NOTE — Discharge Instructions (Signed)
Back Pain, Adult Low back pain is very common. About 1 in 5 people have back pain.The cause of low back pain is rarely dangerous. The pain often gets better over time.About half of people with a sudden onset of back pain feel better in just 2 weeks. About 8 in 10 people feel better by 6 weeks.  CAUSES Some common causes of back pain include:  Strain of the muscles or ligaments supporting the spine.  Wear and tear (degeneration) of the spinal discs.  Arthritis.  Direct injury to the back. DIAGNOSIS Most of the time, the direct cause of low back pain is not known.However, back pain can be treated effectively even when the exact cause of the pain is unknown.Answering your caregiver's questions about your overall health and symptoms is one of the most accurate ways to make sure the cause of your pain is not dangerous. If your caregiver needs more information, he or she may order lab work or imaging tests (X-rays or MRIs).However, even if imaging tests show changes in your back, this usually does not require surgery. HOME CARE INSTRUCTIONS For many people, back pain returns.Since low back pain is rarely dangerous, it is often a condition that people can learn to manageon their own.   Remain active. It is stressful on the back to sit or stand in one place. Do not sit, drive, or stand in one place for more than 30 minutes at a time. Take short walks on level surfaces as soon as pain allows.Try to increase the length of time you walk each day.  Do not stay in bed.Resting more than 1 or 2 days can delay your recovery.  Do not avoid exercise or work.Your body is made to move.It is not dangerous to be active, even though your back may hurt.Your back will likely heal faster if you return to being active before your pain is gone.  Pay attention to your body when you bend and lift. Many people have less discomfortwhen lifting if they bend their knees, keep the load close to their bodies,and  avoid twisting. Often, the most comfortable positions are those that put less stress on your recovering back.  Find a comfortable position to sleep. Use a firm mattress and lie on your side with your knees slightly bent. If you lie on your back, put a pillow under your knees.  Only take over-the-counter or prescription medicines as directed by your caregiver. Over-the-counter medicines to reduce pain and inflammation are often the most helpful.Your caregiver may prescribe muscle relaxant drugs.These medicines help dull your pain so you can more quickly return to your normal activities and healthy exercise.  Put ice on the injured area.  Put ice in a plastic bag.  Place a towel between your skin and the bag.  Leave the ice on for 15-20 minutes, 03-04 times a day for the first 2 to 3 days. After that, ice and heat may be alternated to reduce pain and spasms.  Ask your caregiver about trying back exercises and gentle massage. This may be of some benefit.  Avoid feeling anxious or stressed.Stress increases muscle tension and can worsen back pain.It is important to recognize when you are anxious or stressed and learn ways to manage it.Exercise is a great option. SEEK MEDICAL CARE IF:  You have pain that is not relieved with rest or medicine.  You have pain that does not improve in 1 week.  You have new symptoms.  You are generally not feeling well. SEEK   IMMEDIATE MEDICAL CARE IF:   You have pain that radiates from your back into your legs.  You develop new bowel or bladder control problems.  You have unusual weakness or numbness in your arms or legs.  You develop nausea or vomiting.  You develop abdominal pain.  You feel faint. Document Released: 08/09/2005 Document Revised: 02/08/2012 Document Reviewed: 12/28/2010 [employer] Patient Information 2014 [employer].  

## 2014-05-09 ENCOUNTER — Encounter (HOSPITAL_COMMUNITY): Payer: Self-pay | Admitting: Emergency Medicine

## 2014-05-09 ENCOUNTER — Emergency Department (HOSPITAL_COMMUNITY)
Admission: EM | Admit: 2014-05-09 | Discharge: 2014-05-10 | Disposition: A | Discharge status: Home / Self Care | Payer: Medicare Other | Attending: Emergency Medicine | Admitting: Emergency Medicine

## 2014-05-09 DIAGNOSIS — G40909 Epilepsy, unspecified, not intractable, without status epilepticus: Secondary | ICD-10-CM | POA: Diagnosis not present

## 2014-05-09 DIAGNOSIS — Z8739 Personal history of other diseases of the musculoskeletal system and connective tissue: Secondary | ICD-10-CM | POA: Diagnosis not present

## 2014-05-09 DIAGNOSIS — Z3202 Encounter for pregnancy test, result negative: Secondary | ICD-10-CM | POA: Insufficient documentation

## 2014-05-09 DIAGNOSIS — R519 Headache, unspecified: Secondary | ICD-10-CM

## 2014-05-09 DIAGNOSIS — Z79899 Other long term (current) drug therapy: Secondary | ICD-10-CM | POA: Diagnosis not present

## 2014-05-09 DIAGNOSIS — F411 Generalized anxiety disorder: Secondary | ICD-10-CM | POA: Diagnosis not present

## 2014-05-09 DIAGNOSIS — G43809 Other migraine, not intractable, without status migrainosus: Secondary | ICD-10-CM | POA: Insufficient documentation

## 2014-05-09 DIAGNOSIS — F17200 Nicotine dependence, unspecified, uncomplicated: Secondary | ICD-10-CM | POA: Insufficient documentation

## 2014-05-09 DIAGNOSIS — R51 Headache: Secondary | ICD-10-CM

## 2014-05-09 DIAGNOSIS — G43909 Migraine, unspecified, not intractable, without status migrainosus: Secondary | ICD-10-CM | POA: Insufficient documentation

## 2014-05-09 LAB — CBC WITH DIFFERENTIAL/PLATELET
BASOS ABS: 0 10*3/uL (ref 0.0–0.1)
Basophils Relative: 0 % (ref 0–1)
EOS ABS: 0.2 10*3/uL (ref 0.0–0.7)
EOS PCT: 2 % (ref 0–5)
HCT: 40.7 % (ref 36.0–46.0)
Hemoglobin: 13.6 g/dL (ref 12.0–15.0)
LYMPHS PCT: 43 % (ref 12–46)
Lymphs Abs: 3.9 10*3/uL (ref 0.7–4.0)
MCH: 31.6 pg (ref 26.0–34.0)
MCHC: 33.4 g/dL (ref 30.0–36.0)
MCV: 94.4 fL (ref 78.0–100.0)
Monocytes Absolute: 0.4 10*3/uL (ref 0.1–1.0)
Monocytes Relative: 4 % (ref 3–12)
Neutro Abs: 4.6 10*3/uL (ref 1.7–7.7)
Neutrophils Relative %: 51 % (ref 43–77)
PLATELETS: 299 10*3/uL (ref 150–400)
RBC: 4.31 MIL/uL (ref 3.87–5.11)
RDW: 12.3 % (ref 11.5–15.5)
WBC: 9.2 10*3/uL (ref 4.0–10.5)

## 2014-05-09 LAB — BASIC METABOLIC PANEL
ANION GAP: 13 (ref 5–15)
BUN: 8 mg/dL (ref 6–23)
CALCIUM: 8.9 mg/dL (ref 8.4–10.5)
CO2: 25 mEq/L (ref 19–32)
Chloride: 105 mEq/L (ref 96–112)
Creatinine, Ser: 0.67 mg/dL (ref 0.50–1.10)
GFR calc Af Amer: >90 mL/min (ref >90)
Glucose, Bld: 92 mg/dL (ref 70–99)
Potassium: 3.6 mEq/L — ABNORMAL LOW (ref 3.7–5.3)
SODIUM: 143 meq/L (ref 137–147)

## 2014-05-09 LAB — POC URINE PREG, ED: Preg Test, Ur: NEGATIVE

## 2014-05-09 MED ORDER — DEXAMETHASONE SODIUM PHOSPHATE 10 MG/ML IJ SOLN
10.0000 mg | Freq: Once | INTRAMUSCULAR | Status: AC
  Administered 2014-05-09: 10 mg via INTRAVENOUS
  Filled 2014-05-09: qty 1

## 2014-05-09 MED ORDER — METOCLOPRAMIDE HCL 5 MG/ML IJ SOLN
10.0000 mg | INTRAMUSCULAR | Status: AC
  Administered 2014-05-09: 10 mg via INTRAVENOUS
  Filled 2014-05-09: qty 2

## 2014-05-09 MED ORDER — SODIUM CHLORIDE 0.9 % IV BOLUS (SEPSIS)
1000.0000 mL | Freq: Once | INTRAVENOUS | Status: AC
  Administered 2014-05-09: 1000 mL via INTRAVENOUS

## 2014-05-09 MED ORDER — DIPHENHYDRAMINE HCL 50 MG/ML IJ SOLN
25.0000 mg | Freq: Once | INTRAMUSCULAR | Status: AC
  Administered 2014-05-09: 25 mg via INTRAVENOUS
  Filled 2014-05-09: qty 1

## 2014-05-09 NOTE — ED Notes (Signed)
Pt reports migraine with blurred vision x 2 days that is progressively worsening. Hx of migraines, states this feels like normal. PT reports sensitivity to light. Neuro intact. AO x4.

## 2014-05-09 NOTE — ED Notes (Signed)
PA at BS.  

## 2014-05-09 NOTE — ED Provider Notes (Signed)
CSN: [id number]     Arrival date & time 05/09/14  1813 History   First MD Initiated Contact with Patient 05/09/14 2214     Chief Complaint  Patient presents with  . Migraine     (Consider location/radiation/quality/duration/timing/severity/associated sxs/prior Treatment) HPI Comments: Patient is a 38 year old female past no history of migraines, seizures, anxiety presents emergency room chief complaint of headache since yesterday. The patient reports a gradual onset of  L parietal and radiating to the L neck., constant. Pt reports associated paresthesias of the arms bilaterally, blurry vision.  Pt also endorses nausea without emesis. Movement aggravates symptoms no relieving factors.  PT reports taking goody's powder and tylenol without full resolution of symptoms.  Reports decrease in appetite, reports not eating anything for the past week.  Last seizure early July.  The history is provided by the patient. No language interpreter was used.    Past Medical History  Diagnosis Date  . Migraine   . Seizures   . Anxiety   . Anxiety   . DDD (degenerative disc disease)    Past Surgical History  Procedure Laterality Date  . Cesarean section    . Cholecystectomy    . Tubal ligation    . Endometrial ablation     No family history on file. History  Substance Use Topics  . Smoking status: Current Every Day Smoker -- 1.50 packs/day    Types: Cigarettes  . Smokeless tobacco: Not on file  . Alcohol Use: No   OB History   Grav Para Term Preterm Abortions TAB SAB Ect Mult Living                 Review of Systems  Constitutional: Positive for appetite change. Negative for fever and chills.  Gastrointestinal: Positive for nausea. Negative for vomiting, abdominal pain, diarrhea and constipation.  Neurological: Positive for headaches. Negative for syncope, facial asymmetry and light-headedness.      Allergies  Demerol; Ketorolac; Sumatriptan; and Ibuprofen  Home Medications    Prior to Admission medications   Medication Sig Start Date End Date Taking? Authorizing Provider  albuterol (PROVENTIL HFA;VENTOLIN HFA) 108 (90 BASE) MCG/ACT inhaler Inhale 2 puffs into the lungs every 6 (six) hours as needed for wheezing or shortness of breath.    Yes Historical Provider, MD  clonazePAM (KLONOPIN) 0.5 MG tablet Take 0.5-1 mg by mouth See admin instructions. Takes 1 tablet three times daily and 2 tablets at bedtime   Yes Historical Provider, MD  gabapentin (NEURONTIN) 600 MG tablet Take 600 mg by mouth 4 (four) times daily - after meals and at bedtime.    Yes Historical Provider, MD  levETIRAcetam (KEPPRA) 1000 MG tablet Take 1,000 mg by mouth 2 (two) times daily.    Yes Historical Provider, MD  mirtazapine (REMERON) 45 MG tablet Take 45 mg by mouth at bedtime.   Yes Historical Provider, MD  zonisamide (ZONEGRAN) 100 MG capsule Take 100 mg by mouth at bedtime.   Yes Historical Provider, MD   BP 108/61  Pulse 96  Temp(Src) 99.2 F (37.3 C) (Oral)  SpO2 99%  LMP 05/06/2014 Physical Exam  Nursing note and vitals reviewed. Constitutional: She is oriented to person, place, and time. She appears well-developed and well-nourished. No distress.  HENT:  Head: Normocephalic and atraumatic.  Mouth/Throat: Oropharynx is clear and moist.  Eyes: Conjunctivae and EOM are normal. Pupils are equal, round, and reactive to light.  Neck: Normal range of motion. Neck supple.  Cardiovascular: Normal rate and  regular rhythm.   Pulmonary/Chest: Effort normal and breath sounds normal. No respiratory distress. She has no wheezes. She has no rales.  Abdominal: Soft. There is no tenderness. There is no rebound and no guarding.  Neurological: She is alert and oriented to person, place, and time. No cranial nerve deficit or sensory deficit. She exhibits normal muscle tone. GCS eye subscore is 4. GCS verbal subscore is 5. GCS motor subscore is 6.  Speech is clear and goal oriented, follows  commands Cranial nerves III - XII grossly intact, no facial droop Normal strength in upper and lower extremities bilaterally, strong and equal grip strength Sensation normal to light touch Moves all 4 extremities without ataxia, coordination intact Normal finger to nose and rapid alternating movements No pronator drift  Skin: Skin is warm and dry. She is not diaphoretic.  Psychiatric: She has a normal mood and affect. Her behavior is normal.    ED Course  Procedures (including critical care time) Labs Review Labs Reviewed  BASIC METABOLIC PANEL - Abnormal; Notable for the following:    Potassium 3.6 (*)    All other components within normal limits  URINALYSIS, ROUTINE W REFLEX MICROSCOPIC - Abnormal; Notable for the following:    Color, Urine AMBER (*)    Bilirubin Urine SMALL (*)    All other components within normal limits  CBC WITH DIFFERENTIAL  POC URINE PREG, ED    Imaging Review No results found.   EKG Interpretation None      MDM   Final diagnoses:  Left-sided headache   Patient presents with left-sided headache similar to previous. No neurologic deficits on exam. Will treat with migraine cocktail and reevaluate. Labs without concerning abnormalities urine pregnancy no signs of urinary tract infection. Patient requesting discharge.  Meds given in ED:  Medications  sodium chloride 0.9 % bolus 1,000 mL (0 mLs Intravenous Stopped 05/10/14 0022)  metoCLOPramide (REGLAN) injection 10 mg (10 mg Intravenous Given 05/09/14 2258)  diphenhydrAMINE (BENADRYL) injection 25 mg (25 mg Intravenous Given 05/09/14 2300)  dexamethasone (DECADRON) injection 10 mg (10 mg Intravenous Given 05/09/14 2300)  HYDROmorphone (DILAUDID) injection 1 mg (1 mg Intravenous Given 05/10/14 0021)    New Prescriptions   No medications on file        Harvie Heck, PA-C 05/10/14 0053

## 2014-05-10 LAB — URINALYSIS, ROUTINE W REFLEX MICROSCOPIC
Glucose, UA: NEGATIVE mg/dL
Hgb urine dipstick: NEGATIVE
KETONES UR: NEGATIVE mg/dL
LEUKOCYTES UA: NEGATIVE
NITRITE: NEGATIVE
Protein, ur: NEGATIVE mg/dL
Specific Gravity, Urine: 1.02 (ref 1.005–1.030)
Urobilinogen, UA: 1 mg/dL (ref 0.0–1.0)
pH: 6 (ref 5.0–8.0)

## 2014-05-10 MED ORDER — HYDROMORPHONE HCL 1 MG/ML IJ SOLN
1.0000 mg | Freq: Once | INTRAMUSCULAR | Status: AC
  Administered 2014-05-10: 1 mg via INTRAVENOUS
  Filled 2014-05-10: qty 1

## 2014-05-10 NOTE — Discharge Instructions (Signed)
Call for a follow up appointment with a Family or Primary Care Provider.  °Return if Symptoms worsen.   °Take medication as prescribed.  ° °

## 2014-05-10 NOTE — ED Provider Notes (Signed)
Medical screening examination/treatment/procedure(s) were performed by non-physician practitioner and as supervising physician I was immediately available for consultation/collaboration.   EKG Interpretation None        Ezequiel Essex, MD 05/10/14 [phone]

## 2014-06-08 ENCOUNTER — Encounter (HOSPITAL_COMMUNITY): Payer: Self-pay | Admitting: Emergency Medicine

## 2014-06-08 ENCOUNTER — Emergency Department (HOSPITAL_COMMUNITY)
Admission: EM | Admit: 2014-06-08 | Discharge: 2014-06-09 | Disposition: A | Discharge status: Home / Self Care | Payer: Medicare Other | Attending: Emergency Medicine | Admitting: Emergency Medicine

## 2014-06-08 ENCOUNTER — Emergency Department (HOSPITAL_COMMUNITY): Payer: Medicare Other

## 2014-06-08 DIAGNOSIS — Z9089 Acquired absence of other organs: Secondary | ICD-10-CM | POA: Diagnosis not present

## 2014-06-08 DIAGNOSIS — G43909 Migraine, unspecified, not intractable, without status migrainosus: Secondary | ICD-10-CM | POA: Diagnosis not present

## 2014-06-08 DIAGNOSIS — G40909 Epilepsy, unspecified, not intractable, without status epilepticus: Secondary | ICD-10-CM | POA: Insufficient documentation

## 2014-06-08 DIAGNOSIS — R112 Nausea with vomiting, unspecified: Secondary | ICD-10-CM | POA: Insufficient documentation

## 2014-06-08 DIAGNOSIS — R197 Diarrhea, unspecified: Secondary | ICD-10-CM | POA: Diagnosis not present

## 2014-06-08 DIAGNOSIS — Z720 Tobacco use: Secondary | ICD-10-CM | POA: Diagnosis not present

## 2014-06-08 DIAGNOSIS — N83202 Unspecified ovarian cyst, left side: Secondary | ICD-10-CM

## 2014-06-08 DIAGNOSIS — N83201 Unspecified ovarian cyst, right side: Secondary | ICD-10-CM

## 2014-06-08 DIAGNOSIS — N8320 Unspecified ovarian cysts: Secondary | ICD-10-CM | POA: Insufficient documentation

## 2014-06-08 DIAGNOSIS — R000 Tachycardia, unspecified: Secondary | ICD-10-CM | POA: Diagnosis not present

## 2014-06-08 DIAGNOSIS — Z9851 Tubal ligation status: Secondary | ICD-10-CM | POA: Diagnosis not present

## 2014-06-08 DIAGNOSIS — Z8739 Personal history of other diseases of the musculoskeletal system and connective tissue: Secondary | ICD-10-CM | POA: Insufficient documentation

## 2014-06-08 DIAGNOSIS — R1031 Right lower quadrant pain: Secondary | ICD-10-CM | POA: Diagnosis present

## 2014-06-08 DIAGNOSIS — Z9889 Other specified postprocedural states: Secondary | ICD-10-CM | POA: Insufficient documentation

## 2014-06-08 DIAGNOSIS — F419 Anxiety disorder, unspecified: Secondary | ICD-10-CM | POA: Diagnosis not present

## 2014-06-08 DIAGNOSIS — Z79899 Other long term (current) drug therapy: Secondary | ICD-10-CM | POA: Diagnosis not present

## 2014-06-08 DIAGNOSIS — Z3202 Encounter for pregnancy test, result negative: Secondary | ICD-10-CM | POA: Diagnosis not present

## 2014-06-08 LAB — CBC WITH DIFFERENTIAL/PLATELET
BASOS ABS: 0 10*3/uL (ref 0.0–0.1)
BASOS PCT: 0 % (ref 0–1)
Eosinophils Absolute: 0.1 10*3/uL (ref 0.0–0.7)
Eosinophils Relative: 1 % (ref 0–5)
HEMATOCRIT: 45.4 % (ref 36.0–46.0)
Hemoglobin: 14.7 g/dL (ref 12.0–15.0)
Lymphocytes Relative: 28 % (ref 12–46)
Lymphs Abs: 2.8 10*3/uL (ref 0.7–4.0)
MCH: 30.8 pg (ref 26.0–34.0)
MCHC: 32.4 g/dL (ref 30.0–36.0)
MCV: 95.2 fL (ref 78.0–100.0)
Monocytes Absolute: 0.4 10*3/uL (ref 0.1–1.0)
Monocytes Relative: 4 % (ref 3–12)
NEUTROS ABS: 7 10*3/uL (ref 1.7–7.7)
NEUTROS PCT: 67 % (ref 43–77)
Platelets: 318 10*3/uL (ref 150–400)
RBC: 4.77 MIL/uL (ref 3.87–5.11)
RDW: 13 % (ref 11.5–15.5)
WBC: 10.3 10*3/uL (ref 4.0–10.5)

## 2014-06-08 LAB — URINE MICROSCOPIC-ADD ON

## 2014-06-08 LAB — COMPREHENSIVE METABOLIC PANEL
ALBUMIN: 3.6 g/dL (ref 3.5–5.2)
ALT: 15 U/L (ref 0–35)
AST: 14 U/L (ref 0–37)
Alkaline Phosphatase: 84 U/L (ref 39–117)
Anion gap: 15 (ref 5–15)
BILIRUBIN TOTAL: 0.3 mg/dL (ref 0.3–1.2)
BUN: 6 mg/dL (ref 6–23)
CHLORIDE: 101 meq/L (ref 96–112)
CO2: 23 mEq/L (ref 19–32)
CREATININE: 0.77 mg/dL (ref 0.50–1.10)
Calcium: 9.2 mg/dL (ref 8.4–10.5)
GFR calc Af Amer: >90 mL/min (ref >90)
GFR calc non Af Amer: >90 mL/min (ref >90)
Glucose, Bld: 138 mg/dL — ABNORMAL HIGH (ref 70–99)
Potassium: 3.5 mEq/L — ABNORMAL LOW (ref 3.7–5.3)
Sodium: 139 mEq/L (ref 137–147)
TOTAL PROTEIN: 7.1 g/dL (ref 6.0–8.3)

## 2014-06-08 LAB — URINALYSIS, ROUTINE W REFLEX MICROSCOPIC
Bilirubin Urine: NEGATIVE
Glucose, UA: NEGATIVE mg/dL
Ketones, ur: 15 mg/dL — AB
NITRITE: NEGATIVE
Protein, ur: NEGATIVE mg/dL
Specific Gravity, Urine: 1.008 (ref 1.005–1.030)
UROBILINOGEN UA: 1 mg/dL (ref 0.0–1.0)
pH: 7.5 (ref 5.0–8.0)

## 2014-06-08 LAB — POC URINE PREG, ED: PREG TEST UR: NEGATIVE

## 2014-06-08 LAB — I-STAT CG4 LACTIC ACID, ED: Lactic Acid, Venous: 0.96 mmol/L (ref 0.5–2.2)

## 2014-06-08 LAB — LIPASE, BLOOD: LIPASE: 13 U/L (ref 11–59)

## 2014-06-08 MED ORDER — ONDANSETRON HCL 4 MG/2ML IJ SOLN
4.0000 mg | Freq: Once | INTRAMUSCULAR | Status: AC
  Administered 2014-06-08: 4 mg via INTRAVENOUS
  Filled 2014-06-08: qty 2

## 2014-06-08 MED ORDER — MORPHINE SULFATE 4 MG/ML IJ SOLN
6.0000 mg | Freq: Once | INTRAMUSCULAR | Status: AC
  Administered 2014-06-08: 6 mg via INTRAVENOUS
  Filled 2014-06-08: qty 2

## 2014-06-08 MED ORDER — SODIUM CHLORIDE 0.9 % IV BOLUS (SEPSIS)
1000.0000 mL | Freq: Once | INTRAVENOUS | Status: AC
  Administered 2014-06-08: 1000 mL via INTRAVENOUS

## 2014-06-08 MED ORDER — IOHEXOL 300 MG/ML  SOLN
100.0000 mL | Freq: Once | INTRAMUSCULAR | Status: AC | PRN
  Administered 2014-06-08: 100 mL via INTRAVENOUS

## 2014-06-08 MED ORDER — HYDROMORPHONE HCL 1 MG/ML IJ SOLN
1.0000 mg | Freq: Once | INTRAMUSCULAR | Status: AC
  Administered 2014-06-08: 1 mg via INTRAVENOUS
  Filled 2014-06-08: qty 1

## 2014-06-08 NOTE — ED Provider Notes (Signed)
CSN: [id number]     Arrival date & time 06/08/14  1409 History   First MD Initiated Contact with Patient 06/08/14 1640     Chief Complaint  Patient presents with  . Abdominal Pain     (Consider location/radiation/quality/duration/timing/severity/associated sxs/prior Treatment) HPI Comments: 38 year old female with gradually worsening right lower corner and abdominal pain for 1 week with nausea vomiting and diarrhea. Patient says pain is deep ache, no focal radiation. Denies urinary symptoms. No history of similar. History of kidney stones or appendix. Patient had gallbladder removed in the past.  The history is provided by the patient.    Past Medical History  Diagnosis Date  . Migraine   . Seizures   . Anxiety   . Anxiety   . DDD (degenerative disc disease)    Past Surgical History  Procedure Laterality Date  . Cesarean section    . Cholecystectomy    . Tubal ligation    . Endometrial ablation     No family history on file. History  Substance Use Topics  . Smoking status: Current Every Day Smoker -- 1.50 packs/day    Types: Cigarettes  . Smokeless tobacco: Not on file  . Alcohol Use: No   OB History   Grav Para Term Preterm Abortions TAB SAB Ect Mult Living                 Review of Systems  Constitutional: Positive for appetite change. Negative for fever and chills.  HENT: Negative for congestion.   Eyes: Negative for visual disturbance.  Respiratory: Negative for shortness of breath.   Cardiovascular: Negative for chest pain.  Gastrointestinal: Positive for vomiting, abdominal pain and diarrhea.  Genitourinary: Negative for dysuria and flank pain.  Musculoskeletal: Negative for back pain, neck pain and neck stiffness.  Skin: Negative for rash.  Neurological: Negative for light-headedness and headaches.      Allergies  Demerol; Ketorolac; Sumatriptan; and Ibuprofen  Home Medications   Prior to Admission medications   Medication Sig Start Date End  Date Taking? Authorizing Provider  albuterol (PROVENTIL HFA;VENTOLIN HFA) 108 (90 BASE) MCG/ACT inhaler Inhale 2 puffs into the lungs every 6 (six) hours as needed for wheezing or shortness of breath.    Yes Historical Provider, MD  clonazePAM (KLONOPIN) 0.5 MG tablet Take 0.5-1 mg by mouth See admin instructions. Takes 1 tablet three times daily and 2 tablets at bedtime   Yes Historical Provider, MD  gabapentin (NEURONTIN) 600 MG tablet Take 600 mg by mouth 4 (four) times daily - after meals and at bedtime.    Yes Historical Provider, MD  levETIRAcetam (KEPPRA) 1000 MG tablet Take 1,000 mg by mouth 2 (two) times daily.    Yes Historical Provider, MD  mirtazapine (REMERON) 45 MG tablet Take 45 mg by mouth at bedtime.   Yes Historical Provider, MD  zolpidem (AMBIEN) 5 MG tablet Take 5 mg by mouth at bedtime as needed for sleep.   Yes Historical Provider, MD  zonisamide (ZONEGRAN) 100 MG capsule Take 100 mg by mouth at bedtime.   Yes Historical Provider, MD   BP 95/60  Pulse 85  Temp(Src) 97.4 F (36.3 C)  Resp 18  SpO2 99%  LMP 05/28/2014 Physical Exam  Nursing note and vitals reviewed. Constitutional: She is oriented to person, place, and time. She appears well-developed and well-nourished.  HENT:  Head: Normocephalic and atraumatic.  Mild dry mucous membranes  Eyes: Conjunctivae are normal. Right eye exhibits no discharge. Left eye exhibits no  discharge.  Neck: Normal range of motion. Neck supple. No tracheal deviation present.  Cardiovascular: Regular rhythm.  Tachycardia present.   Pulmonary/Chest: Effort normal and breath sounds normal.  Abdominal: Soft. She exhibits no distension. There is tenderness (right lower quadrant). There is no guarding.  Musculoskeletal: She exhibits no edema.  Neurological: She is alert and oriented to person, place, and time.  Skin: Skin is warm. No rash noted.  Psychiatric: She has a normal mood and affect.    ED Course  Procedures (including  critical care time) Labs Review Labs Reviewed  COMPREHENSIVE METABOLIC PANEL - Abnormal; Notable for the following:    Potassium 3.5 (*)    Glucose, Bld 138 (*)    All other components within normal limits  URINALYSIS, ROUTINE W REFLEX MICROSCOPIC - Abnormal; Notable for the following:    APPearance CLOUDY (*)    Hgb urine dipstick MODERATE (*)    Ketones, ur 15 (*)    Leukocytes, UA SMALL (*)    All other components within normal limits  URINE MICROSCOPIC-ADD ON - Abnormal; Notable for the following:    Squamous Epithelial / LPF FEW (*)    Casts HYALINE CASTS (*)    All other components within normal limits  CBC WITH DIFFERENTIAL  LIPASE, BLOOD  POC URINE PREG, ED  I-STAT CG4 LACTIC ACID, ED    Imaging Review US Transvaginal Non-ob  06/08/2014   ADDENDUM REPORT: 06/08/2014 21:42  ADDENDUM: Blood flow is demonstrated within both ovaries with color Doppler. Duplex Doppler was not requested or performed.   Electronically Signed   By: Enrique Sack M.D.   On: 06/08/2014 21:42   06/08/2014   CLINICAL DATA:  Right lower quadrant abdominal pain for the past week. Nausea, vomiting and diarrhea. Left ovarian follicles at CT earlier today. Negative pregnancy test.  EXAM: TRANSABDOMINAL ULTRASOUND OF PELVIS  TECHNIQUE: Transabdominal ultrasound examination of the pelvis was performed including evaluation of the uterus, ovaries, adnexal regions, and pelvic cul-de-sac.  COMPARISON:  Pelvis CT obtained earlier today.  FINDINGS: Uterus  Measurements: 8.9 x 4.5 x 4.4 cm. No fibroids or other mass visualized.  Endometrium  Thickness: 2.5 mm.  No focal abnormality visualized.  Right ovary  Measurements: 2.4 x 2.4 x 2.1 cm. Normal appearance/no adnexal mass.  Left ovary  Measurements: 3.3 x 1.9 x 1.5 cm. Previously noted follicles.  Other findings: Trace amount of free peritoneal fluid, within normal limits of physiological fluid.  IMPRESSION: Normal examination.  Electronically Signed: By: Enrique Sack M.D.  On: 06/08/2014 20:26   US Pelvis Complete  06/08/2014   ADDENDUM REPORT: 06/08/2014 21:42  ADDENDUM: Blood flow is demonstrated within both ovaries with color Doppler. Duplex Doppler was not requested or performed.   Electronically Signed   By: Enrique Sack M.D.   On: 06/08/2014 21:42   06/08/2014   CLINICAL DATA:  Right lower quadrant abdominal pain for the past week. Nausea, vomiting and diarrhea. Left ovarian follicles at CT earlier today. Negative pregnancy test.  EXAM: TRANSABDOMINAL ULTRASOUND OF PELVIS  TECHNIQUE: Transabdominal ultrasound examination of the pelvis was performed including evaluation of the uterus, ovaries, adnexal regions, and pelvic cul-de-sac.  COMPARISON:  Pelvis CT obtained earlier today.  FINDINGS: Uterus  Measurements: 8.9 x 4.5 x 4.4 cm. No fibroids or other mass visualized.  Endometrium  Thickness: 2.5 mm.  No focal abnormality visualized.  Right ovary  Measurements: 2.4 x 2.4 x 2.1 cm. Normal appearance/no adnexal mass.  Left ovary  Measurements: 3.3 x  1.9 x 1.5 cm. Previously noted follicles.  Other findings: Trace amount of free peritoneal fluid, within normal limits of physiological fluid.  IMPRESSION: Normal examination.  Electronically Signed: By: Enrique Sack M.D. On: 06/08/2014 20:26   Ct Abdomen Pelvis W Contrast  06/08/2014   CLINICAL DATA:  38 year old female with right lower quadrant pain for the past week and a half. Initial encounter.  EXAM: CT ABDOMEN AND PELVIS WITH CONTRAST  TECHNIQUE: Multidetector CT imaging of the abdomen and pelvis was performed using the standard protocol following bolus administration of intravenous contrast.  CONTRAST:  153mL OMNIPAQUE IOHEXOL 300 MG/ML  SOLN  COMPARISON:  02/06/2014.  FINDINGS: Basilar subsegmental atelectasis/ scarring.  Portions of bowel are under distended evaluation slightly limited. No extra luminal bowel inflammatory process or free intraperitoneal air. The tiny amount of fluid in the pelvis may be related to  recent ovulation. No inflammation noted surrounding the appendix or terminal ileum.  Bilateral ovarian cyst/follicles largest on the left measuring up to 2.3 cm.  Post cholecystectomy.  No worrisome hepatic, splenic, pancreatic, adrenal or right renal lesion. 1.2 cm left renal cyst incidentally noted.  No abdominal aortic aneurysm. Mild atherosclerotic type changes of the lower abdominal aorta with small noncalcified plaque.  No osseous destructive lesion.  Noncontrast filled imaging of the urinary bladder without abnormality.  Scattered small lymph nodes without adenopathy.  No soft tissue abnormality noted.  IMPRESSION: Portions of bowel are under distended and evaluation slightly limited. No extra luminal bowel inflammatory process or free intraperitoneal air. The tiny amount of fluid in the pelvis may be related to recent ovulation. No inflammation noted surrounding the appendix or terminal ileum.  Bilateral ovarian cyst/follicles largest on the left measuring up to 2.3 cm.   Electronically Signed   By: Chauncey Cruel M.D.   On: 06/08/2014 18:59     EKG Interpretation None      MDM   Final diagnoses:  Abdominal pain, right lower quadrant  Non-intractable vomiting with nausea, vomiting of unspecified type  Cysts of both ovaries   Patient with worsening right lower corner pain discussed differential appendicitis versus urine/kidney infection versus kidney stone. Patient dehydrated clinically and tachycardic, IV fluid bolus and pain meds given. CT abdomen for further delineation of pain.  On recheck patient feels abdominal pain is worse, repeat pain meds given. CT scan of abdomen results reviewed no acute findings to explain pain, no inflammation around the appendix or ileum, bilateral ovarian cysts.  Due to persistent pain formal ultrasound pelvis ordered, no acute findings. Discussed on the phone the results with Dr. Joneen Caraway, patient has no swelling of the ovaries, good blood flow.  Patient's pain  improved on recheck however with persistent pain discussed with general surgery Dr. Grandville Silos did not feel there is no acute abdominal issue or appendicitis at this time.  Patient's presentation is very atypical, no fever, no white count.    Followup outpatient closely discussed.  Results and differential diagnosis were discussed with the patient/parent/guardian. Close follow up outpatient was discussed, comfortable with the plan.   Medications  ondansetron (ZOFRAN-ODT) disintegrating tablet 4 mg (not administered)  morphine 4 MG/ML injection 6 mg (6 mg Intravenous Given 06/08/14 1714)  sodium chloride 0.9 % bolus 1,000 mL (0 mLs Intravenous Stopped 06/08/14 1841)  ondansetron (ZOFRAN) injection 4 mg (4 mg Intravenous Given 06/08/14 1713)  HYDROmorphone (DILAUDID) injection 1 mg (1 mg Intravenous Given 06/08/14 1759)  iohexol (OMNIPAQUE) 300 MG/ML solution 100 mL (100 mLs Intravenous Contrast Given  06/08/14 1814)  ondansetron (ZOFRAN) injection 4 mg (4 mg Intravenous Given 06/08/14 2143)  HYDROmorphone (DILAUDID) injection 1 mg (1 mg Intravenous Given 06/08/14 2143)  sodium chloride 0.9 % bolus 1,000 mL (1,000 mLs Intravenous New Bag/Given 06/08/14 2328)    Filed Vitals:   06/08/14 2315 06/08/14 2345 06/09/14 0010 06/09/14 0015  BP: 99/54 100/66 95/64 92/59   Pulse: 63 55 61 51  Temp:      Resp: 19 16 16 14   SpO2: 95% 99% 100% 91%    Final diagnoses:  Abdominal pain, right lower quadrant  Non-intractable vomiting with nausea, vomiting of unspecified type  Cysts of both ovaries      Mariea Clonts, MD 06/09/14 [phone]

## 2014-06-08 NOTE — ED Notes (Signed)
Pt reports RLQ abdominal pain X 1 weeks with N/V/D. Pt denies lower back pain or painful urination. Pt states pain is 10/10.

## 2014-06-08 NOTE — ED Notes (Signed)
Called [place] to clarify results for blood flow to ovaries per Dr. Reather Converse. Left a message.

## 2014-06-08 NOTE — ED Notes (Signed)
Pt requesting female RN to answer questions regarding anal sex with her husband, wondering if sex had caused pain that she is experiencing today. Answered pt's questions and educated patient on safe sex and ways to reduce pain during anal sex. Pt verbalizes understanding.

## 2014-06-08 NOTE — ED Notes (Signed)
Patient transported to CT 

## 2014-06-09 ENCOUNTER — Telehealth (HOSPITAL_BASED_OUTPATIENT_CLINIC_OR_DEPARTMENT_OTHER): Payer: Self-pay

## 2014-06-09 MED ORDER — ONDANSETRON 4 MG PO TBDP
4.0000 mg | ORAL_TABLET | Freq: Once | ORAL | Status: AC
  Administered 2014-06-09: 4 mg via ORAL
  Filled 2014-06-09: qty 1

## 2014-06-09 MED ORDER — ONDANSETRON 4 MG PO TBDP
ORAL_TABLET | ORAL | Status: DC
Start: 2014-06-09 — End: 2020-07-04

## 2014-06-09 MED ORDER — HYDROCODONE-ACETAMINOPHEN 5-325 MG PO TABS
1.0000 | ORAL_TABLET | ORAL | Status: DC | PRN
Start: 2014-06-09 — End: 2020-07-04

## 2014-06-09 NOTE — Telephone Encounter (Signed)
Pt calling stated she rcvd call from [phone] w/no message left.  This RN could not find any reason in chart other than possibly a f/u call for her visit.  Pt informed.

## 2014-06-09 NOTE — Consult Note (Signed)
Reason for Consult:Abdominal pain Referring Physician: Murna Backer is an 38 y.o. female.  HPI: Patient complains of a ten-day history of right-sided abdominal pain. One day, it was associated with decreased urination. This resolved. She has also had some diarrhea. She does not notice any exacerbating factors. She has had similar pain in the past.  Past Medical History  Diagnosis Date  . Migraine   . Seizures   . Anxiety   . Anxiety   . DDD (degenerative disc disease)     Past Surgical History  Procedure Laterality Date  . Cesarean section    . Cholecystectomy    . Tubal ligation    . Endometrial ablation      No family history on file.  Social History:  reports that she has been smoking Cigarettes.  She has been smoking about 1.50 packs per day. She does not have any smokeless tobacco history on file. She reports that she does not drink alcohol or use illicit drugs.  Allergies:  Allergies  Allergen Reactions  . Demerol [Meperidine] Other (See Comments)    combative  . Ketorolac Itching  . Sumatriptan Other (See Comments)    Stroke risk  . Ibuprofen Itching and Rash    Medications: Prior to Admission:  (Not in a hospital admission)  Results for orders placed during the hospital encounter of 06/08/14 (from the past 48 hour(s))  CBC WITH DIFFERENTIAL     Status: None   Collection Time    06/08/14  2:18 PM      Result Value Ref Range   WBC 10.3  4.0 - 10.5 K/uL   RBC 4.77  3.87 - 5.11 MIL/uL   Hemoglobin 14.7  12.0 - 15.0 g/dL   HCT 45.4  36.0 - 46.0 %   MCV 95.2  78.0 - 100.0 fL   MCH 30.8  26.0 - 34.0 pg   MCHC 32.4  30.0 - 36.0 g/dL   RDW 13.0  11.5 - 15.5 %   Platelets 318  150 - 400 K/uL   Neutrophils Relative % 67  43 - 77 %   Neutro Abs 7.0  1.7 - 7.7 K/uL   Lymphocytes Relative 28  12 - 46 %   Lymphs Abs 2.8  0.7 - 4.0 K/uL   Monocytes Relative 4  3 - 12 %   Monocytes Absolute 0.4  0.1 - 1.0 K/uL   Eosinophils Relative 1  0 - 5 %    Eosinophils Absolute 0.1  0.0 - 0.7 K/uL   Basophils Relative 0  0 - 1 %   Basophils Absolute 0.0  0.0 - 0.1 K/uL  COMPREHENSIVE METABOLIC PANEL     Status: Abnormal   Collection Time    06/08/14  2:18 PM      Result Value Ref Range   Sodium 139  137 - 147 mEq/L   Potassium 3.5 (*) 3.7 - 5.3 mEq/L   Chloride 101  96 - 112 mEq/L   CO2 23  19 - 32 mEq/L   Glucose, Bld 138 (*) 70 - 99 mg/dL   BUN 6  6 - 23 mg/dL   Creatinine, Ser 0.77  0.50 - 1.10 mg/dL   Calcium 9.2  8.4 - 10.5 mg/dL   Total Protein 7.1  6.0 - 8.3 g/dL   Albumin 3.6  3.5 - 5.2 g/dL   AST 14  0 - 37 U/L   ALT 15  0 - 35 U/L   Alkaline Phosphatase 84  39 - 117 U/L   Total Bilirubin 0.3  0.3 - 1.2 mg/dL   GFR calc non Af Amer >90  >90 mL/min   GFR calc Af Amer >90  >90 mL/min   Comment: (NOTE)     The eGFR has been calculated using the CKD EPI equation.     This calculation has not been validated in all clinical situations.     eGFR's persistently <90 mL/min signify possible Chronic Kidney     Disease.   Anion gap 15  5 - 15  LIPASE, BLOOD     Status: None   Collection Time    06/08/14  2:18 PM      Result Value Ref Range   Lipase 13  11 - 59 U/L  URINALYSIS, ROUTINE W REFLEX MICROSCOPIC     Status: Abnormal   Collection Time    06/08/14  4:51 PM      Result Value Ref Range   Color, Urine YELLOW  YELLOW   APPearance CLOUDY (*) CLEAR   Specific Gravity, Urine 1.008  1.005 - 1.030   pH 7.5  5.0 - 8.0   Glucose, UA NEGATIVE  NEGATIVE mg/dL   Hgb urine dipstick MODERATE (*) NEGATIVE   Bilirubin Urine NEGATIVE  NEGATIVE   Ketones, ur 15 (*) NEGATIVE mg/dL   Protein, ur NEGATIVE  NEGATIVE mg/dL   Urobilinogen, UA 1.0  0.0 - 1.0 mg/dL   Nitrite NEGATIVE  NEGATIVE   Leukocytes, UA SMALL (*) NEGATIVE  URINE MICROSCOPIC-ADD ON     Status: Abnormal   Collection Time    06/08/14  4:51 PM      Result Value Ref Range   Squamous Epithelial / LPF FEW (*) RARE   WBC, UA 3-6  <3 WBC/hpf   RBC / HPF 0-2  <3 RBC/hpf    Bacteria, UA RARE  RARE   Casts HYALINE CASTS (*) NEGATIVE  POC URINE PREG, ED     Status: None   Collection Time    06/08/14  4:59 PM      Result Value Ref Range   Preg Test, Ur NEGATIVE  NEGATIVE   Comment:            THE SENSITIVITY OF THIS     METHODOLOGY IS >24 mIU/mL  I-STAT CG4 LACTIC ACID, ED     Status: None   Collection Time    06/08/14 11:16 PM      Result Value Ref Range   Lactic Acid, Venous 0.96  0.5 - 2.2 mmol/L    US Transvaginal Non-ob  06/08/2014   ADDENDUM REPORT: 06/08/2014 21:42  ADDENDUM: Blood flow is demonstrated within both ovaries with color Doppler. Duplex Doppler was not requested or performed.   Electronically Signed   By: Enrique Sack M.D.   On: 06/08/2014 21:42   06/08/2014   CLINICAL DATA:  Right lower quadrant abdominal pain for the past week. Nausea, vomiting and diarrhea. Left ovarian follicles at CT earlier today. Negative pregnancy test.  EXAM: TRANSABDOMINAL ULTRASOUND OF PELVIS  TECHNIQUE: Transabdominal ultrasound examination of the pelvis was performed including evaluation of the uterus, ovaries, adnexal regions, and pelvic cul-de-sac.  COMPARISON:  Pelvis CT obtained earlier today.  FINDINGS: Uterus  Measurements: 8.9 x 4.5 x 4.4 cm. No fibroids or other mass visualized.  Endometrium  Thickness: 2.5 mm.  No focal abnormality visualized.  Right ovary  Measurements: 2.4 x 2.4 x 2.1 cm. Normal appearance/no adnexal mass.  Left ovary  Measurements: 3.3 x 1.9  x 1.5 cm. Previously noted follicles.  Other findings: Trace amount of free peritoneal fluid, within normal limits of physiological fluid.  IMPRESSION: Normal examination.  Electronically Signed: By: Enrique Sack M.D. On: 06/08/2014 20:26   US Pelvis Complete  06/08/2014   ADDENDUM REPORT: 06/08/2014 21:42  ADDENDUM: Blood flow is demonstrated within both ovaries with color Doppler. Duplex Doppler was not requested or performed.   Electronically Signed   By: Enrique Sack M.D.   On: 06/08/2014 21:42    06/08/2014   CLINICAL DATA:  Right lower quadrant abdominal pain for the past week. Nausea, vomiting and diarrhea. Left ovarian follicles at CT earlier today. Negative pregnancy test.  EXAM: TRANSABDOMINAL ULTRASOUND OF PELVIS  TECHNIQUE: Transabdominal ultrasound examination of the pelvis was performed including evaluation of the uterus, ovaries, adnexal regions, and pelvic cul-de-sac.  COMPARISON:  Pelvis CT obtained earlier today.  FINDINGS: Uterus  Measurements: 8.9 x 4.5 x 4.4 cm. No fibroids or other mass visualized.  Endometrium  Thickness: 2.5 mm.  No focal abnormality visualized.  Right ovary  Measurements: 2.4 x 2.4 x 2.1 cm. Normal appearance/no adnexal mass.  Left ovary  Measurements: 3.3 x 1.9 x 1.5 cm. Previously noted follicles.  Other findings: Trace amount of free peritoneal fluid, within normal limits of physiological fluid.  IMPRESSION: Normal examination.  Electronically Signed: By: Enrique Sack M.D. On: 06/08/2014 20:26   Ct Abdomen Pelvis W Contrast  06/08/2014   CLINICAL DATA:  38 year old female with right lower quadrant pain for the past week and a half. Initial encounter.  EXAM: CT ABDOMEN AND PELVIS WITH CONTRAST  TECHNIQUE: Multidetector CT imaging of the abdomen and pelvis was performed using the standard protocol following bolus administration of intravenous contrast.  CONTRAST:  118m OMNIPAQUE IOHEXOL 300 MG/ML  SOLN  COMPARISON:  02/06/2014.  FINDINGS: Basilar subsegmental atelectasis/ scarring.  Portions of bowel are under distended evaluation slightly limited. No extra luminal bowel inflammatory process or free intraperitoneal air. The tiny amount of fluid in the pelvis may be related to recent ovulation. No inflammation noted surrounding the appendix or terminal ileum.  Bilateral ovarian cyst/follicles largest on the left measuring up to 2.3 cm.  Post cholecystectomy.  No worrisome hepatic, splenic, pancreatic, adrenal or right renal lesion. 1.2 cm left renal cyst  incidentally noted.  No abdominal aortic aneurysm. Mild atherosclerotic type changes of the lower abdominal aorta with small noncalcified plaque.  No osseous destructive lesion.  Noncontrast filled imaging of the urinary bladder without abnormality.  Scattered small lymph nodes without adenopathy.  No soft tissue abnormality noted.  IMPRESSION: Portions of bowel are under distended and evaluation slightly limited. No extra luminal bowel inflammatory process or free intraperitoneal air. The tiny amount of fluid in the pelvis may be related to recent ovulation. No inflammation noted surrounding the appendix or terminal ileum.  Bilateral ovarian cyst/follicles largest on the left measuring up to 2.3 cm.   Electronically Signed   By: SChauncey CruelM.D.   On: 06/08/2014 18:59    Review of Systems  Constitutional: Negative for fever and chills.  HENT: Negative.   Eyes: Negative.   Respiratory: Negative.   Cardiovascular: Negative.   Gastrointestinal: Positive for abdominal pain and diarrhea.  Genitourinary: Negative.   Musculoskeletal: Negative.   Skin: Negative.   Neurological: Negative.   Endo/Heme/Allergies: Negative.   Psychiatric/Behavioral: Negative.    Blood pressure 100/66, pulse 55, temperature 97.4 F (36.3 C), resp. rate 16, last menstrual period 05/28/2014, SpO2 99.00%. Physical Exam  Constitutional: She is oriented to person, place, and time. She appears well-developed and well-nourished. No distress.  HENT:  Head: Normocephalic and atraumatic.  Nose: Nose normal.  Mouth/Throat: Oropharynx is clear and moist. No oropharyngeal exudate.  Eyes: EOM are normal. Pupils are equal, round, and reactive to light. Right eye exhibits no discharge. No scleral icterus.  Neck: Normal range of motion. No tracheal deviation present.  Cardiovascular: Normal rate, normal heart sounds and intact distal pulses.   Respiratory: Effort normal and breath sounds normal. No stridor. No respiratory distress.  She has no wheezes. She has no rales.  GI: Soft. Bowel sounds are normal. She exhibits no distension. There is tenderness. There is no rebound and no guarding.  Intermittent tenderness right lower quadrant without guarding, no generalized tenderness, no peritonitis  Musculoskeletal: Normal range of motion.  Neurological: She is alert and oriented to person, place, and time. She exhibits normal muscle tone.  Skin: Skin is warm.  Psychiatric: She has a normal mood and affect.    Assessment/Plan: Right-sided abdominal pain - no acute surgical issue identified. CT scan appears normal. Of note, this is her fourth CT scan of the abdomen and pelvis in the past 12 months. No reason for surgery nor admission identified from our standpoint. She may benefit from an outpatient GI evaluation.  Brianna Merritt E 06/09/2014, 12:06 AM

## 2014-06-09 NOTE — Discharge Instructions (Signed)
If you were given medicines take as directed.  If you are on coumadin or contraceptives realize their levels and effectiveness is altered by many different medicines.  If you have any reaction (rash, tongues swelling, other) to the medicines stop taking and see a physician.   Please follow up as directed and return to the ER or see a physician for new or worsening symptoms.  Thank you. Filed Vitals:   06/08/14 2315 06/08/14 2345 06/09/14 0010 06/09/14 0015  BP: 99/54 100/66 95/64 92/59   Pulse: 63 55 61 51  Temp:      Resp: 19 16 16 14   SpO2: 95% 99% 100% 91%

## 2015-04-02 IMAGING — CT CT ABD-PELV W/ CM
2 of 4 series · 12 of 46 positions shown, 14 images · IV contrast (Iodine)
Comparison: 02/06/2014.

CLINICAL DATA: 37-year-old female with right lower quadrant pain
for the past week and a half. Initial encounter.

EXAM:
CT ABDOMEN AND PELVIS WITH CONTRAST
TECHNIQUE: Multidetector CT imaging of the abdomen and pelvis was performed
using the standard protocol following bolus administration of
intravenous contrast.
CONTRAST:  100mL OMNIPAQUE IOHEXOL 300 MG/ML  SOLN

[Series 201: routine, idose (2) · axial · 0.66mm/px · z∈[+85,+465]mm · 9 of 92 slices shown, 11 images]
[im 8/92  soft-tissue]
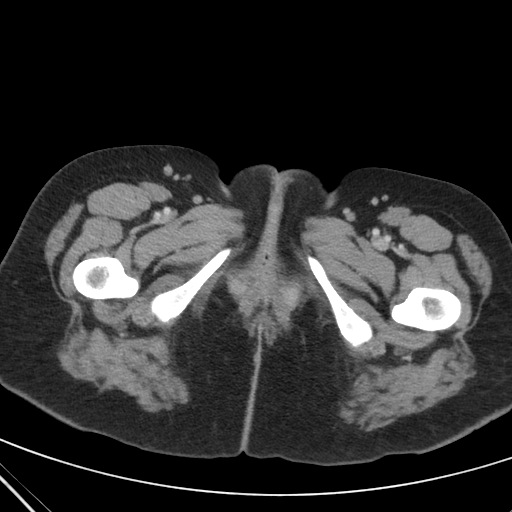
[im 8/92  bone]
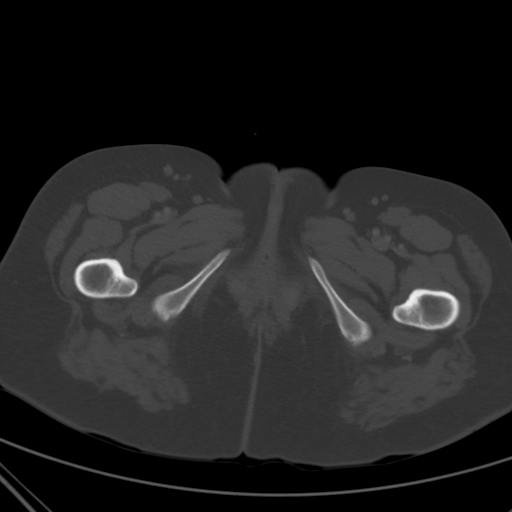
[im 16/92  soft-tissue]
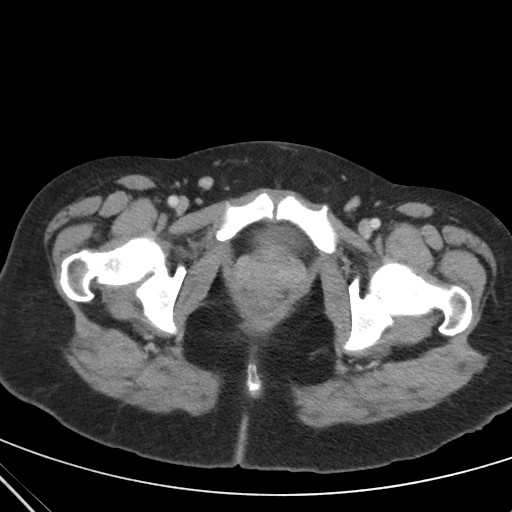
[im 28/92  soft-tissue]
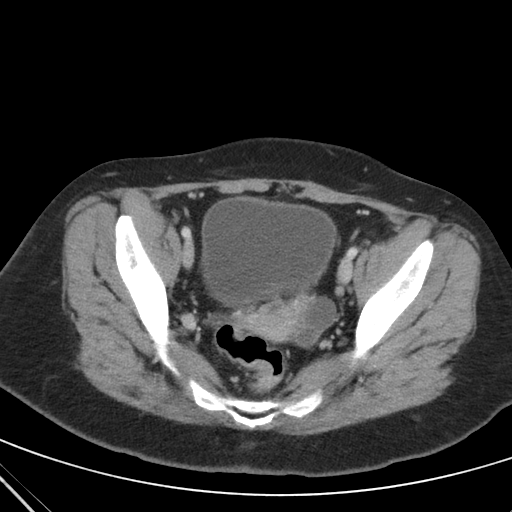
[im 36/92  soft-tissue]
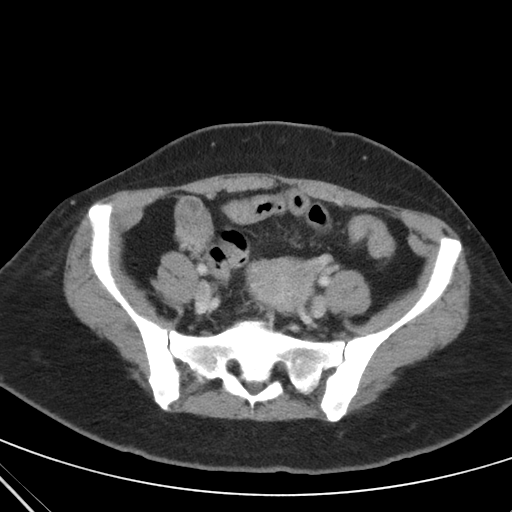
[im 48/92  soft-tissue]
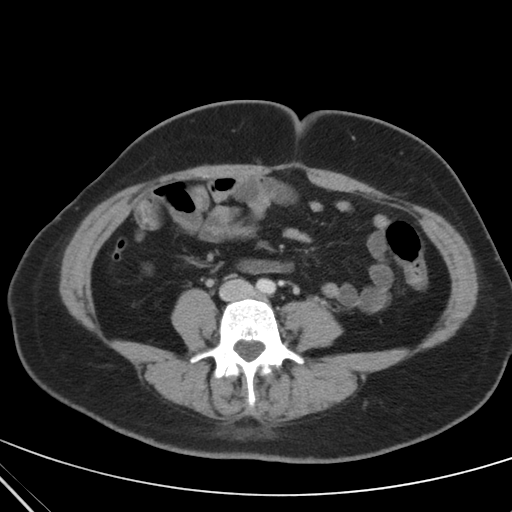
[im 56/92  soft-tissue]
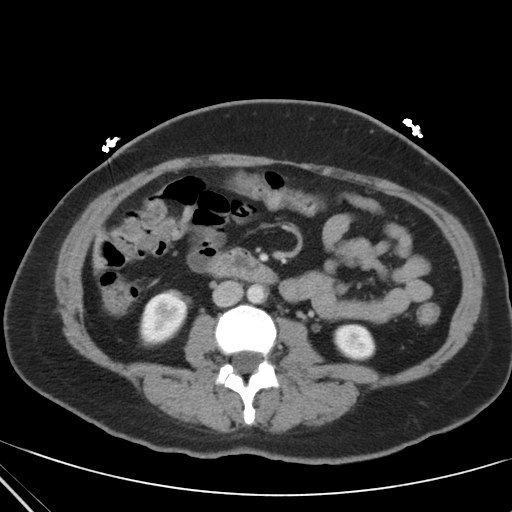
[im 64/92  soft-tissue]
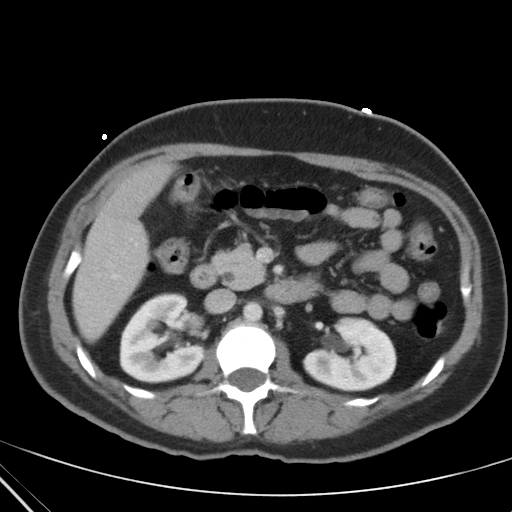
[im 76/92  soft-tissue]
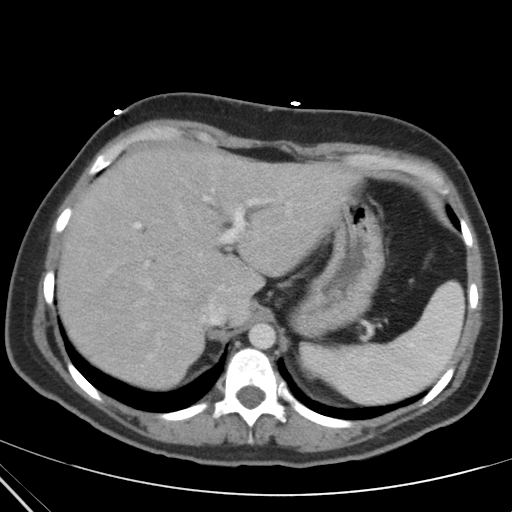
[im 84/92  soft-tissue]
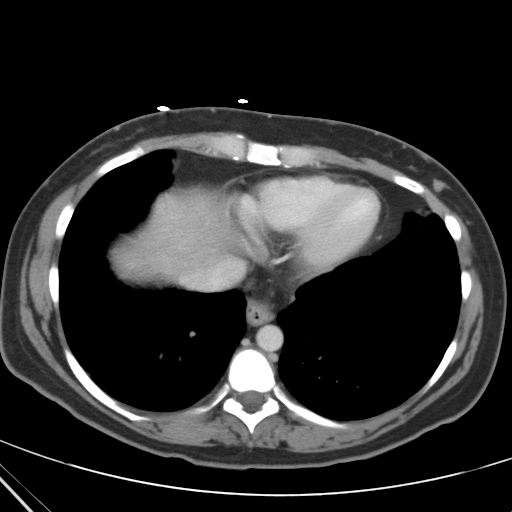
[im 84/92  bone]
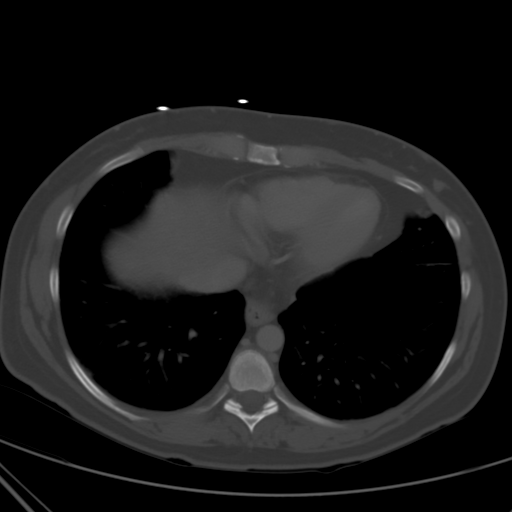

[Series 202: coronals, idose (2) · coronal · 0.45mm/px · 3 of 94 slices shown]
[im 32/94  soft-tissue]
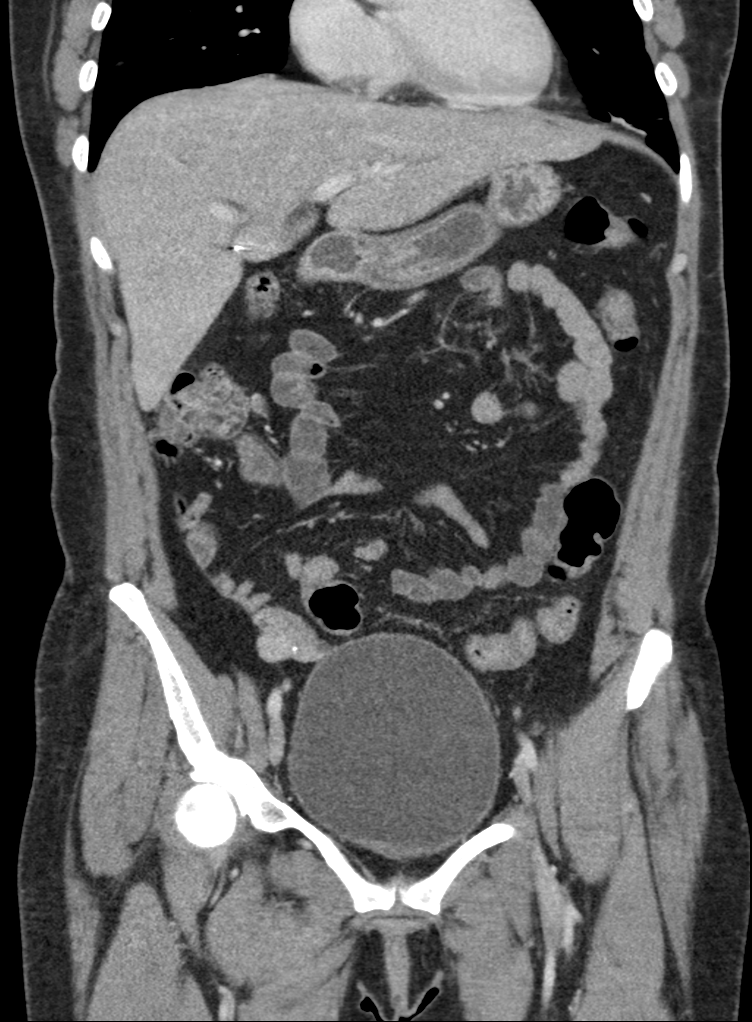
[im 42/94  soft-tissue]
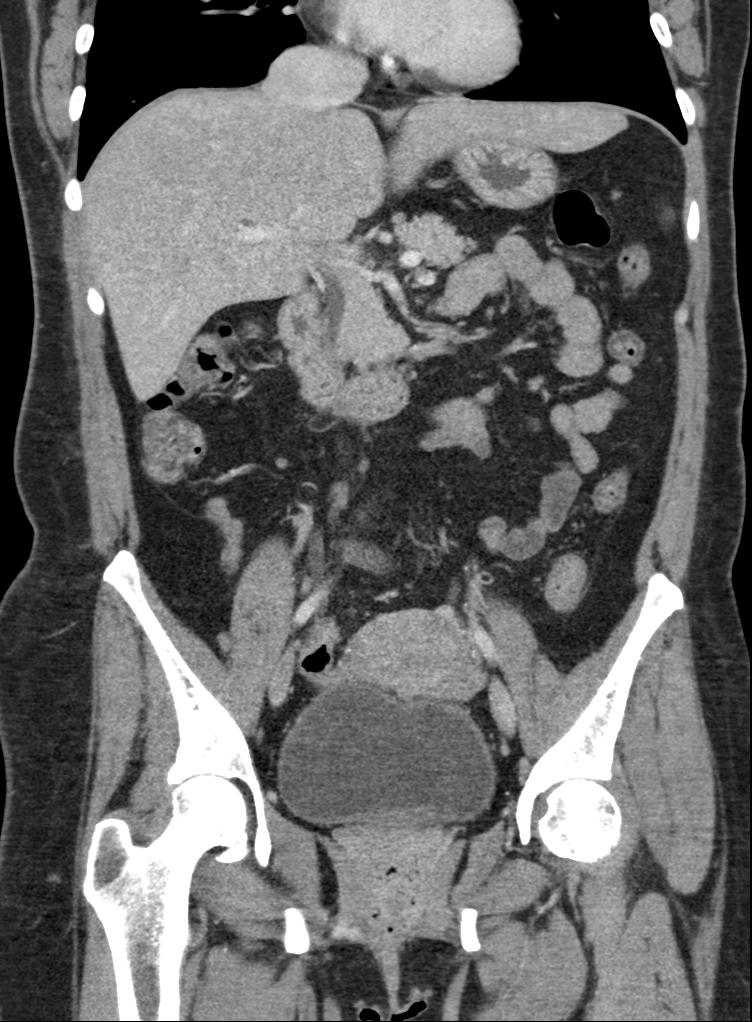
[im 52/94  soft-tissue]
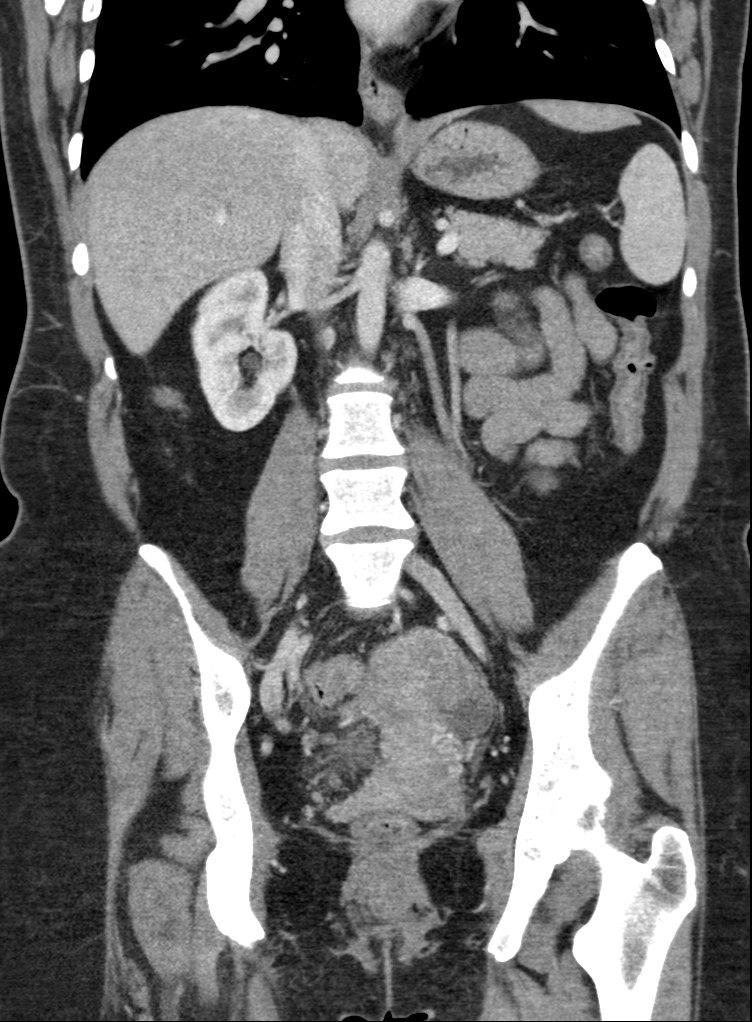

[12 of 46 positions shown; findings below may reference images not displayed]

FINDINGS: Basilar subsegmental atelectasis/ scarring.

Portions of bowel are under distended evaluation slightly limited.
No extra luminal bowel inflammatory process or free intraperitoneal
air. The tiny amount of fluid in the pelvis may be related to recent
ovulation. No inflammation noted surrounding the appendix or
terminal ileum.

Bilateral ovarian cyst/follicles largest on the left measuring up to
2.3 cm.

Post cholecystectomy.

No worrisome hepatic, splenic, pancreatic, adrenal or right renal
lesion. 1.2 cm left renal cyst incidentally noted.

No abdominal aortic aneurysm. Mild atherosclerotic type changes of
the lower abdominal aorta with small noncalcified plaque.

No osseous destructive lesion.

Noncontrast filled imaging of the urinary bladder without
abnormality.

Scattered small lymph nodes without adenopathy.

No soft tissue abnormality noted.
IMPRESSION: Portions of bowel are under distended and evaluation slightly
limited. No extra luminal bowel inflammatory process or free
intraperitoneal air. The tiny amount of fluid in the pelvis may be
related to recent ovulation. No inflammation noted surrounding the
appendix or terminal ileum.

Bilateral ovarian cyst/follicles largest on the left measuring up to
2.3 cm.

## 2015-04-02 IMAGING — US US PELVIS COMPLETE
1 series · 14 of 25 positions shown · non-contrast
Comparison: Pelvis CT obtained earlier today.

ADDENDUM:
Blood flow is demonstrated within both ovaries with color Doppler.
Duplex Doppler was not requested or performed.
CLINICAL DATA: Right lower quadrant abdominal pain for the past
week. Nausea, vomiting and diarrhea. Left ovarian follicles at CT
earlier today. Negative pregnancy test.

EXAM:
TRANSABDOMINAL ULTRASOUND OF PELVIS
TECHNIQUE: Transabdominal ultrasound examination of the pelvis was performed
including evaluation of the uterus, ovaries, adnexal regions, and
pelvic cul-de-sac.

[Series 1: us pelvis complete · 0.20mm/px · 14 of 134 slices shown]
[im 1/134]
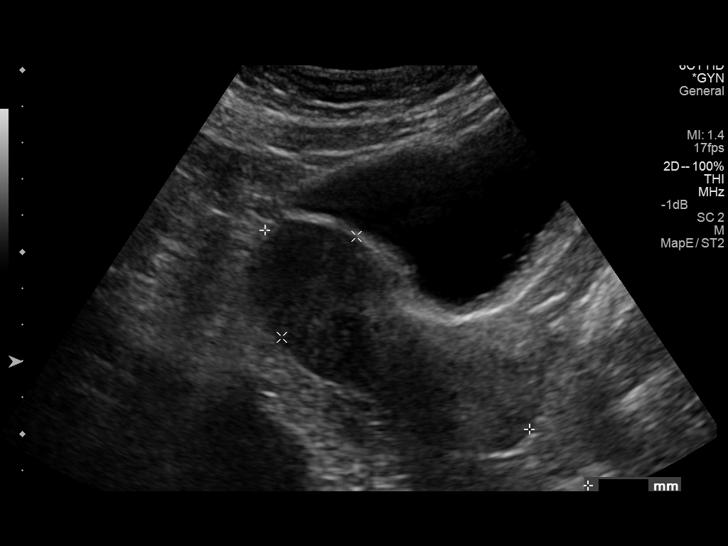
[im 12/134]
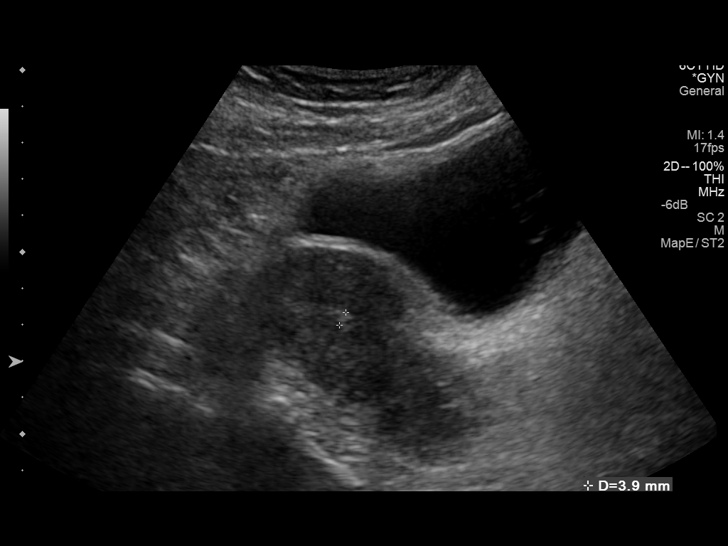
[im 23/134]
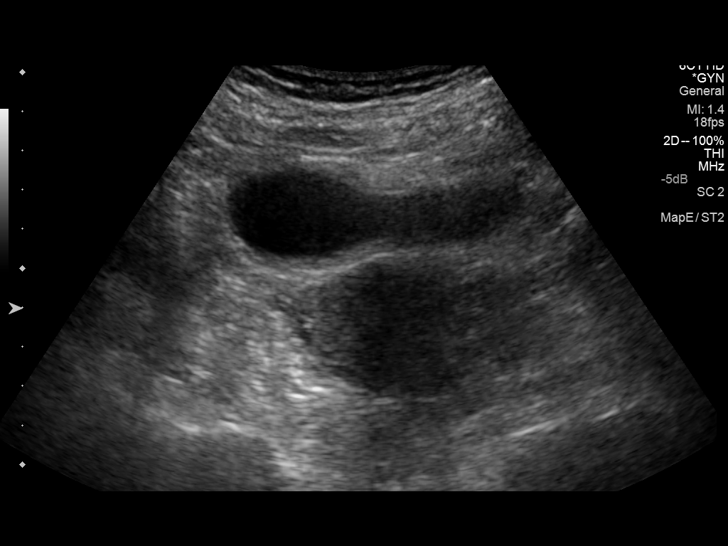
[im 34/134]
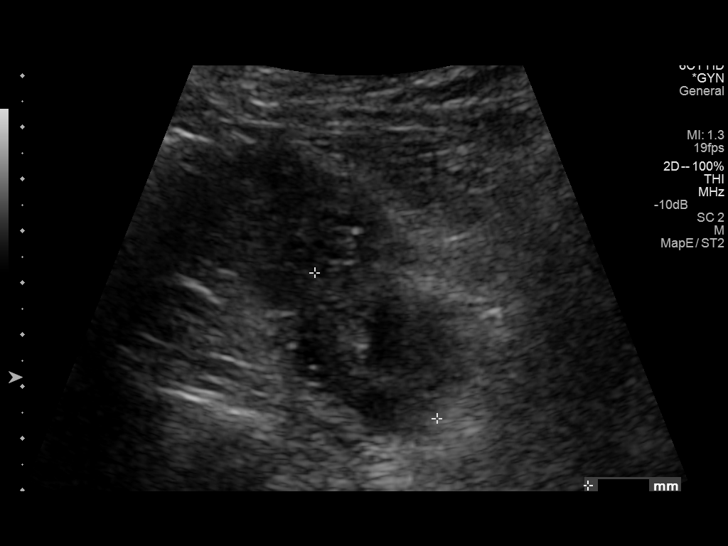
[im 45/134]
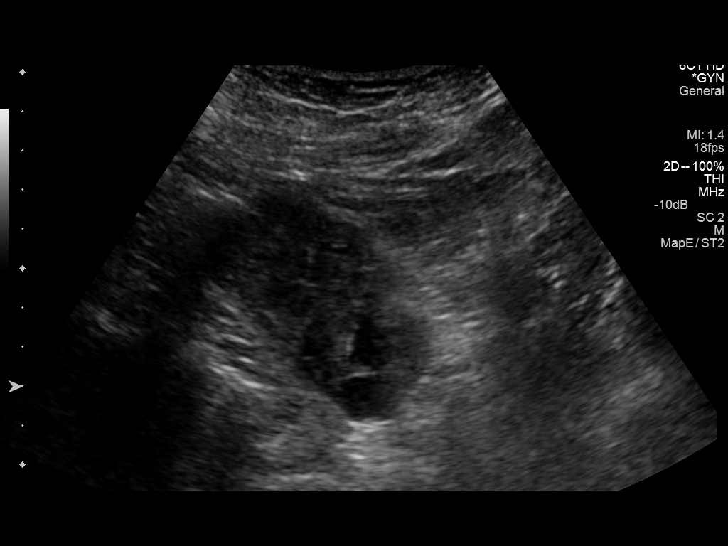
[im 50/134]
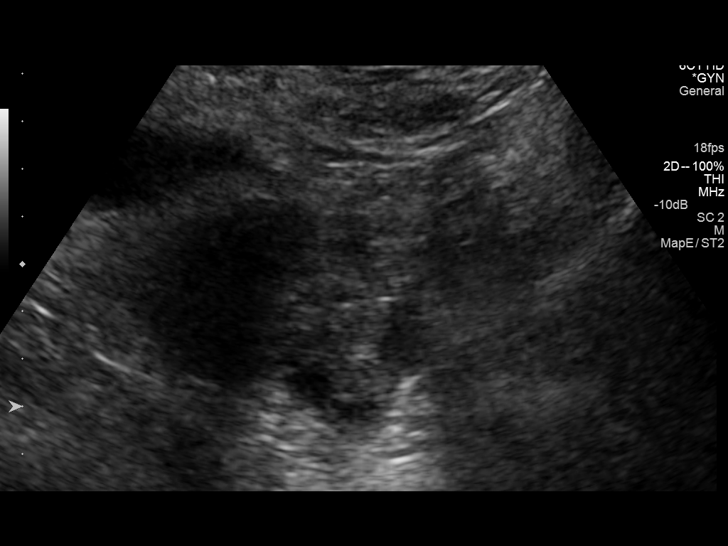
[im 61/134]
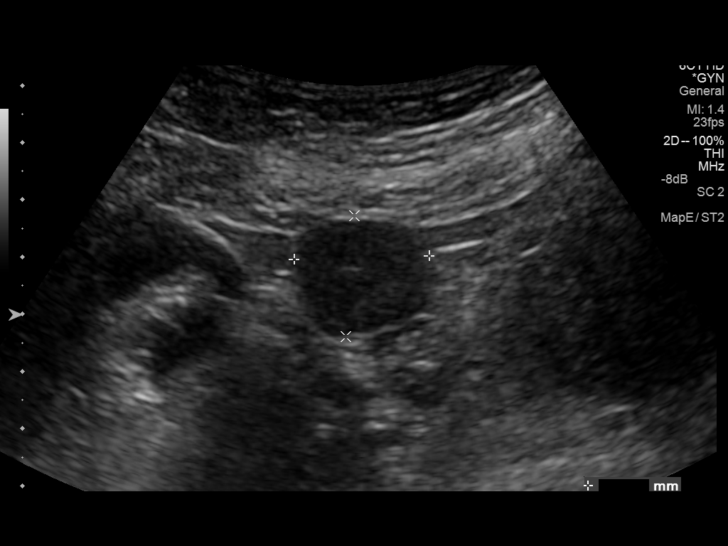
[im 73/134]
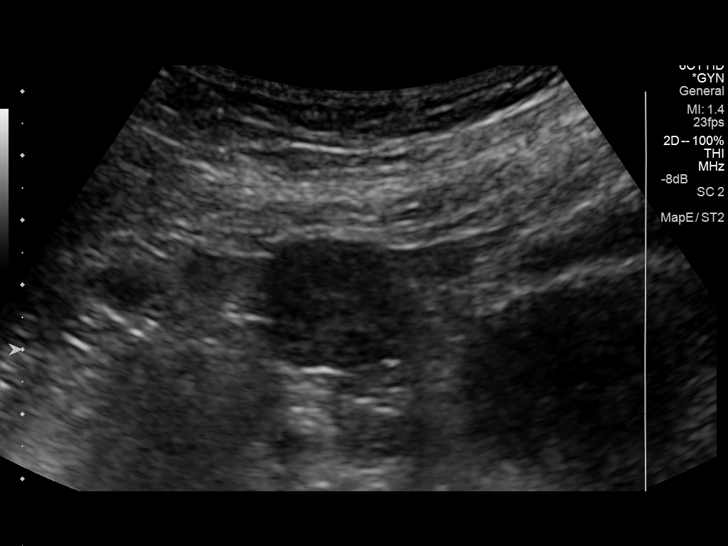
[im 84/134]
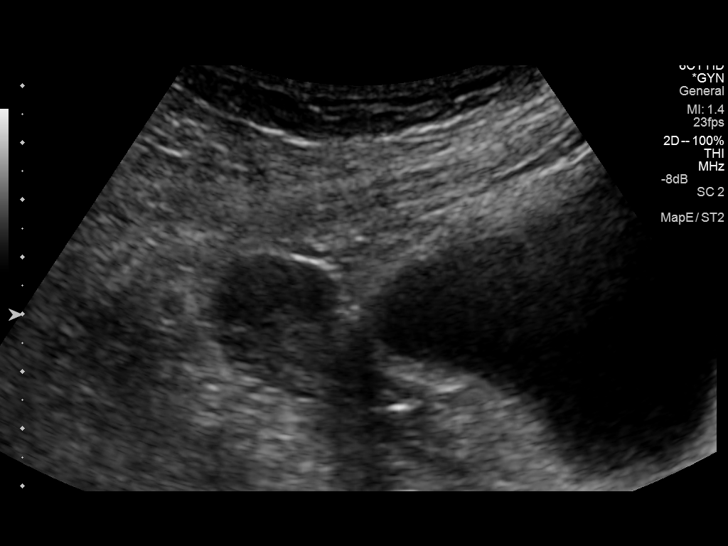
[im 89/134]
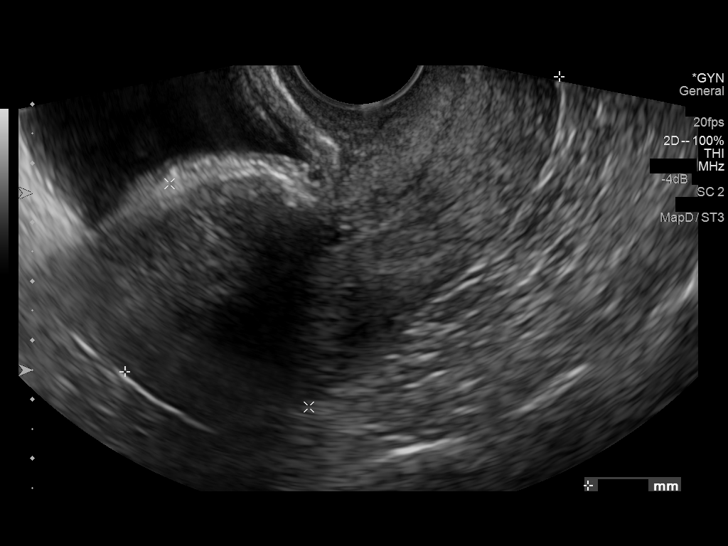
[im 100/134]
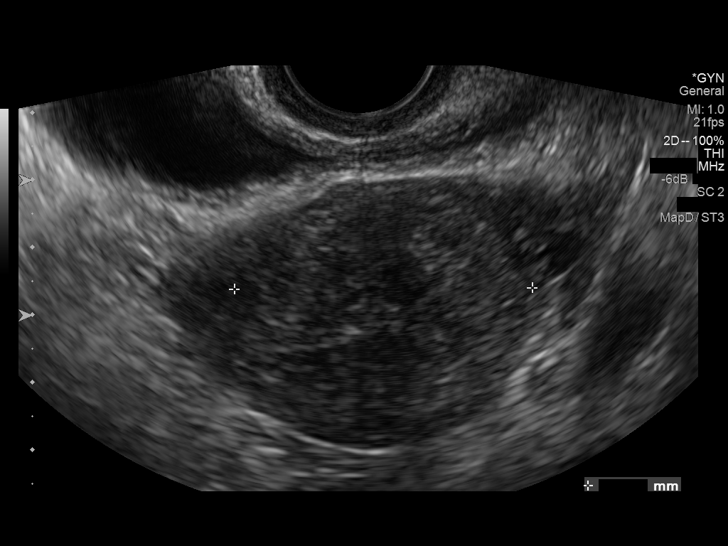
[im 111/134]
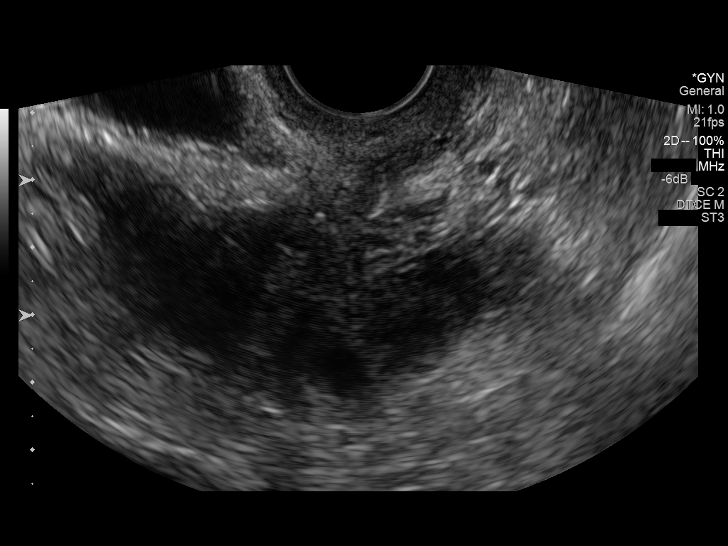
[im 122/134]
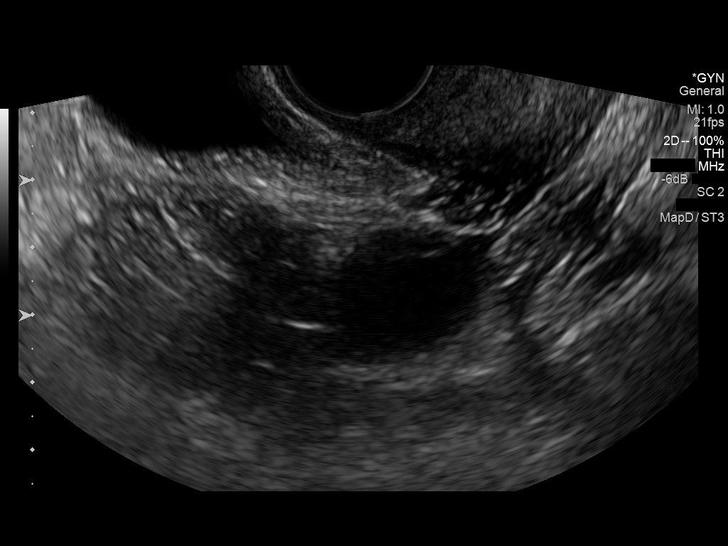
[im 134/134]
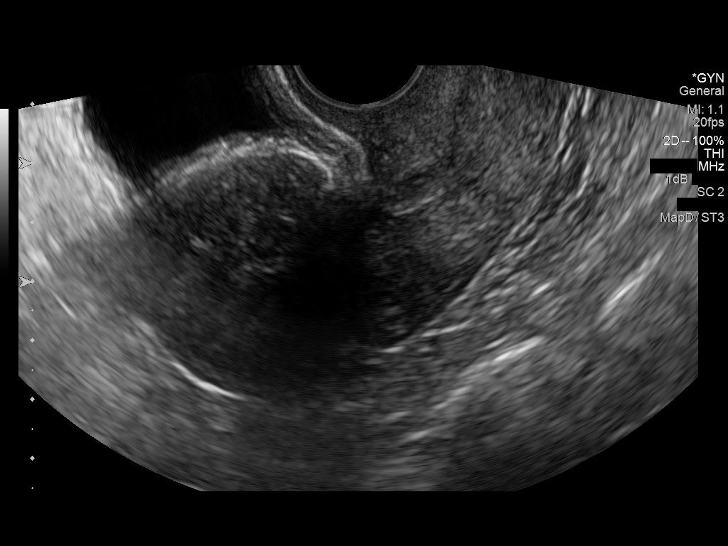

[14 of 25 positions shown; findings below may reference images not displayed]

FINDINGS: Uterus

Measurements: 8.9 x 4.5 x 4.4 cm. No fibroids or other mass
visualized.

Endometrium

Thickness: 2.5 mm.  No focal abnormality visualized.

Right ovary

Measurements: 2.4 x 2.4 x 2.1 cm. Normal appearance/no adnexal mass.

Left ovary

Measurements: 3.3 x 1.9 x 1.5 cm. Previously noted follicles.

Other findings: Trace amount of free peritoneal fluid, within normal
limits of physiological fluid.
IMPRESSION: Normal examination.

## 2015-07-25 DIAGNOSIS — E860 Dehydration: Secondary | ICD-10-CM | POA: Insufficient documentation

## 2015-07-25 DIAGNOSIS — E876 Hypokalemia: Secondary | ICD-10-CM | POA: Insufficient documentation

## 2015-07-25 DIAGNOSIS — R55 Syncope and collapse: Secondary | ICD-10-CM | POA: Insufficient documentation

## 2015-07-25 DIAGNOSIS — G40909 Epilepsy, unspecified, not intractable, without status epilepticus: Secondary | ICD-10-CM | POA: Insufficient documentation

## 2015-11-13 DIAGNOSIS — F319 Bipolar disorder, unspecified: Secondary | ICD-10-CM | POA: Insufficient documentation

## 2015-11-13 DIAGNOSIS — F419 Anxiety disorder, unspecified: Secondary | ICD-10-CM | POA: Insufficient documentation

## 2015-11-13 DIAGNOSIS — G40309 Generalized idiopathic epilepsy and epileptic syndromes, not intractable, without status epilepticus: Secondary | ICD-10-CM | POA: Insufficient documentation

## 2019-08-24 DIAGNOSIS — C50919 Malignant neoplasm of unspecified site of unspecified female breast: Secondary | ICD-10-CM

## 2019-08-24 HISTORY — DX: Malignant neoplasm of unspecified site of unspecified female breast: C50.919

## 2019-12-06 DIAGNOSIS — C50411 Malignant neoplasm of upper-outer quadrant of right female breast: Secondary | ICD-10-CM | POA: Insufficient documentation

## 2019-12-06 DIAGNOSIS — C50911 Malignant neoplasm of unspecified site of right female breast: Secondary | ICD-10-CM | POA: Insufficient documentation

## 2020-01-02 DIAGNOSIS — C50411 Malignant neoplasm of upper-outer quadrant of right female breast: Secondary | ICD-10-CM

## 2020-01-16 DIAGNOSIS — C50411 Malignant neoplasm of upper-outer quadrant of right female breast: Secondary | ICD-10-CM

## 2020-04-03 DIAGNOSIS — C50411 Malignant neoplasm of upper-outer quadrant of right female breast: Secondary | ICD-10-CM

## 2020-07-03 NOTE — Progress Notes (Signed)
[facility]  [address] [place],  [place]  [address] [phone]  Clinic Day:  07/04/2020  Referring physician: Cherene Altes, MD   This document serves as a record of services personally performed by Hosie Poisson, MD. It was created on their behalf by Curry,Lauren E, a trained medical scribe. The creation of this record is based on the scribe's personal observations and the provider's statements to them.   CHIEF COMPLAINT:  CC: Stage IA invasive ductal carcinoma  Current Treatment:  Tamoxifene for a total of 5 years   HISTORY OF PRESENT ILLNESS:  Brianna Merritt is a 44 y.o. 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  This began when she went in for annual mammography on March 8th which revealed possible asymmetries within the bilateral breasts.  Diagnostic bilateral mammogram revealed a heterogeneous hypoechoic irregular mass with microlobulated and angular margins at the 10 o'clock position 6 cm from the nipple measuring 7 x 9 x 6 mm in the right breast.  Ultrasound of the right axilla was negative.  No evidence of malignancy was seen in the left breast.  She underwent ultrasound guided biopsy of the right breast mass on April 12th and surgical pathology from this procedure revealed invasive ductal carcinoma, grade 2, and DCIS with necrosis.  The invasive carcinoma involved all cores measuring 8 mm in maximal extent.  Estrogen receptor was 95% positive and progesterone receptor was 100% positive.  HER2 was equivocal 2+, but HER2 by FISH was negative.  The [facility] genetic panel was recommended and pursued, and she was positive for a CHEK2 heterozygous mutation.  This is a high risk gene related to breast cancer, and an elevated risk for colorectal cancer.  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  She met with Rosanne Sack, PA to discuss these results.  She  was referred to Dr. Lilia Pro and underwent lumpectomy and right axillary sentinel lymph node biopsy on April 28th and surgical pathology confirmed invasive ductal carcinoma, grade 2, 8 mm, and ductal carcinoma in situ, intermediate nuclear grade.  Margins were free of neoplasm.  One sentinel lymph node was negative for metastatic carcinoma (0/1), staging this as a T1b N0 M0.  She states that she is healing well, but has residual pain of her right axilla.  She also notes some mild pain of her right shoulder.  She has a seizure disorder, but states that she has not had a seizure for about 4 years.  She started menarche around age 74.  She has had 5 pregnancies with 4 live births, and had her first child at age 63.  She is premenopausal, and continues to have irregular menses.  She was placed on oral contraceptives with Lo Loestrin 1 mg/10 mcg and was on this for about 5 months.  This was discontinued following her diagnosis.   EndoPredict revealed a 12 gene molecular score of 7.2 with an EPclin score of 3.0, which is considered low risk.  Her risk for distant recurrence within 10 years is 7.4% with adjuvant radiation and endocrine therapy alone, with an absolute chemotherapy benefit of 1.9%.  Her risk for distant recurrence in 5-15 years is 5.8%.  I therefore did not recommend chemotherapy but did stress the importance of hormonal therapy.  She completed adjuvant radiation on July 15th.   INTERVAL HISTORY:  Brianna Merritt is here for routine follow up and reports chronic fatigue and chills  during the day.  As she had not had blood work done recently, I will add a CBC, CMP, TSH and T4 for further evaluation.  CBC and CMP were normal  6 months ago.  She started tamoxifene daily back in September and has continued this without significant difficulty.  She does note irregular periods with dark clotting.  She notes tissue loss of the left breast.  In addition to her normal medications she is taking Buspar 30 mg BID.  She has  not been taking Effexor.  Her  appetite is good, and she has lost 2 pounds since her last visit.  She denies fever, chills or other signs of infection.  She denies nausea, vomiting, bowel issues, or abdominal pain.  She denies sore throat, cough, dyspnea, or chest pain.   REVIEW OF SYSTEMS:  Review of Systems  Constitutional: Positive for chills and fatigue (moderate).  Endocrine: Positive for hot flashes (at night).  All other systems reviewed and are negative.    VITALS:  Blood pressure 108/62, pulse 69, temperature 98.1 F (36.7 C), resp. rate 18, height 5' (1.524 m), weight 147 lb 11.2 oz (67 kg), SpO2 99 %.  Wt Readings from Last 3 Encounters:  07/04/20 147 lb 11.2 oz (67 kg)  01/11/14 160 lb 14.4 oz (73 kg)  11/06/13 167 lb (75.8 kg)    Body mass index is 28.85 kg/m.  Performance status (ECOG): 1 - Symptomatic but completely ambulatory  PHYSICAL EXAM:  Physical Exam Constitutional:      General: She is not in acute distress.    Appearance: Normal appearance. She is normal weight.  HENT:     Head: Normocephalic and atraumatic.  Eyes:     General: No scleral icterus.    Extraocular Movements: Extraocular movements intact.     Conjunctiva/sclera: Conjunctivae normal.     Pupils: Pupils are equal, round, and reactive to light.  Cardiovascular:     Rate and Rhythm: Normal rate and regular rhythm.     Pulses: Normal pulses.     Heart sounds: Normal heart sounds. No murmur heard.  No friction rub. No gallop.   Pulmonary:     Effort: Pulmonary effort is normal. No respiratory distress.     Breath sounds: Normal breath sounds.  Chest:     Breasts:        Right: Normal.        Left: Normal.     Comments: She has a well healed scar in the upper outer quadrant of the right and axilla.  Chronic nipple inversion on the left side.  No masses in either breast. Abdominal:     General: Bowel sounds are normal. There is no distension.     Palpations: Abdomen is soft. There is no  mass.     Tenderness: There is no abdominal tenderness.  Musculoskeletal:        General: Normal range of motion.     Cervical back: Normal range of motion and neck supple.     Right lower leg: No edema.     Left lower leg: No edema.  Lymphadenopathy:     Cervical: No cervical adenopathy.  Skin:    General: Skin is warm and dry.  Neurological:     General: No focal deficit present.     Mental Status: She is alert and oriented to person, place, and time. Mental status is at baseline.  Psychiatric:        Mood and Affect: Mood normal.  Behavior: Behavior normal.        Thought Content: Thought content normal.        Judgment: Judgment normal.     LABS:   CBC Latest Ref Rng & Units 06/08/2014 05/09/2014 01/11/2014  WBC 4.0 - 10.5 K/uL 10.3 9.2 10.0  Hemoglobin 12.0 - 15.0 g/dL 14.7 13.6 14.6  Hematocrit 36 - 46 % 45.4 40.7 43.1  Platelets 150 - 400 K/uL 318 299 323   CMP Latest Ref Rng & Units 06/08/2014 05/09/2014 01/11/2014  Glucose 70 - 99 mg/dL 138(H) 92 89  BUN 6 - 23 mg/dL _0 Creatinine 0.50 - 1.10 mg/dL 0.77 0.67 0.80  Sodium 137 - 147 mEq/L 139 143 140  Potassium 3.7 - 5.3 mEq/L 3.5(L) 3.6(L) 3.8  Chloride 96 - 112 mEq/L 101 105 106  CO2 19 - 32 mEq/L _1 Calcium 8.4 - 10.5 mg/dL 9.2 8.9 8.8  Total Protein 6.0 - 8.3 g/dL 7.1 - -  Total Bilirubin 0.3 - 1.2 mg/dL 0.3 - -  Alkaline Phos 39 - 117 U/L 84 - -  AST 0 - 37 U/L 14 - -  ALT 0 - 35 U/L 15 - -     No results found for: CEA1 / No results found for: CEA1 No results found for: PSA1 No results found for: AGT364 No results found for: WOE321  No results found for: TOTALPROTELP, ALBUMINELP, A1GS, A2GS, BETS, BETA2SER, GAMS, MSPIKE, SPEI No results found for: TIBC, FERRITIN, IRONPCTSAT No results found for: LDH   STUDIES:  No results found.   Allergies:  Allergies  Allergen Reactions  . Ketorolac Itching  . Ketorolac Tromethamine Itching  . Sumatriptan Other (See Comments) and Anaphylaxis     Stroke risk  . Meperidine Hcl     Combative  . Meperidine Other (See Comments)    combative Other reaction(s): Other (See Comments) combative  . Ibuprofen Itching and Rash    Current Medications: Current Outpatient Medications  Medication Sig Dispense Refill  . busPIRone (BUSPAR) 30 MG tablet Take 30 mg by mouth in the morning, at noon, and at bedtime.    . hydrOXYzine (ATARAX/VISTARIL) 50 MG tablet Take 1-1.5 tablets by mouth at bedtime as needed.    . tamoxifen (NOLVADEX) 20 MG tablet Take 20 mg by mouth daily.    Marland Kitchen albuterol (PROVENTIL HFA;VENTOLIN HFA) 108 (90 BASE) MCG/ACT inhaler Inhale 2 puffs into the lungs every 6 (six) hours as needed for wheezing or shortness of breath.     . clonazePAM (KLONOPIN) 1 MG tablet Take 1 mg by mouth as directed. Take 0.5 tablet four times a day     No current facility-administered medications for this visit.     ASSESSMENT & PLAN:   Assessment:   1.  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  Axillary lymph node was negative for metastatic carcinoma (0/1).  HER2 negative by FISH.  She proceeded with adjuvant radiation which she completed on July 15th.  We placed her on hormonal therapy with tamoxifen daily for a total of 5 years starting in September.  2.  CHEK2 heterozygous gene mutation.  This is a high risk gene related to an increased risk of breast cancer with her cancer risk being 20-31% when compared to the risk for the general population of 10.2%.  Her risk for developing a secondary primary within 10  years is up to 29%.  This gene is also associated with a possible elevated risk for colorectal cancer.  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   3.  Her last colonoscopy and EGD was in 2004.  Colonoscopy was unremarkable.  EGD revealed GERD with hiatal hernia,  mild distal esophagitis and a healed gastric ulcer.  I advise she have repeat colonoscopy some time soon in view of her gene mutation.  We will refer her to GI.  4.  Seizure disorder; she has been without an episode in about 4 years.  5.  Tobacco abuse.  I discussed the importance of the cessation of smoking.   6.  Chronic fatigue and chills throughout the day.  I will order CBC and CMP and also check TSH and T4 for further evaluation.  Plan: She started hormonal therapy with tamoxifen in September and has tolerated this without significant difficulty.  We will plan to continue for a total of 5 years.  I will order CBC, CMP, TSH and T4 today and call her with the results.   We will plan to see her back in 3 months for examination.  I again recommended that she undergo repeat colonoscopy due to her CHEK2 mutation, and will refer her to a gastroenterologist.  She understands and agrees to this plan of care.  I have answered her questions, and she knows to call with any concerns.    I provided 23 minutes (10:50 AM - 11:13 AM) of face-to-face time during this this encounter and > 50% was spent counseling as documented under my assessment and plan.    Derwood Kaplan, MD [facility] AT [facility] [address] [place] [address] Dept: [phone] Dept Fax: [phone]   I, Rita Ohara, am acting as scribe for Derwood Kaplan, MD  I have reviewed this report as typed by the medical scribe, and it is complete and accurate.

## 2020-07-04 ENCOUNTER — Other Ambulatory Visit: Payer: Self-pay

## 2020-07-04 ENCOUNTER — Telehealth: Payer: Self-pay | Admitting: Oncology

## 2020-07-04 ENCOUNTER — Inpatient Hospital Stay: Payer: Medicare Other | Attending: Oncology | Admitting: Oncology

## 2020-07-04 ENCOUNTER — Encounter: Payer: Self-pay | Admitting: Oncology

## 2020-07-04 ENCOUNTER — Inpatient Hospital Stay: Payer: Medicare Other

## 2020-07-04 VITALS — BP 108/62 | HR 69 | Temp 98.1°F | Resp 18 | Ht 60.0 in | Wt 147.7 lb

## 2020-07-04 DIAGNOSIS — Z1509 Genetic susceptibility to other malignant neoplasm: Secondary | ICD-10-CM

## 2020-07-04 DIAGNOSIS — K449 Diaphragmatic hernia without obstruction or gangrene: Secondary | ICD-10-CM | POA: Insufficient documentation

## 2020-07-04 DIAGNOSIS — K2100 Gastro-esophageal reflux disease with esophagitis, without bleeding: Secondary | ICD-10-CM | POA: Insufficient documentation

## 2020-07-04 DIAGNOSIS — Z923 Personal history of irradiation: Secondary | ICD-10-CM | POA: Diagnosis not present

## 2020-07-04 DIAGNOSIS — G4700 Insomnia, unspecified: Secondary | ICD-10-CM | POA: Insufficient documentation

## 2020-07-04 DIAGNOSIS — Z1589 Genetic susceptibility to other disease: Secondary | ICD-10-CM

## 2020-07-04 DIAGNOSIS — F17210 Nicotine dependence, cigarettes, uncomplicated: Secondary | ICD-10-CM | POA: Insufficient documentation

## 2020-07-04 DIAGNOSIS — Z170 Estrogen receptor positive status [ER+]: Secondary | ICD-10-CM | POA: Insufficient documentation

## 2020-07-04 DIAGNOSIS — Z8711 Personal history of peptic ulcer disease: Secondary | ICD-10-CM | POA: Insufficient documentation

## 2020-07-04 DIAGNOSIS — Z79899 Other long term (current) drug therapy: Secondary | ICD-10-CM | POA: Insufficient documentation

## 2020-07-04 DIAGNOSIS — Z79810 Long term (current) use of selective estrogen receptor modulators (SERMs): Secondary | ICD-10-CM | POA: Diagnosis not present

## 2020-07-04 DIAGNOSIS — M25511 Pain in right shoulder: Secondary | ICD-10-CM | POA: Diagnosis not present

## 2020-07-04 DIAGNOSIS — C50919 Malignant neoplasm of unspecified site of unspecified female breast: Secondary | ICD-10-CM | POA: Diagnosis not present

## 2020-07-04 DIAGNOSIS — R109 Unspecified abdominal pain: Secondary | ICD-10-CM

## 2020-07-04 DIAGNOSIS — R5383 Other fatigue: Secondary | ICD-10-CM | POA: Diagnosis not present

## 2020-07-04 DIAGNOSIS — C50411 Malignant neoplasm of upper-outer quadrant of right female breast: Secondary | ICD-10-CM | POA: Diagnosis present

## 2020-07-04 DIAGNOSIS — R6883 Chills (without fever): Secondary | ICD-10-CM | POA: Insufficient documentation

## 2020-07-04 DIAGNOSIS — G40909 Epilepsy, unspecified, not intractable, without status epilepticus: Secondary | ICD-10-CM | POA: Insufficient documentation

## 2020-07-04 DIAGNOSIS — F603 Borderline personality disorder: Secondary | ICD-10-CM | POA: Insufficient documentation

## 2020-07-04 DIAGNOSIS — R5382 Chronic fatigue, unspecified: Secondary | ICD-10-CM

## 2020-07-04 DIAGNOSIS — Z1502 Genetic susceptibility to malignant neoplasm of ovary: Secondary | ICD-10-CM

## 2020-07-04 DIAGNOSIS — F1320 Sedative, hypnotic or anxiolytic dependence, uncomplicated: Secondary | ICD-10-CM | POA: Insufficient documentation

## 2020-07-04 LAB — CBC WITH DIFFERENTIAL/PLATELET
Abs Immature Granulocytes: 0.01 10*3/uL (ref 0.00–0.07)
Basophils Absolute: 0 10*3/uL (ref 0.0–0.1)
Basophils Relative: 1 %
Eosinophils Absolute: 0.1 10*3/uL (ref 0.0–0.5)
Eosinophils Relative: 1 %
HCT: 40.6 % (ref 36.0–46.0)
Hemoglobin: 13.5 g/dL (ref 12.0–15.0)
Immature Granulocytes: 0 %
Lymphocytes Relative: 36 %
Lymphs Abs: 2.3 10*3/uL (ref 0.7–4.0)
MCH: 32.5 pg (ref 26.0–34.0)
MCHC: 33.3 g/dL (ref 30.0–36.0)
MCV: 97.8 fL (ref 80.0–100.0)
Monocytes Absolute: 0.4 10*3/uL (ref 0.1–1.0)
Monocytes Relative: 6 %
Neutro Abs: 3.6 10*3/uL (ref 1.7–7.7)
Neutrophils Relative %: 56 %
Platelets: 267 10*3/uL (ref 150–400)
RBC: 4.15 MIL/uL (ref 3.87–5.11)
RDW: 12 % (ref 11.5–15.5)
WBC: 6.4 10*3/uL (ref 4.0–10.5)
nRBC: 0 % (ref 0.0–0.2)

## 2020-07-04 LAB — COMPREHENSIVE METABOLIC PANEL
ALT: 15 U/L (ref 0–44)
AST: 19 U/L (ref 15–41)
Albumin: 4.3 g/dL (ref 3.5–5.0)
Alkaline Phosphatase: 32 U/L — ABNORMAL LOW (ref 38–126)
Anion gap: 11 (ref 5–15)
BUN: 12 mg/dL (ref 6–20)
CO2: 21 mmol/L — ABNORMAL LOW (ref 22–32)
Calcium: 9.1 mg/dL (ref 8.9–10.3)
Chloride: 108 mmol/L (ref 98–111)
Creatinine, Ser: 0.8 mg/dL (ref 0.44–1.00)
GFR, Estimated: >60 mL/min (ref >60)
Glucose, Bld: 95 mg/dL (ref 70–99)
Potassium: 3.9 mmol/L (ref 3.5–5.1)
Sodium: 140 mmol/L (ref 135–145)
Total Bilirubin: 0.3 mg/dL (ref 0.3–1.2)
Total Protein: 7.4 g/dL (ref 6.5–8.1)

## 2020-07-04 LAB — TSH: TSH: 0.472 u[IU]/mL (ref 0.350–4.500)

## 2020-07-04 NOTE — Telephone Encounter (Signed)
Per 11/12 los next appt given to patient

## 2020-07-05 LAB — T4: T4, Total: 7.8 ug/dL (ref 4.5–12.0)

## 2020-07-07 ENCOUNTER — Telehealth: Payer: Self-pay

## 2020-07-07 DIAGNOSIS — Z1502 Genetic susceptibility to malignant neoplasm of ovary: Secondary | ICD-10-CM | POA: Insufficient documentation

## 2020-07-07 DIAGNOSIS — C50919 Malignant neoplasm of unspecified site of unspecified female breast: Secondary | ICD-10-CM | POA: Insufficient documentation

## 2020-07-07 NOTE — Telephone Encounter (Signed)
Patient notified of the lab results

## 2020-07-07 NOTE — Telephone Encounter (Signed)
-----   Message from Derwood Kaplan, MD sent at 07/07/2020  7:22 AM EST ----- Regarding: call pt Tell her labs all normal incl. Thyroid.  CC to her PCP

## 2020-07-22 ENCOUNTER — Telehealth: Payer: Self-pay | Admitting: Oncology

## 2020-07-22 NOTE — Telephone Encounter (Signed)
Per 11/12 los f/u appt given to patient

## 2020-07-23 ENCOUNTER — Telehealth: Payer: Self-pay | Admitting: Oncology

## 2020-07-23 NOTE — Telephone Encounter (Signed)
Per 11/12 los next appt given to patient

## 2020-07-24 ENCOUNTER — Telehealth: Payer: Self-pay | Admitting: Oncology

## 2020-07-24 NOTE — Telephone Encounter (Signed)
12/1 los next appt given to patient

## 2020-08-27 ENCOUNTER — Ambulatory Visit: Payer: Medicare Other | Admitting: Gastroenterology

## 2020-09-17 ENCOUNTER — Ambulatory Visit: Payer: Medicare Other | Admitting: Gastroenterology

## 2020-10-01 NOTE — Progress Notes (Incomplete)
Brianna Merritt  [address] [place],  [place]  [address] [phone]  Clinic Day:  10/01/2020  Referring physician: Jeanie Sewer, NP   This document serves as a record of services personally performed by Brianna Poisson, MD. It was created on their behalf by Brianna,Lauren Merritt, a trained medical scribe. The creation of this record is based on the scribe's personal observations and the provider's statements to them.   CHIEF COMPLAINT:  CC: Stage IA invasive ductal carcinoma  Current Treatment:  Tamoxifene for a total of 5 years   HISTORY OF PRESENT ILLNESS:  Brianna Merritt is a 45 y.o. 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  This began when she went in for annual mammography on March 8th which revealed possible asymmetries within the bilateral breasts.  Diagnostic bilateral mammogram revealed a heterogeneous hypoechoic irregular mass with microlobulated and angular margins at the 10 o'clock position 6 cm from the nipple measuring 7 x 9 x 6 mm in the right breast.  Ultrasound of the right axilla was negative.  No evidence of malignancy was seen in the left breast.  She underwent ultrasound guided biopsy of the right breast mass on April 12th and surgical pathology from this procedure revealed invasive ductal carcinoma, grade 2, and DCIS with necrosis.  The invasive carcinoma involved all cores measuring 8 mm in maximal extent.  Estrogen receptor was 95% positive and progesterone receptor was 100% positive.  HER2 was equivocal 2+, but HER2 by FISH was negative.  The [facility] genetic panel was recommended and pursued, and she was positive for a CHEK2 heterozygous mutation.  This is a high risk gene related to breast cancer, and an elevated risk for colorectal cancer.  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  She met with Brianna Sack, PA to discuss these results.   She was referred to Dr. Lilia Merritt and underwent lumpectomy and right axillary sentinel lymph node biopsy on April 28th and surgical pathology confirmed invasive ductal carcinoma, grade 2, 8 mm, and ductal carcinoma in situ, intermediate nuclear grade.  Margins were free of neoplasm.  One sentinel lymph node was negative for metastatic carcinoma (0/1), staging this as a T1b N0 M0.  She states that she is healing well, but has residual pain of her right axilla.  She also notes some mild pain of her right shoulder.  She has a seizure disorder, but states that she has not had a seizure for about 4 years.  She started menarche around age 37.  She has had 5 pregnancies with 4 live births, and had her first child at age 20.  She is premenopausal, and continues to have irregular menses.  She was placed on oral contraceptives with Lo Loestrin 1 mg/10 mcg and was on this for about 5 months.  This was discontinued following her diagnosis.   EndoPredict revealed a 12 gene molecular score of 7.2 with an EPclin score of 3.0, which is considered low risk.  Her risk for distant recurrence within 10 years is 7.4% with adjuvant radiation and endocrine therapy alone, with an absolute chemotherapy benefit of 1.9%.  Her risk for distant recurrence in 5-15 years is 5.8%.  I therefore did not recommend chemotherapy but did stress the importance of hormonal therapy.  She completed adjuvant radiation on July 15th.   INTERVAL HISTORY:  [name] is here for routine follow up and reports chronic fatigue and chills  during the day.  As she had not had blood work done recently, I will add a CBC, CMP, TSH and T4 for further evaluation.  CBC and CMP were normal  6 months ago.  She started tamoxifene daily back in September and has continued this without significant difficulty.  She does note irregular periods with dark clotting.  She notes tissue loss of the left breast.  In addition to her normal medications she is taking Buspar 30 mg BID.  She  has not been taking Effexor.  Her  appetite is good, and she has lost 2 pounds since her last visit.  She denies fever, chills or other signs of infection.  She denies nausea, vomiting, bowel issues, or abdominal pain.  She denies sore throat, cough, dyspnea, or chest pain.  Brianna Merritt is here for routine follow up ***.  She continues tamoxifen daily without significant difficulty.   Her  appetite is good, and she has gained/lost _ pounds since her last visit.  She denies fever, chills or other signs of infection.  She denies nausea, vomiting, bowel issues, or abdominal pain.  She denies sore throat, cough, dyspnea, or chest pain.  REVIEW OF SYSTEMS:  Review of Systems - Oncology   VITALS:  There were no vitals taken for this visit.  Wt Readings from Last 3 Encounters:  07/04/20 147 lb 11.2 oz (67 kg)  01/11/14 160 lb 14.4 oz (73 kg)  11/06/13 167 lb (75.8 kg)    There is no height or weight on file to calculate BMI.  Performance status (ECOG): 1 - Symptomatic but completely ambulatory  PHYSICAL EXAM:  Physical Exam  LABS:   CBC Latest Ref Rng & Units 07/04/2020 06/08/2014 05/09/2014  WBC 4.0 - 10.5 K/uL 6.4 10.3 9.2  Hemoglobin 12.0 - 15.0 g/dL 13.5 14.7 13.6  Hematocrit 36.0 - 46.0 % 40.6 45.4 40.7  Platelets 150 - 400 K/uL 267 318 299   CMP Latest Ref Rng & Units 07/04/2020 06/08/2014 05/09/2014  Glucose 70 - 99 mg/dL 95 138(H) 92  BUN 6 - 20 mg/dL 12 6 8   Creatinine 0.44 - 1.00 mg/dL 0.80 0.77 0.67  Sodium 135 - 145 mmol/L 140 139 143  Potassium 3.5 - 5.1 mmol/L 3.9 3.5(L) 3.6(L)  Chloride 98 - 111 mmol/L 108 101 105  CO2 22 - 32 mmol/L 21(L) 23 25  Calcium 8.9 - 10.3 mg/dL 9.1 9.2 8.9  Total Protein 6.5 - 8.1 g/dL 7.4 7.1 -  Total Bilirubin 0.3 - 1.2 mg/dL 0.3 0.3 -  Alkaline Phos 38 - 126 U/L 32(L) 84 -  AST 15 - 41 U/L 19 14 -  ALT 0 - 44 U/L 15 15 -     No results found for: CEA1 / No results found for: CEA1 No results found for: PSA1 No results found for:  CYE185 No results found for: TMB311  No results found for: TOTALPROTELP, ALBUMINELP, A1GS, A2GS, BETS, BETA2SER, GAMS, MSPIKE, SPEI No results found for: TIBC, FERRITIN, IRONPCTSAT No results found for: LDH   STUDIES:  No results found.   Allergies:  Allergies  Allergen Reactions  . Ketorolac Itching  . Ketorolac Tromethamine Itching  . Sumatriptan Other (See Comments) and Anaphylaxis    Stroke risk  . Meperidine Hcl     Combative  . Meperidine Other (See Comments)    combative Other reaction(s): Other (See Comments) combative  . Ibuprofen Itching and Rash    Current Medications: Current Outpatient Medications  Medication Sig Dispense Refill  .  albuterol (PROVENTIL HFA;VENTOLIN HFA) 108 (90 BASE) MCG/ACT inhaler Inhale 2 puffs into the lungs every 6 (six) hours as needed for wheezing or shortness of breath.     . busPIRone (BUSPAR) 30 MG tablet Take 30 mg by mouth in the morning, at noon, and at bedtime.    . clonazePAM (KLONOPIN) 1 MG tablet Take 1 mg by mouth as directed. Take 0.5 tablet four times a day    . hydrOXYzine (ATARAX/VISTARIL) 50 MG tablet Take 1-1.5 tablets by mouth at bedtime as needed.    . tamoxifen (NOLVADEX) 20 MG tablet Take 20 mg by mouth daily.     No current facility-administered medications for this visit.     ASSESSMENT & PLAN:   Assessment:   1.  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  Axillary lymph node was negative for metastatic carcinoma (0/1).  HER2 negative by FISH.  She proceeded with adjuvant radiation which she completed on July 15th.  We placed her on hormonal therapy with tamoxifen daily for a total of 5 years starting in September.  2.  CHEK2 heterozygous gene mutation.  This is a high risk gene related to an increased risk of breast cancer with her cancer risk being 20-31% when compared to the risk for the  general population of 10.2%.  Her risk for developing a secondary primary within 10 years is up to 29%.  This gene is also associated with a possible elevated risk for colorectal cancer.  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   3.  Her last colonoscopy and EGD was in 2004.  Colonoscopy was unremarkable.  EGD revealed GERD with hiatal hernia, mild distal esophagitis and a healed gastric ulcer.  I advise she have repeat colonoscopy some time soon in view of her gene mutation.  We will refer her to GI.  4.  Seizure disorder; she has been without an episode in about 4 years.  5.  Tobacco abuse.  I discussed the importance of the cessation of smoking.   6.  Chronic fatigue and chills throughout the day.  I will order CBC and CMP and also check TSH and T4 for further evaluation.  Plan: She started hormonal therapy with tamoxifen in September and has tolerated this without significant difficulty.  We will plan to continue for a total of 5 years.  We will plan to see her back in 3 months for examination.  I again recommended that she undergo repeat colonoscopy due to her CHEK2 mutation, and will refer her to a gastroenterologist.  She understands and agrees to this plan of care.  I have answered her questions, and she knows to call with any concerns.    I provided 23 minutes (10:50 AM - 8:45 AM) of face-to-face time during this this encounter and > 50% was spent counseling as documented under my assessment and plan.    Derwood Kaplan, MD [facility] [address] [place] [address] Dept: [phone] Dept Fax: [phone]   I, Rita Ohara, am acting as scribe for Derwood Kaplan, MD  I have reviewed this report as typed by the medical scribe, and it is complete and accurate.

## 2020-10-02 ENCOUNTER — Other Ambulatory Visit: Payer: Self-pay | Admitting: Oncology

## 2020-10-03 ENCOUNTER — Encounter: Payer: Self-pay | Admitting: Hematology and Oncology

## 2020-10-03 ENCOUNTER — Inpatient Hospital Stay: Payer: Medicare Other | Attending: Oncology | Admitting: Hematology and Oncology

## 2020-10-03 ENCOUNTER — Other Ambulatory Visit: Payer: Self-pay

## 2020-10-03 ENCOUNTER — Telehealth: Payer: Self-pay | Admitting: Hematology and Oncology

## 2020-10-03 ENCOUNTER — Inpatient Hospital Stay: Payer: Medicare Other | Admitting: Oncology

## 2020-10-03 ENCOUNTER — Inpatient Hospital Stay: Payer: Medicare Other

## 2020-10-03 VITALS — BP 100/57 | HR 66 | Temp 98.5°F | Resp 18 | Ht 60.0 in | Wt 141.0 lb

## 2020-10-03 DIAGNOSIS — Z170 Estrogen receptor positive status [ER+]: Secondary | ICD-10-CM | POA: Insufficient documentation

## 2020-10-03 DIAGNOSIS — G40909 Epilepsy, unspecified, not intractable, without status epilepticus: Secondary | ICD-10-CM | POA: Diagnosis not present

## 2020-10-03 DIAGNOSIS — Z923 Personal history of irradiation: Secondary | ICD-10-CM | POA: Diagnosis not present

## 2020-10-03 DIAGNOSIS — R5383 Other fatigue: Secondary | ICD-10-CM | POA: Insufficient documentation

## 2020-10-03 DIAGNOSIS — Z1501 Genetic susceptibility to malignant neoplasm of breast: Secondary | ICD-10-CM | POA: Diagnosis not present

## 2020-10-03 DIAGNOSIS — Z79899 Other long term (current) drug therapy: Secondary | ICD-10-CM | POA: Insufficient documentation

## 2020-10-03 DIAGNOSIS — F17210 Nicotine dependence, cigarettes, uncomplicated: Secondary | ICD-10-CM | POA: Diagnosis not present

## 2020-10-03 DIAGNOSIS — Z8711 Personal history of peptic ulcer disease: Secondary | ICD-10-CM | POA: Diagnosis not present

## 2020-10-03 DIAGNOSIS — K2100 Gastro-esophageal reflux disease with esophagitis, without bleeding: Secondary | ICD-10-CM | POA: Insufficient documentation

## 2020-10-03 DIAGNOSIS — K449 Diaphragmatic hernia without obstruction or gangrene: Secondary | ICD-10-CM | POA: Insufficient documentation

## 2020-10-03 DIAGNOSIS — C50411 Malignant neoplasm of upper-outer quadrant of right female breast: Secondary | ICD-10-CM | POA: Insufficient documentation

## 2020-10-03 LAB — CBC: RBC: 4.07 (ref 3.87–5.11)

## 2020-10-03 LAB — HEPATIC FUNCTION PANEL
ALT: 19 (ref 7–35)
AST: 18 (ref 13–35)
Alkaline Phosphatase: 43 (ref 25–125)
Bilirubin, Total: 0.3

## 2020-10-03 LAB — BASIC METABOLIC PANEL
BUN: 10 (ref 4–21)
CO2: 26 — AB (ref 13–22)
Chloride: 108 (ref 99–108)
Creatinine: 0.8 (ref 0.5–1.1)
Glucose: 103
Potassium: 4.3 (ref 3.4–5.3)
Sodium: 139 (ref 137–147)

## 2020-10-03 LAB — COMPREHENSIVE METABOLIC PANEL
Albumin: 4.2 (ref 3.5–5.0)
Calcium: 9 (ref 8.7–10.7)

## 2020-10-03 LAB — CBC AND DIFFERENTIAL
HCT: 39 (ref 36–46)
Hemoglobin: 13.3 (ref 12.0–16.0)
Neutrophils Absolute: 1.89
Platelets: 213 (ref 150–399)
WBC: 4.2

## 2020-10-03 LAB — TSH: TSH: 0.501 u[IU]/mL (ref 0.350–4.500)

## 2020-10-03 NOTE — Progress Notes (Signed)
[place]  [address] [place],  [place]  [address] [phone]  Clinic Day:  10/03/2020  Referring physician: Jeanie Sewer, NP   CHIEF COMPLAINT:  CC:  Stage I A hormone sensitive breast cancer  Current Treatment:   Tamoxifen 20 mg daily   HISTORY OF PRESENT ILLNESS:  Brianna Merritt is a 45 y.o. 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  Annual mammography in March revealed possible asymmetries within the bilateral breasts. Bilateral diagnostic mammogram revealed a heterogeneous hypoechoic irregular mass with microlobulated and angular margins at the 10 o'clock position 6 cm from the nipple measuring 7 x 9 x 6 mm in the right breast.  Ultrasound of the right axilla was negative.  No evidence of malignancy was seen in the left breast.  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  The invasive carcinoma involved all cores measuring 8 mm in maximal extent.  Estrogen receptor was 95% positive and progesterone receptor was 100% positive.  HER2 was equivocal 2+, but HER2 by FISH was negative.    Due to her personal and family history of breast cancer she underwent genetic testing with the [employer] myRisk Hereditary Cancer Panel.  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  This particular mutation is associated with an increased lifetime risk of breast cancer of 20 to 31%, with the risk of a 2nd breast cancer primary within 10 years of her 1st breast cancer up to 29%.  There is insufficient evidence to recommend prophylactic mastectomy with a CHEK2 mutation. There is also possibly an elevated risk of colorectal cancer associated with a CHEK2 mutation, which cannot be quantified. There was also a variant  of uncertain significance (VUS) found in the MSH2.  Her cancer was treated with lumpectomy lumpectomy and right axillary sentinel lymph node biopsy in April.  Final pathology revealed an 8 mm, grade 2,  invasive ductal carcinoma with ductal carcinoma in situ, intermediate nuclear grade.  Margins were free of neoplasm.  One sentinel lymph node was negative for metastatic carcinoma (0/1).  She was premenopausal, and had irregular menses, so was taking Lo Loestrin 1 mg/10 mcg for about 5 months prior to her diagnosis.  This was discontinued following her diagnosis.   EndoPredict revealed a 12 gene molecular score of 7.2 with an EPclin score of 3.0, which is considered low risk.  Her risk for distant recurrence within 10 years is 7.4% with adjuvant radiation and endocrine therapy alone, with an absolute chemotherapy benefit of 1.9%.  Her risk for distant recurrence in 5-15 years is 5.8%.  Adjuvant chemotherapy was not recommended.  She completed adjuvant radiation  In July. She was placed on adjuvant hormonal therapy with tamoxifen 20 mg daily in September. She was on duloxetine at that time and due to the interaction with tamoxifen, she was switched to venlafaxine. Her last colonoscopy and EGD were in 2004, at which time colonoscopy was unremarkable. She had GERD with a hiatal hernia, mild distal esophagitis and healed gastric ulcer.  We recommended repeat colonoscopy at her visit in August due to the CHEK2 mutation.  INTERVAL HISTORY:  Brianna Merritt is here today repeat examination. She states she continues tamoxifen daily without significant difficulty. She states she discontinued duloxetine, but also stopped venlafaxine as she did  not like the way it made her feel.  Since her last visit she has been more fatigued, but she is working and going to school part-time, as well as managing the household and caring for her 52-year-old.  She states she is sleeping well at night, but does not always feel rested in the  morning.  She reports having an infection about 3 weeks ago consistent with COVID with headache, sinus congestion, nausea and anorexia, but was not tested.  Her appetite has not returned. She denies fevers or chills. She  denies pain. Her weight has decreased 9 pounds over last 3 months. She states she has not had any recent lab work.  She is scheduled to see her gastroenterologist on Monday for routine screening colonoscopy due to her CHEK2 mutation.  She has not had an MRI breast.  REVIEW OF SYSTEMS:  Review of Systems  Constitutional: Positive for appetite change, fatigue and unexpected weight change. Negative for chills and fever.  HENT:   Negative for lump/mass, mouth sores and sore throat.   Respiratory: Negative for cough and shortness of breath.   Cardiovascular: Negative for chest pain and leg swelling.  Gastrointestinal: Negative for abdominal pain, constipation, diarrhea, nausea and vomiting.  Genitourinary: Negative for difficulty urinating, dysuria, frequency and hematuria.   Musculoskeletal: Negative for arthralgias, back pain and myalgias.  Skin: Negative for rash.  Neurological: Negative for dizziness, headaches and seizures.  Hematological: Negative for adenopathy.  Psychiatric/Behavioral: Negative for depression and sleep disturbance. The patient is not nervous/anxious.      VITALS:  Blood pressure (!) 100/57, pulse 66, temperature 98.5 F (36.9 C), temperature source Oral, resp. rate 18, height 5' (1.524 m), weight 141 lb (64 kg), SpO2 99 %.  Wt Readings from Last 3 Encounters:  10/03/20 141 lb (64 kg)  07/04/20 147 lb 11.2 oz (67 kg)  01/11/14 160 lb 14.4 oz (73 kg)    Body mass index is 27.54 kg/m.  Performance status (ECOG): 1 - Symptomatic but completely ambulatory  PHYSICAL EXAM:  Physical Exam Vitals and nursing note reviewed.  Constitutional:      General: She is not in acute distress.    Appearance: Normal appearance. She is not ill-appearing.  HENT:      Mouth/Throat:     Mouth: Mucous membranes are moist.     Pharynx: Oropharynx is clear. No oropharyngeal exudate or posterior oropharyngeal erythema.  Eyes:     General: No scleral icterus.    Extraocular Movements: Extraocular movements intact.     Conjunctiva/sclera: Conjunctivae normal.     Pupils: Pupils are equal, round, and reactive to light.  Cardiovascular:     Rate and Rhythm: Normal rate and regular rhythm.     Heart sounds: Normal heart sounds. No murmur heard. No friction rub. No gallop.   Pulmonary:     Effort: Pulmonary effort is normal. No respiratory distress.     Breath sounds: Normal breath sounds. No stridor. No wheezing, rhonchi or rales.  Chest:  Breasts:     Right: Tenderness (mild, lateral breast since surgery) present. No swelling, bleeding, inverted nipple, mass, nipple discharge, skin change, axillary adenopathy or supraclavicular adenopathy.     Left: Inverted nipple (normal) present. No swelling, bleeding, mass, nipple discharge, skin change, tenderness, axillary adenopathy or supraclavicular adenopathy.    Abdominal:     General: There is no distension.     Palpations: Abdomen is soft. There is no hepatomegaly, splenomegaly or mass.     Tenderness: There  is no abdominal tenderness. There is no guarding.  Musculoskeletal:        General: Normal range of motion.     Cervical back: Normal range of motion and neck supple. No tenderness.     Right lower leg: No edema.     Left lower leg: No edema.  Lymphadenopathy:     Cervical: No cervical adenopathy.     Upper Body:     Right upper body: No supraclavicular or axillary adenopathy.     Left upper body: No supraclavicular or axillary adenopathy.     Lower Body: No right inguinal adenopathy. No left inguinal adenopathy.  Skin:    General: Skin is warm and dry.     Coloration: Skin is not jaundiced.     Findings: No rash.  Neurological:     Mental Status: She is alert and oriented to person, place, and  time.     Cranial Nerves: No cranial nerve deficit.  Psychiatric:        Mood and Affect: Mood normal.        Behavior: Behavior normal.        Thought Content: Thought content normal.    LABS:   CBC Latest Ref Rng & Units 10/03/2020 07/04/2020 06/08/2014  WBC - 4.2 6.4 10.3  Hemoglobin 12.0 - 16.0 13.3 13.5 14.7  Hematocrit 36 - 46 39 40.6 45.4  Platelets 150 - 399 213 267 318   CMP Latest Ref Rng & Units 10/03/2020 07/04/2020 06/08/2014  Glucose 70 - 99 mg/dL - 95 138(H)  BUN 4 - _0 Creatinine 0.5 - 1.1 0.8 0.80 0.77  Sodium 137 - 147 139 140 139  Potassium 3.4 - 5.3 4.3 3.9 3.5(L)  Chloride 99 - 108 108 108 101  CO2 13 - 22 26(A) 21(L) 23  Calcium 8.7 - 10.7 9.0 9.1 9.2  Total Protein 6.5 - 8.1 g/dL - 7.4 7.1  Total Bilirubin 0.3 - 1.2 mg/dL - 0.3 0.3  Alkaline Phos 25 - 125 43 32(L) 84  AST 13 - 35 _1 ALT 7 - 35 _2 TSH was normal at 0.501  No results found for: CEA1 / No results found for: CEA1 No results found for: PSA1 No results found for: VQM086 No results found for: PYP950  No results found for: TOTALPROTELP, ALBUMINELP, A1GS, A2GS, BETS, BETA2SER, GAMS, MSPIKE, SPEI No results found for: TIBC, FERRITIN, IRONPCTSAT No results found for: LDH  STUDIES:  No results found.    HISTORY:   Past Medical History:  Diagnosis Date  . Anxiety   . Anxiety   . DDD (degenerative disc disease)   . Migraine   . Seizures ([place])     Past Surgical History:  Procedure Laterality Date  . CESAREAN SECTION    . CHOLECYSTECTOMY    . ENDOMETRIAL ABLATION    . TUBAL LIGATION      History reviewed. No pertinent family history.  Social History:  reports that she has been smoking cigarettes. She has been smoking about 1.50 packs per day. She has quit using smokeless tobacco. She reports that she does not drink alcohol and does not use drugs.The patient is alone today.  Allergies:  Allergies  Allergen Reactions  . Ketorolac Itching  . Ketorolac  Tromethamine Itching  . Sumatriptan Other (See Comments) and Anaphylaxis    Stroke risk  . Meperidine Hcl     Combative  . Meperidine Other (See Comments)  combative Other reaction(s): Other (See Comments) combative    Current Medications: Current Outpatient Medications  Medication Sig Dispense Refill  . albuterol (PROVENTIL HFA;VENTOLIN HFA) 108 (90 BASE) MCG/ACT inhaler Inhale 2 puffs into the lungs every 6 (six) hours as needed for wheezing or shortness of breath.     . busPIRone (BUSPAR) 30 MG tablet Take 30 mg by mouth in the morning, at noon, and at bedtime.    . clonazePAM (KLONOPIN) 1 MG tablet Take 1 mg by mouth as directed. Take 0.5 tablet four times a day    . hydrOXYzine (ATARAX/VISTARIL) 50 MG tablet Take 1-1.5 tablets by mouth at bedtime as needed.    . tamoxifen (NOLVADEX) 20 MG tablet Take 1 tablet by mouth once daily 90 tablet 3   No current facility-administered medications for this visit.     ASSESSMENT & PLAN:   Assessment:  1.  Stage IA (T1b N0 M0) hormone receptor positive breast cancer, treated with lumpectomy and adjuvant radiation.  She continues adjuvant hormonal therapy with tamoxifen daily and is tolerating this well except for fatigue. She remains without evidence of recurrence. 2.  CHEK2 heterozygous gene mutation.  This is a high risk gene related to an increased risk of breast cancer with her cancer risk being 20-31% when compared to the risk for the general population of 10.2%.  Her risk for developing a secondary primary within 10 years is up to 29%.  This gene is also associated with a possible elevated risk for colorectal cancer.  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  We have recommended a repeat colonoscopy and she sees her gastroenterologist next week. 3.  Seizure disorder; she has been without an episode in about 4 years. 4.  Tobacco abuse.  I discussed the importance of the cessation  of smoking.  5.  Depression/anxiety, she was given venlafaxine in September to help her hot flashes and depression and told to discontinue duloxetine.  She is no longer taking the duloxetine, but also discontinued venlafaxine, as she did not like the way it made her feel.  She continues clonazepam and buspirone. 6.  Fatigue, this may be increased due to tamoxifen, but she has a very busy lifestyle. Evaluation with CBC, comprehensive metabolic panel and TSH was added, but did not reveal a specific etiology for her fatigue. I think this is most likely due to recent infection, which was consistent with COVID.  Plan:     I will contact her with the results of her TSH.  She knows to continue tamoxifen daily for least a total of 5 years.  We will plan to see her back in 3 months for re-examination.  The patient understands the plans discussed today and is in agreement with them.  She knows to contact our office if she develops concerns prior to her next appointment.   I provided 20 minutes of face-to-face time during this this encounter and > 50% was spent counseling as documented under my assessment and plan.      Marvia Pickles, PA-C

## 2020-10-03 NOTE — Telephone Encounter (Signed)
Per 2/11 los next appt sched and given to patient 

## 2020-10-06 ENCOUNTER — Encounter: Payer: Self-pay | Admitting: Gastroenterology

## 2020-10-06 ENCOUNTER — Ambulatory Visit (INDEPENDENT_AMBULATORY_CARE_PROVIDER_SITE_OTHER): Payer: Medicare Other | Admitting: Gastroenterology

## 2020-10-06 DIAGNOSIS — Z800 Family history of malignant neoplasm of digestive organs: Secondary | ICD-10-CM

## 2020-10-06 DIAGNOSIS — K449 Diaphragmatic hernia without obstruction or gangrene: Secondary | ICD-10-CM

## 2020-10-06 DIAGNOSIS — C50919 Malignant neoplasm of unspecified site of unspecified female breast: Secondary | ICD-10-CM

## 2020-10-06 DIAGNOSIS — Z853 Personal history of malignant neoplasm of breast: Secondary | ICD-10-CM

## 2020-10-06 DIAGNOSIS — K219 Gastro-esophageal reflux disease without esophagitis: Secondary | ICD-10-CM

## 2020-10-06 DIAGNOSIS — R1310 Dysphagia, unspecified: Secondary | ICD-10-CM | POA: Diagnosis not present

## 2020-10-06 DIAGNOSIS — K581 Irritable bowel syndrome with constipation: Secondary | ICD-10-CM

## 2020-10-06 MED ORDER — LINACLOTIDE 72 MCG PO CAPS: 72.0000 ug | ORAL_CAPSULE | Freq: Every day | ORAL | 11 refills | Status: DC

## 2020-10-06 MED ORDER — OMEPRAZOLE 20 MG PO CPDR: 20.0000 mg | DELAYED_RELEASE_CAPSULE | Freq: Every day | ORAL | 3 refills | Status: DC

## 2020-10-06 NOTE — Progress Notes (Signed)
Chief Complaint: for GI eval  Referring Provider:  Jeanie Sewer, NP      ASSESSMENT AND PLAN;   #1. FH colon cancer (GM)  #2. Personal H/O Breast Ca. 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 No Lynch.  #3. IBS-C  #4. GERD with HH with rare dysphagia (Ramen noodles)   Plan: -EGD with dil/Colon with miralax  -Omeprazole 49m po QD #30, 11 refills -Minimize nonsteroidals. -Trial of Linzess 75 mcg PO QD. samples given. -Stop smoking.  Discussed risks & benefits. Risks including rare perforation req laparotomy, bleeding after bx/polypectomy req blood transfusion, rarely missing neoplasms, risks of anesthesia/sedation. Benefits outweigh the risks. Patient agrees to proceed. All the questions were answered. Consent forms given for review.  HPI:    Brianna AKaterin Negreteis a 45y.o. female  Sent to GI clinic by Dr. MHinton Raofor GI evaluation Dx with stage Ia invasive ductal breast cancer in April 2021 s/p lumpectomy/XRT. 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  She had colonoscopy in 2004 which was neg per patient.  C/O longstanding constipation x since childhood, mom had her on mineral oil.  She would have BMs 1/2 weeks, with pellet-like stools, lower abdominal bloating, abdominal discomfort which gets better with defecation.  She would have better bowel movements during menstrual periods.  She has tried multiple medications including MiraLAX, MOM without any significant benefit.  Her husband is on Linzess.  She would like to try that.  No melena or hematochezia.  He does complain of occasional epigastric discomfort, occasional heartburn, and occasional problems with dysphagia mainly to Ramen noodles.   She had EGD done in 2004 which showed hiatal hernia, distal esophagitis and healed gastric ulcer.  She does use occasional nonsteroidals.  She continues to smoke despite medical  advice.  Had blood work including CBC, CMP, TSH at cancer center, [facility]   Past GI EGD 2004: HH, esophagitis, gastric ulcer [facility]  Mom - UC with colostomy Past Medical History:  Diagnosis Date  . Anxiety   . Anxiety   . Breast cancer ([facility] 2021  . DDD (degenerative disc disease)   . Migraine   . [place]iz[place]     Past Surgical History:  Procedure Laterality Date  . BREAST LUMPECTOMY    . CESAREAN SECTION    . CHOLECYSTECTOMY    . ENDOMETRIAL ABLATION    . TUBAL LIGATION      Family History  Problem Relation Age of Onset  . Colitis Mother   . Colon cancer Paternal Uncle   . Diabetes Paternal Grandmother     Social History   Tobacco Use  . Smoking status: Current Every Day Smoker    Packs/day: 1.50    Types: Cigarettes  . Smokeless tobacco: Former [employer] developer . Tobacco comment: also vape at times  Vaping Use  . Vaping Use: Every day  Substance Use Topics  . Alcohol use: No  . Drug use: No    Current Outpatient Medications  Medication Sig Dispense Refill  . albuterol (PROVENTIL HFA;VENTOLIN HFA) 108 (90 BASE) MCG/ACT inhaler Inhale 2 puffs into the lungs every 6 (six) hours as needed for wheezing or shortness of breath.     . busPIRone (BUSPAR) 30 MG tablet Take 30 mg by mouth in the morning, at noon, and at bedtime.    . clonazePAM (KLONOPIN) 1 MG tablet Take 1 mg by  mouth as directed. Take 0.5 tablet four times a day    . hydrOXYzine (ATARAX/VISTARIL) 50 MG tablet Take 1-1.5 tablets by mouth at bedtime as needed.    . tamoxifen (NOLVADEX) 20 MG tablet Take 1 tablet by mouth once daily 90 tablet 3   No current facility-administered medications for this visit.    Allergies  Allergen Reactions  . Ketorolac Itching  . Ketorolac Tromethamine Itching  . Sumatriptan Other (See Comments) and Anaphylaxis    Stroke risk  . Meperidine Hcl     Combative  . Meperidine Other (See Comments)    combative Other  reaction(s): Other (See Comments) combative    Review of Systems:  Constitutional: Denies fever, chills, diaphoresis, appetite change and fatigue.  HEENT: Denies photophobia, eye pain, redness, hearing loss, ear pain, congestion, sore throat, rhinorrhea, sneezing, mouth sores, neck pain, neck stiffness and tinnitus.   Respiratory: Denies SOB, DOE, cough, chest tightness,  and wheezing.   Cardiovascular: Denies chest pain, palpitations and leg swelling.  Genitourinary: Denies dysuria, urgency, frequency, hematuria, flank pain and difficulty urinating.  Musculoskeletal: Denies myalgias, back pain, joint swelling, arthralgias and gait problem.  Skin: No rash.  Neurological: Denies dizziness, seizures, syncope, weakness, light-headedness, numbness and headaches.  Hematological: Denies adenopathy. Easy bruising, personal or family bleeding history  Psychiatric/Behavioral: No anxiety or depression     Physical Exam:    There were no vitals taken for this visit. Wt Readings from Last 3 Encounters:  10/03/20 141 lb (64 kg)  07/04/20 147 lb 11.2 oz (67 kg)  01/11/14 160 lb 14.4 oz (73 kg)   Constitutional:  Well-developed, in no acute distress. Psychiatric: Normal mood and affect. Behavior is normal. HEENT: Pupils normal.  Conjunctivae are normal. No scleral icterus. Neck supple.  Cardiovascular: Normal rate, regular rhythm. No edema Pulmonary/chest: Effort normal and breath sounds normal. No wheezing, rales or rhonchi. Abdominal: Soft, nondistended. Nontender. Bowel sounds active throughout. There are no masses palpable. No hepatomegaly. Rectal: Deferred Neurological: Alert and oriented to person place and time. Skin: Skin is warm and dry. No rashes noted.  Data Reviewed: I have personally reviewed following labs and imaging studies  CBC: CBC Latest Ref Rng & Units 10/03/2020 07/04/2020 06/08/2014  WBC - 4.2 6.4 10.3  Hemoglobin 12.0 - 16.0 13.3 13.5 14.7  Hematocrit 36 - 46 39 40.6  45.4  Platelets 150 - 399 213 267 318    CMP: CMP Latest Ref Rng & Units 10/03/2020 07/04/2020 06/08/2014  Glucose 70 - 99 mg/dL - 95 138(H)  BUN 4 - 21 10 12 6   Creatinine 0.5 - 1.1 0.8 0.80 0.77  Sodium 137 - 147 139 140 139  Potassium 3.4 - 5.3 4.3 3.9 3.5(L)  Chloride 99 - 108 108 108 101  CO2 13 - 22 26(A) 21(L) 23  Calcium 8.7 - 10.7 9.0 9.1 9.2  Total Protein 6.5 - 8.1 g/dL - 7.4 7.1  Total Bilirubin 0.3 - 1.2 mg/dL - 0.3 0.3  Alkaline Phos 25 - 125 43 32(L) 84  AST 13 - 35 18 19 14   ALT 7 - 35 19 15 15     GFR: Estimated Creatinine Clearance: 74.9 mL/min (by C-G formula based on SCr of 0.8 mg/dL). Liver Function Tests: Recent Labs  Lab 10/03/20 0000  AST 18  ALT 19  ALKPHOS 43  ALBUMIN 4.2   Thyroid Function Tests: Recent Labs    10/03/20 1040  TSH 0.501   Anemia Panel: No results for input(s): VITAMINB12, FOLATE, FERRITIN,  TIBC, IRON, RETICCTPCT in the last 72 hours.  No results found for this or any previous visit (from the past 240 hour(s)).    Radiology Studies: No results found.    Carmell Austria, MD 10/06/2020, 10:22 AM  Cc: Jeanie Sewer, NP

## 2020-10-06 NOTE — Patient Instructions (Addendum)
If you are age 45 or older, your body mass index should be between 23-30. Your There is no height or weight on file to calculate BMI. If this is out of the aforementioned range listed, please consider follow up with your Primary Care Provider.  If you are age 61 or younger, your body mass index should be between 19-25. Your There is no height or weight on file to calculate BMI. If this is out of the aformentioned range listed, please consider follow up with your Primary Care Provider.   You have been scheduled for a colonoscopy. Please follow written instructions given to you at your visit today.  Please pick up your prep supplies at the pharmacy within the next 1-3 days. If you use inhalers (even only as needed), please bring them with you on the day of your procedure.  You have been scheduled for an endoscopy. Please follow written instructions given to you at your visit today. If you use inhalers (even only as needed), please bring them with you on the day of your procedure.   We have sent the following medications to your pharmacy for you to pick up at your convenience: Linzess Omeprazole  Two days before your procedure: Mix 3 packs (or capfuls) of Miralax in 48 ounces of clear liquid and drink at 6pm.  Thank you,  Dr. Jackquline Denmark

## 2020-10-07 ENCOUNTER — Other Ambulatory Visit: Payer: Self-pay | Admitting: Gastroenterology

## 2020-10-08 ENCOUNTER — Telehealth: Payer: Self-pay | Admitting: *Deleted

## 2020-10-08 LAB — SARS CORONAVIRUS 2 (TAT 6-24 HRS): SARS Coronavirus 2: POSITIVE — AB

## 2020-10-08 NOTE — Telephone Encounter (Signed)
PT was COVID positive found after testing for Procedure tomorrow 10/09/20. PT is asymptomatic. Told to quarantine for 5 days.  Rescheduled patient for 10/21/20.

## 2020-10-08 NOTE — Telephone Encounter (Signed)
Thx for letting me know RG

## 2020-10-09 ENCOUNTER — Telehealth: Payer: Self-pay

## 2020-10-09 ENCOUNTER — Encounter: Payer: Medicare Other | Admitting: Gastroenterology

## 2020-10-09 NOTE — Telephone Encounter (Signed)
Called to discuss with patient about COVID-19 symptoms and the use of one of the available treatments for those with mild to moderate Covid symptoms and at a high risk of hospitalization.  Pt appears to qualify for outpatient treatment due to co-morbid conditions and/or a member of an at-risk group in accordance with the FDA Emergency Use Authorization.    Symptom onset: None per pt. Vaccinated: No Booster? No Immunocompromised? Yes Qualifiers: Breast cancer.  Pt. Is asymptomatic.  Brianna Merritt

## 2020-10-20 ENCOUNTER — Telehealth: Payer: Self-pay | Admitting: Gastroenterology

## 2020-10-20 NOTE — Telephone Encounter (Signed)
Pt is requesting a call back from a nurse to discuss which dulcolax she needs to use for her prep.

## 2020-10-21 ENCOUNTER — Other Ambulatory Visit: Payer: Self-pay

## 2020-10-21 ENCOUNTER — Encounter: Payer: Self-pay | Admitting: Gastroenterology

## 2020-10-21 ENCOUNTER — Ambulatory Visit (AMBULATORY_SURGERY_CENTER): Payer: Medicare Other | Admitting: Gastroenterology

## 2020-10-21 VITALS — BP 139/74 | HR 81 | Temp 98.2°F | Resp 20 | Ht 60.0 in | Wt 139.0 lb

## 2020-10-21 DIAGNOSIS — K319 Disease of stomach and duodenum, unspecified: Secondary | ICD-10-CM | POA: Diagnosis not present

## 2020-10-21 DIAGNOSIS — Z1509 Genetic susceptibility to other malignant neoplasm: Secondary | ICD-10-CM

## 2020-10-21 DIAGNOSIS — D125 Benign neoplasm of sigmoid colon: Secondary | ICD-10-CM | POA: Diagnosis not present

## 2020-10-21 DIAGNOSIS — K2970 Gastritis, unspecified, without bleeding: Secondary | ICD-10-CM | POA: Diagnosis not present

## 2020-10-21 DIAGNOSIS — D128 Benign neoplasm of rectum: Secondary | ICD-10-CM

## 2020-10-21 DIAGNOSIS — R1310 Dysphagia, unspecified: Secondary | ICD-10-CM | POA: Diagnosis not present

## 2020-10-21 DIAGNOSIS — Z800 Family history of malignant neoplasm of digestive organs: Secondary | ICD-10-CM | POA: Diagnosis not present

## 2020-10-21 DIAGNOSIS — K621 Rectal polyp: Secondary | ICD-10-CM

## 2020-10-21 DIAGNOSIS — K3189 Other diseases of stomach and duodenum: Secondary | ICD-10-CM | POA: Diagnosis not present

## 2020-10-21 DIAGNOSIS — D374 Neoplasm of uncertain behavior of colon: Secondary | ICD-10-CM | POA: Diagnosis not present

## 2020-10-21 MED ORDER — SODIUM CHLORIDE 0.9 % IV SOLN: 500.0000 mL | Freq: Once | INTRAVENOUS | Status: DC

## 2020-10-21 NOTE — Op Note (Signed)
[place] Patient Name: Brianna Merritt Procedure Date: 10/21/2020 11:30 AM MRN: [id number] Endoscopist: Jackquline Denmark , MD Age: 45 Referring MD:  Date of Birth: [birth year] Gender: Female Account #: [id number] Procedure:                Upper GI endoscopy Indications:              Dysphagia Medicines:                Monitored Anesthesia Care Procedure:                Pre-Anesthesia Assessment:                           - Prior to the procedure, a History and Physical                            was performed, and patient medications and                            allergies were reviewed. The patient's tolerance of                            previous anesthesia was also reviewed. The risks                            and benefits of the procedure and the sedation                            options and risks were discussed with the patient.                            All questions were answered, and informed consent                            was obtained. Prior Anticoagulants: The patient has                            taken no previous anticoagulant or antiplatelet                            agents. ASA Grade Assessment: II - A patient with                            mild systemic disease. After reviewing the risks                            and benefits, the patient was deemed in                            satisfactory condition to undergo the procedure.                           After obtaining informed consent, the endoscope was  passed under direct vision. Throughout the                            procedure, the patient's blood pressure, pulse, and                            oxygen saturations were monitored continuously. The                            Endoscope was introduced through the mouth, and                            advanced to the second part of duodenum. The upper                            GI endoscopy was accomplished without  difficulty.                            The patient tolerated the procedure well. Scope In: Scope Out: Findings:                 The esophagus was normal. A small area measuring 6                            mm, likely a small inlet patch was noted in the                            proximal esophagus. Multiple biopsies were obtained                            and sent for histology. Z-line was well-defined at                            35 cm. Multiple biopsies were also obtained from                            proximal, mid and distal esophagus to rule out                            eosinophilic esophagitis. Due to dysphagia, it was                            decided, however, to proceed with dilation of the                            entire esophagus. The scope was withdrawn. Dilation                            was performed with a Maloney dilator with mild                            resistance at 52 Fr.  A small transient hiatal hernia was present.                           Localized mild inflammation characterized by                            erythema was found in the gastric antrum. Biopsies                            were taken with a cold forceps for histology.                           The examined duodenum was normal. Biopsies for                            histology were taken with a cold forceps for                            evaluation of celiac disease. Complications:            No immediate complications. Estimated Blood Loss:     Estimated blood loss: none. Impression:               - Small hiatal hernia.                           - Gastritis. Biopsied.                           - S/P esophageal dilatation. Recommendation:           - Patient has a contact number available for                            emergencies. The signs and symptoms of potential                            delayed complications were discussed with the                             patient. Return to normal activities tomorrow.                            Written discharge instructions were provided to the                            patient.                           - Post dilatation diet.                           - Continue present medications.                           - Await pathology results.                           -  The findings and recommendations were discussed                            with the patient's family. Jackquline Denmark, MD 10/21/2020 12:17:05 PM This report has been signed electronically.

## 2020-10-21 NOTE — Patient Instructions (Signed)
Information on polyps, hemorrhoids and strictures given to you today.  Await pathology results.  Dilation diet today.  See handout for details.  Continue present medications.  YOU HAD AN ENDOSCOPIC PROCEDURE TODAY AT [facility]:   Refer to the procedure report that was given to you for any specific questions about what was found during the examination.  If the procedure report does not answer your questions, please call your gastroenterologist to clarify.  If you requested that your care partner not be given the details of your procedure findings, then the procedure report has been included in a sealed envelope for you to review at your convenience later.  YOU SHOULD EXPECT: Some feelings of bloating in the abdomen. Passage of more gas than usual.  Walking can help get rid of the air that was put into your GI tract during the procedure and reduce the bloating. If you had a lower endoscopy (such as a colonoscopy or flexible sigmoidoscopy) you may notice spotting of blood in your stool or on the toilet paper. If you underwent a bowel prep for your procedure, you may not have a normal bowel movement for a few days.  Please Note:  You might notice some irritation and congestion in your nose or some drainage.  This is from the oxygen used during your procedure.  There is no need for concern and it should clear up in a day or so.  SYMPTOMS TO REPORT IMMEDIATELY:   Following lower endoscopy (colonoscopy or flexible sigmoidoscopy):  Excessive amounts of blood in the stool  Significant tenderness or worsening of abdominal pains  Swelling of the abdomen that is new, acute  Fever of 100F or higher   Following upper endoscopy (EGD)  Vomiting of blood or coffee ground material  New chest pain or pain under the shoulder blades  Painful or persistently difficult swallowing  New shortness of breath  Fever of 100F or higher  Black, tarry-looking stools  For urgent or emergent  issues, a gastroenterologist can be reached at any hour by calling [phone]. Do not use MyChart messaging for urgent concerns.    DIET:  We do recommend a small meal at first, but then you may proceed to your regular diet.  Drink plenty of fluids but you should avoid alcoholic beverages for 24 hours.  ACTIVITY:  You should plan to take it easy for the rest of today and you should NOT DRIVE or use heavy machinery until tomorrow (because of the sedation medicines used during the test).    FOLLOW UP: Our staff will call the number listed on your records 48-72 hours following your procedure to check on you and address any questions or concerns that you may have regarding the information given to you following your procedure. If we do not reach you, we will leave a message.  We will attempt to reach you two times.  During this call, we will ask if you have developed any symptoms of COVID 19. If you develop any symptoms (ie: fever, flu-like symptoms, shortness of breath, cough etc.) before then, please call [phone].  If you test positive for Covid 19 in the 2 weeks post procedure, please call and report this information to [place].    If any biopsies were taken you will be contacted by phone or by letter within the next 1-3 weeks.  Please call us at [phone] if you have not heard about the biopsies in 3 weeks.    SIGNATURES/CONFIDENTIALITY: You and/or  your care partner have signed paperwork which will be entered into your electronic medical record.  These signatures attest to the fact that that the information above on your After Visit Summary has been reviewed and is understood.  Full responsibility of the confidentiality of this discharge information lies with you and/or your care-partner.

## 2020-10-21 NOTE — Op Note (Signed)
[place] Patient Name: Brianna Merritt Procedure Date: 10/21/2020 11:50 AM MRN: [id number] Endoscopist: Jackquline Denmark , MD Age: 45 Referring MD:  Date of Birth: [birth year] Gender: Female Account #: [id number] Procedure:                Colonoscopy Indications:              Screening in patient at increased risk: Family                            history of 1st-degree relative with colorectal                            cancer. Pos CHEK mutation. Medicines:                Monitored Anesthesia Care Procedure:                Pre-Anesthesia Assessment:                           - Prior to the procedure, a History and Physical                            was performed, and patient medications and                            allergies were reviewed. The patient's tolerance of                            previous anesthesia was also reviewed. The risks                            and benefits of the procedure and the sedation                            options and risks were discussed with the patient.                            All questions were answered, and informed consent                            was obtained. Prior Anticoagulants: The patient has                            taken no previous anticoagulant or antiplatelet                            agents. ASA Grade Assessment: II - A patient with                            mild systemic disease. After reviewing the risks                            and benefits, the patient was deemed in  satisfactory condition to undergo the procedure.                           After obtaining informed consent, the colonoscope                            was passed under direct vision. Throughout the                            procedure, the patient's blood pressure, pulse, and                            oxygen saturations were monitored continuously. The                            Olympus [id number]-[id number] (#[id number])  Colonoscope was                            introduced through the anus and advanced to the 2                            cm into the ileum. The colonoscopy was performed                            without difficulty. The patient tolerated the                            procedure well. The quality of the bowel                            preparation was good. The terminal ileum, ileocecal                            valve, appendiceal orifice, and rectum were                            photographed. Scope In: 11:52:04 AM Scope Out: 12:11:42 PM Scope Withdrawal Time: 0 hours 15 minutes 35 seconds  Total Procedure Duration: 0 hours 19 minutes 38 seconds  Findings:                 A 10 mm polyp was found in the distal sigmoid                            colon. The polyp was semi-pedunculated. The polyp                            was removed with a hot snare. Resection and                            retrieval were complete.                           A 4 mm polyp was found in the rectum. The polyp was  sessile. The polyp was removed with a cold snare.                            Resection and retrieval were complete.                           Non-bleeding internal hemorrhoids were found during                            retroflexion. The hemorrhoids were small.                           The terminal ileum appeared normal.                           The exam was otherwise without abnormality on                            direct and retroflexion views. The colon was highly                            redundant. Complications:            No immediate complications. Estimated Blood Loss:     Estimated blood loss: none. Impression:               - One 10 mm polyp in the distal sigmoid colon,                            removed with a hot snare. Resected and retrieved.                           - One 4 mm polyp in the rectum, removed with a cold                            snare.  Resected and retrieved.                           - Otherwise normal colonoscopy to TI. Recommendation:           - Patient has a contact number available for                            emergencies. The signs and symptoms of potential                            delayed complications were discussed with the                            patient. Return to normal activities tomorrow.                            Written discharge instructions were provided to the  patient.                           - Resume previous diet.                           - Continue present medications.                           - Await pathology results.                           - Repeat colonoscopy for surveillance based on                            pathology results. Jackquline Denmark, MD 10/21/2020 12:20:52 PM This report has been signed electronically.

## 2020-10-21 NOTE — Progress Notes (Signed)
C.W. vital signs. 

## 2020-10-21 NOTE — Progress Notes (Signed)
Called to room to assist during endoscopic procedure.  Patient ID and intended procedure confirmed with present staff. Received instructions for my participation in the procedure from the performing physician.  

## 2020-10-21 NOTE — Progress Notes (Signed)
Pt's states no medical or surgical changes since previsit or office visit. 

## 2020-10-21 NOTE — Telephone Encounter (Signed)
Contacted the patient and her husband answered the phone, the patient is in procedure at the [facility] right now and did figure out her question by referring to her instructions.

## 2020-10-21 NOTE — Progress Notes (Signed)
Report to PACU, RN, vss, BBS= Clear.  

## 2020-10-23 ENCOUNTER — Telehealth: Payer: Self-pay | Admitting: *Deleted

## 2020-10-23 NOTE — Telephone Encounter (Signed)
  Follow up Call-  Call back number 10/21/2020  Post procedure Call Back phone  # [phone]  Permission to leave phone message Yes  Some recent data might be hidden     Patient questions:  Do you have a fever, pain , or abdominal swelling? No. Pain Score  0 *  Have you tolerated food without any problems? Yes.    Have you been able to return to your normal activities? Yes.    Do you have any questions about your discharge instructions: Diet   No. Medications  No. Follow up visit  No.  Do you have questions or concerns about your Care? No.  Actions: * If pain score is 4 or above: No action needed, pain <4.  1. Have you developed a fever since your procedure? no  2.   Have you had an respiratory symptoms (SOB or cough) since your procedure? no  3.   Have you tested positive for COVID 19 since your procedure no  4.   Have you had any family members/close contacts diagnosed with the COVID 19 since your procedure?  no   If yes to any of these questions please route to Joylene John, RN and Joella Prince, RN

## 2020-11-12 ENCOUNTER — Other Ambulatory Visit: Payer: Self-pay | Admitting: Surgery

## 2020-11-12 ENCOUNTER — Encounter: Payer: Self-pay | Admitting: Gastroenterology

## 2020-11-12 DIAGNOSIS — Z170 Estrogen receptor positive status [ER+]: Secondary | ICD-10-CM

## 2020-11-12 DIAGNOSIS — C50911 Malignant neoplasm of unspecified site of right female breast: Secondary | ICD-10-CM

## 2020-12-02 ENCOUNTER — Ambulatory Visit
Admission: RE | Admit: 2020-12-02 | Discharge: 2020-12-02 | Disposition: A | Discharge status: Home / Self Care | Payer: Medicare Other | Source: Ambulatory Visit | Attending: Surgery | Admitting: Surgery

## 2020-12-02 DIAGNOSIS — C50911 Malignant neoplasm of unspecified site of right female breast: Secondary | ICD-10-CM

## 2020-12-02 DIAGNOSIS — Z170 Estrogen receptor positive status [ER+]: Secondary | ICD-10-CM

## 2020-12-02 MED ORDER — GADOBUTROL 1 MMOL/ML IV SOLN
6.0000 mL | Freq: Once | INTRAVENOUS | Status: AC | PRN
  Administered 2020-12-02: 6 mL via INTRAVENOUS

## 2020-12-04 ENCOUNTER — Other Ambulatory Visit: Payer: Self-pay | Admitting: Surgery

## 2020-12-04 DIAGNOSIS — R9389 Abnormal findings on diagnostic imaging of other specified body structures: Secondary | ICD-10-CM

## 2020-12-11 ENCOUNTER — Ambulatory Visit
Admission: RE | Admit: 2020-12-11 | Discharge: 2020-12-11 | Disposition: A | Discharge status: Home / Self Care | Payer: Medicare Other | Source: Ambulatory Visit | Attending: Surgery | Admitting: Surgery

## 2020-12-11 ENCOUNTER — Other Ambulatory Visit: Payer: Self-pay

## 2020-12-11 ENCOUNTER — Other Ambulatory Visit: Payer: Self-pay | Admitting: Diagnostic Radiology

## 2020-12-11 DIAGNOSIS — R9389 Abnormal findings on diagnostic imaging of other specified body structures: Secondary | ICD-10-CM

## 2020-12-11 MED ORDER — GADOBUTROL 1 MMOL/ML IV SOLN: 5.0000 mL | Freq: Once | INTRAVENOUS | Status: DC | PRN

## 2020-12-12 ENCOUNTER — Other Ambulatory Visit: Payer: Self-pay | Admitting: Surgery

## 2020-12-12 DIAGNOSIS — R921 Mammographic calcification found on diagnostic imaging of breast: Secondary | ICD-10-CM

## 2020-12-24 ENCOUNTER — Other Ambulatory Visit: Payer: Self-pay

## 2020-12-24 ENCOUNTER — Ambulatory Visit
Admission: RE | Admit: 2020-12-24 | Discharge: 2020-12-24 | Disposition: A | Discharge status: Home / Self Care | Payer: Medicare (Managed Care) | Source: Ambulatory Visit | Attending: Surgery | Admitting: Surgery

## 2020-12-24 ENCOUNTER — Ambulatory Visit
Admission: RE | Admit: 2020-12-24 | Discharge: 2020-12-24 | Disposition: A | Discharge status: Home / Self Care | Payer: Medicare Other | Source: Ambulatory Visit | Attending: Surgery | Admitting: Surgery

## 2020-12-24 ENCOUNTER — Other Ambulatory Visit: Payer: Self-pay | Admitting: Surgery

## 2020-12-24 DIAGNOSIS — R921 Mammographic calcification found on diagnostic imaging of breast: Secondary | ICD-10-CM

## 2020-12-26 ENCOUNTER — Other Ambulatory Visit: Payer: Self-pay | Admitting: Surgery

## 2020-12-26 DIAGNOSIS — D242 Benign neoplasm of left breast: Secondary | ICD-10-CM

## 2020-12-31 ENCOUNTER — Telehealth: Payer: Self-pay | Admitting: Hematology and Oncology

## 2020-12-31 ENCOUNTER — Encounter: Payer: Self-pay | Admitting: Hematology and Oncology

## 2020-12-31 ENCOUNTER — Inpatient Hospital Stay: Payer: Medicare (Managed Care) | Attending: Oncology | Admitting: Hematology and Oncology

## 2020-12-31 ENCOUNTER — Other Ambulatory Visit: Payer: Self-pay

## 2020-12-31 VITALS — BP 109/59 | HR 98 | Temp 98.7°F | Resp 18 | Ht 60.0 in | Wt 135.2 lb

## 2020-12-31 DIAGNOSIS — Z170 Estrogen receptor positive status [ER+]: Secondary | ICD-10-CM

## 2020-12-31 DIAGNOSIS — C50411 Malignant neoplasm of upper-outer quadrant of right female breast: Secondary | ICD-10-CM | POA: Diagnosis not present

## 2020-12-31 NOTE — Telephone Encounter (Signed)
Per 5/11 los next appt scheduled and given to patient 

## 2020-12-31 NOTE — Progress Notes (Signed)
[facility]  [address] [place],  [place]  [address] [phone]  Clinic Day:  12/31/2020  Referring physician: Jeanie Sewer, NP   CHIEF COMPLAINT:  CC:  Stage I A hormone sensitive breast cancer  Current Treatment:   Tamoxifen 20 mg daily   HISTORY OF PRESENT ILLNESS:  Brianna Merritt is a 45 y.o. 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  Annual mammography in March revealed possible asymmetries within the bilateral breasts. Bilateral diagnostic mammogram revealed a heterogeneous hypoechoic irregular mass with microlobulated and angular margins at the 10 o'clock position 6 cm from the nipple measuring 7 x 9 x 6 mm in the right breast.  Ultrasound of the right axilla was negative.  No evidence of malignancy was seen in the left breast.  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  The invasive carcinoma involved all cores measuring 8 mm in maximal extent.  Estrogen receptor was 95% positive and progesterone receptor was 100% positive.  HER2 was equivocal 2+, but HER2 by FISH was negative.    Due to her personal and family history of breast cancer she underwent genetic testing with the [employer] myRisk Hereditary Cancer Panel.  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  This particular mutation is associated with an increased lifetime risk of breast cancer of 20 to 31%, with the risk of a 2nd breast cancer primary within 10 years of her 1st breast cancer up to 29%.  There is insufficient evidence to recommend prophylactic mastectomy with a CHEK2 mutation. There is also possibly an elevated risk of colorectal cancer associated with a CHEK2 mutation, which cannot be quantified. There was also a variant  of uncertain significance (VUS) found in the MSH2.  She was treated with lumpectomy and right axillary sentinel lymph node biopsy in April 2021.  Final pathology revealed an 8 mm, grade 2,  invasive ductal carcinoma with ductal carcinoma in situ, intermediate nuclear grade.  Margins were free of neoplasm.  One sentinel lymph node was negative for metastatic carcinoma (0/1).  She was premenopausal, and had irregular menses, so was taking Lo Loestrin 1 mg/10 mcg for about 5 months prior to her diagnosis.  This was discontinued following her diagnosis.   EndoPredict revealed a 12 gene molecular score of 7.2 with an EPclin score of 3.0, which is considered low risk.  Her risk for distant recurrence within 10 years is 7.4% with adjuvant radiation and endocrine therapy alone, with an absolute chemotherapy benefit of 1.9%.  Her risk for distant recurrence in 5-15 years is 5.8%.  Adjuvant chemotherapy was not recommended.  She completed adjuvant radiation in July. She was placed on adjuvant hormonal therapy with tamoxifen 20 mg daily in September. She was on duloxetine at that time and due to the interaction with tamoxifen, she was switched to venlafaxine. Her last colonoscopy and EGD were in 2004, at which time colonoscopy was unremarkable. She had GERD with a hiatal hernia, mild distal esophagitis and healed gastric ulcer.  We recommended repeat colonoscopy due to the CHEK2 mutation.   At her last visit in February, she had stopped venlafaxine as she did not like the way it made her feel. She reported fatigue that she felt was increased due to tamoxifen. Evaluation with CBC, comprehensive  metabolic panel and TSH did not reveal a specific etiology for her fatigue. I felt her fatigue was most likely due to recent respiratory infection, which was consistent with COVID, although she did not seek medical care.  She was scheduled to see Dr. Lyndel Merritt after our visit.  INTERVAL HISTORY:  Brianna Merritt is here today repeat  examination. She states she continues tamoxifen daily without difficulty, except for continued fatigue. Since her last visit, she had an abnormal mammogram that revealed calcifications within the bilateral breasts. On MRI breast these areas were suspicious.  There was no corresponding mass on ultrasound.   MRI guided biopsy of the right breast upper inner quadrant and left breast upper inner quadrant revealed fibroadenomatoid changes and fibrocystic with usual ductal hyperplasia, fibroadenomatoid changes respectively.  Stereotactic biopsy of 2 areas the right lower outer quadrant was also recommended.  Pathology of the posterior depth revealed diminutive complex sclerosing lesion with calcifications. Pathology of the anterior depth revealed benign glands with calcifications. She was scheduled for a stereotactic biopsy of 2 areas of the left breast upper outer quadrant on May 16th, but she saw Dr. Lilia Merritt today.  Due to the need for multiple biopsies and increased risk for breast cancer with her CHEK2 mutation, they have discussed bilateral mastectomies and immediate breast reconstruction and he referred her to Dr. Marla Merritt.   She reports mild tenderness of the breast with bruising of the right breast since biopsies.  She denies fevers or chills.  Her appetite is good.  Her weight has decreased 6 lb in 3 months. EGD and colonoscopy did not reveal any significant abnormality.  She did have removal of 2 polyps, 1 of which was a villous adenoma, and the other a hyperplastic polyp.   REVIEW OF SYSTEMS:  Review of Systems  Constitutional: Negative for appetite change, chills, fatigue, fever and unexpected weight change.  HENT:   Negative for lump/mass, mouth sores and sore throat.   Respiratory: Negative for cough and shortness of breath.   Cardiovascular: Negative for chest pain and leg swelling.  Gastrointestinal: Negative for abdominal pain, constipation, diarrhea, nausea and vomiting.  Endocrine: Negative  for hot flashes.  Genitourinary: Negative for difficulty urinating, dysuria, frequency and hematuria.   Musculoskeletal: Negative for arthralgias, back pain and myalgias.  Skin: Negative for rash.  Neurological: Negative for dizziness and headaches.  Hematological: Negative for adenopathy. Does not bruise/bleed easily.  Psychiatric/Behavioral: Negative for depression and sleep disturbance. The patient is not nervous/anxious.      VITALS:  Blood pressure (!) 109/59, pulse 98, temperature 98.7 F (37.1 C), temperature source Oral, resp. rate 18, height 5' (1.524 m), weight 135 lb 3.2 oz (61.3 kg), SpO2 96 %.  Wt Readings from Last 3 Encounters:  12/31/20 135 lb 3.2 oz (61.3 kg)  10/21/20 139 lb (63 kg)  10/03/20 141 lb (64 kg)    Body mass index is 26.4 kg/m.  Performance status (ECOG): 0 - Asymptomatic  PHYSICAL EXAM:  Physical Exam Vitals and nursing note reviewed.  Constitutional:      General: She is not in acute distress.    Appearance: Normal appearance.  HENT:     Head: Normocephalic and atraumatic.     Mouth/Throat:     Mouth: Mucous membranes are moist.     Pharynx: Oropharynx is clear. No oropharyngeal exudate or posterior oropharyngeal erythema.  Eyes:     General: No scleral icterus.    Extraocular Movements: Extraocular movements intact.     Conjunctiva/sclera: Conjunctivae normal.  Pupils: Pupils are equal, round, and reactive to light.  Cardiovascular:     Rate and Rhythm: Normal rate and regular rhythm.     Heart sounds: Normal heart sounds. No murmur heard. No friction rub. No gallop.   Pulmonary:     Effort: Pulmonary effort is normal.     Breath sounds: Normal breath sounds. No wheezing, rhonchi or rales.  Chest:  Breasts:     Right: No swelling, inverted nipple, mass, axillary adenopathy or supraclavicular adenopathy.     Left: Inverted nipple (chronic finding) present. No swelling, mass, axillary adenopathy or supraclavicular adenopathy.       Comments:   There is a resolving ecchymosis of the right lower breast with an underlying small hematoma about 1 cm in diameter at the recent biopsy site. Abdominal:     General: There is no distension.     Palpations: Abdomen is soft. There is no hepatomegaly, splenomegaly or mass.     Tenderness: There is no abdominal tenderness.  Musculoskeletal:        General: Normal range of motion.     Cervical back: Normal range of motion and neck supple. No tenderness.     Right lower leg: No edema.     Left lower leg: No edema.  Lymphadenopathy:     Cervical: No cervical adenopathy.     Upper Body:     Right upper body: No supraclavicular or axillary adenopathy.     Left upper body: No supraclavicular or axillary adenopathy.     Lower Body: No right inguinal adenopathy. No left inguinal adenopathy.  Skin:    General: Skin is warm and dry.     Coloration: Skin is not jaundiced.     Findings: No rash.  Neurological:     Mental Status: She is alert and oriented to person, place, and time.     Cranial Nerves: No cranial nerve deficit.  Psychiatric:        Mood and Affect: Mood normal.        Behavior: Behavior normal.        Thought Content: Thought content normal.    LABS:   CBC Latest Ref Rng & Units 10/03/2020 07/04/2020 06/08/2014  WBC - 4.2 6.4 10.3  Hemoglobin 12.0 - 16.0 13.3 13.5 14.7  Hematocrit 36 - 46 39 40.6 45.4  Platelets 150 - 399 213 267 318   CMP Latest Ref Rng & Units 10/03/2020 07/04/2020 06/08/2014  Glucose 70 - 99 mg/dL - 95 138(H)  BUN 4 - 21 10 12 6   Creatinine 0.5 - 1.1 0.8 0.80 0.77  Sodium 137 - 147 139 140 139  Potassium 3.4 - 5.3 4.3 3.9 3.5(L)  Chloride 99 - 108 108 108 101  CO2 13 - 22 26(A) 21(L) 23  Calcium 8.7 - 10.7 9.0 9.1 9.2  Total Protein 6.5 - 8.1 g/dL - 7.4 7.1  Total Bilirubin 0.3 - 1.2 mg/dL - 0.3 0.3  Alkaline Phos 25 - 125 43 32(L) 84  AST 13 - 35 18 19 14   ALT 7 - 35 19 15 15     TSH was normal at 0.501  No results found for: CEA1 /  No results found for: CEA1 No results found for: PSA1 No results found for: AYO459 No results found for: XHF414  No results found for: TOTALPROTELP, ALBUMINELP, A1GS, A2GS, BETS, BETA2SER, GAMS, MSPIKE, SPEI No results found for: TIBC, FERRITIN, IRONPCTSAT No results found for: LDH  STUDIES:  MR BREAST BILATERAL W WO  CONTRAST INC CAD  Result Date: 12/02/2020 CLINICAL DATA:  History of RIGHT breast cancer in 2021 status post lumpectomy. BRCA 1 gene mutation. LABS:  Not applicable EXAM: BILATERAL BREAST MRI WITH AND WITHOUT CONTRAST TECHNIQUE: Multiplanar, multisequence MR images of both breasts were obtained prior to and following the intravenous administration of 6 ml of Gadavist Three-dimensional MR images were rendered by post-processing of the original MR data on an independent workstation. The three-dimensional MR images were interpreted, and findings are reported in the following complete MRI report for this study. Three dimensional images were evaluated at the independent interpreting workstation using the DynaCAD thin client. COMPARISON:  No previous breast MRI. Bilateral diagnostic mammogram dated 11/10/2020. FINDINGS: Breast composition: c. Heterogeneous fibroglandular tissue. Background parenchymal enhancement: Mild Right breast: Circumscribed enhancing mass within the lower inner quadrant of the RIGHT breast, at posterior depth, measuring 7 mm, with predominantly persistent enhancement kinetics (series 11, image 86). Additional linear clumped non-mass enhancement within the upper RIGHT breast, anterior to middle depth, measuring 2.1 cm extent, with persistent enhancement kinetics (series 11, images 51 through 56). Left breast: Irregular mass-like enhancement within the subareolar LEFT breast, measuring 1 cm greatest dimension, with persistent enhancement kinetics (series 11, image 83). This may be artifactual related to nipple inversion. Circumscribed enhancing mass within the upper inner  quadrant of the LEFT breast, anterior to middle depth, measuring 5 mm, with mixed persistent and progressive enhancement kinetics (series 11, image 70). Lymph nodes: No abnormal appearing lymph nodes. Ancillary findings:  None. IMPRESSION: 1. Suspicious linear clumped non-mass enhancement within the upper RIGHT breast, measuring 2.1 cm extent, possibly corresponding to the area of indeterminate calcifications seen on recent diagnostic mammogram. Recommend MRI-guided biopsy with targeting of the more posterior aspect which demonstrates the most confluent enhancement. 2. Circumscribed enhancing mass within the lower inner quadrant of the RIGHT breast, at posterior depth, measuring 7 mm. 3. Circumscribed enhancing mass within the upper inner quadrant of the LEFT breast, anterior to middle depth, measuring 5 mm. MRI-guided biopsy is recommended. 4. Irregular mass-like enhancement within the subareolar LEFT breast, measuring 1 cm greatest dimension, possibly artifactual related to nipple inversion. RECOMMENDATION: 1. MRI-guided biopsy for the linear clumped non-mass enhancement within the upper RIGHT breast, measuring 2.1 cm extent, with targeting of the more posterior aspect which demonstrates the most confluent enhancement. Recommend postprocedure mammogram for correlation with the indeterminate calcifications demonstrated on recent diagnostic mammogram. If biopsy site marker does not correspond to the calcifications, additional stereotactic biopsy (or biopsies) would then be recommended as per the diagnostic mammogram report of 11/10/2020. 2. MRI-guided biopsy for the circumscribed enhancing mass within the upper inner quadrant of the LEFT breast, anterior to middle depth, measuring 5 mm. 3. After the bilateral MRI-guided biopsies are completed, recommend targeted second-look ultrasound for both the circumscribed mass within the lower inner quadrant of the RIGHT breast and the mass-like enhancement within the  subareolar LEFT breast. This could be performed on same day as the MRI-guided biopsies, at the time of postprocedure mammograms. If there is no sonographic correlate for the mass in the lower inner quadrant of the RIGHT breast, and breast conservation surgery is eventually contemplated, MRI-guided biopsy of this additional mass would be recommended. If no sonographic correlate or clinical findings within the subareolar LEFT breast to explain the mass-like enhancement, central duct excision should be considered for definitive characterization. 4. Potential stereotactic biopsies for indeterminate calcifications within the bilateral breasts, pending results of the MRI-guided biopsies and evaluation of biopsy clip  placements. BI-RADS CATEGORY  4: Suspicious. Electronically Signed   By: Franki Cabot M.D.   On: 12/02/2020 11:47   US BREAST LTD UNI LEFT INC AXILLA  Result Date: 12/11/2020 CLINICAL DATA:  Patient with history of right breast lumpectomy in 2021. History of BRCA 1 gene mutation. Patient was seen for annual diagnostic mammography 11/10/2020 which demonstrated suspicious bilateral breast calcifications. It was recommended for the patient to have bilateral breast MRI prior to biopsy of these calcifications. The breast MRI demonstrated multiple lesions within the right and left breast. Patient underwent MRI guided core needle biopsy of a right breast linear non mass enhancement and left breast mass this morning. Patient for second-look ultrasound of mass within the lower inner right breast and retroareolar left breast. EXAM: ULTRASOUND OF THE BILATERAL BREAST COMPARISON:  Previous exam(s). FINDINGS: On physical exam, there is inversion of the left nipple. Patient states this is longstanding and has not changed. Targeted ultrasound is performed, showing no corresponding mass within the lower inner right breast to correspond with the mass identified on prior breast MRI. No retroareolar left breast masses  identified. 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 IMPRESSION: 1. No sonographic correlate for the mass identified within the lower inner right breast on prior MRI. 2. Inverted left nipple felt to correspond with the findings on prior breast MRI. RECOMMENDATION: 1. Await pathology results from bilateral MRI guided core needle biopsies performed 12/11/2020. 2. Given the multitude of findings that are new from last year including suspicious bilateral breast calcifications and multiple findings on MRI within the breast bilaterally as well as patients personal history of BRCA 1, the possibility of bilateral breast mastectomies were discussed with the patient. 3. If the pathology results from this morning's MRI guided core needle biopsy are benign, patient would need two stereotactic guided core needle biopsies of calcifications within the right breast both superior and inferior as well as at least two stereotactic guided core needle biopsies of calcifications within the left breast. Additionally patient would need MRI guided core needle biopsy of the mass within the lower inner right breast without sonographic correlate. 4. Recommend decision for any of these additional biopsies be based on pathology results from this morning as well as discussion of possible surgical treatment. I have discussed the findings and recommendations with the patient. If applicable, a reminder letter will be sent to the patient regarding the next appointment. BI-RADS CATEGORY  4: Suspicious. Electronically Signed   By: Lovey Newcomer M.D.   On: 12/11/2020 11:07   US BREAST LTD UNI RIGHT INC AXILLA  Result Date: 12/11/2020 CLINICAL DATA:  Patient with history of right breast lumpectomy in 2021. History of BRCA 1 gene mutation. Patient was seen for annual diagnostic mammography 11/10/2020 which demonstrated suspicious bilateral breast calcifications. It was recommended for the  patient to have bilateral breast MRI prior to biopsy of these calcifications. The breast MRI demonstrated multiple lesions within the right and left breast. Patient underwent MRI guided core needle biopsy of a right breast linear non mass enhancement and left breast mass this morning. Patient for second-look ultrasound of mass within the lower inner right breast and retroareolar left breast. EXAM: ULTRASOUND OF THE BILATERAL BREAST COMPARISON:  Previous exam(s). FINDINGS: On physical exam, there is inversion of the left nipple. Patient states this is longstanding and has not changed. Targeted ultrasound is performed, showing no corresponding mass within the lower inner right breast to correspond with the  mass identified on prior breast MRI. No retroareolar left breast masses identified. 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 IMPRESSION: 1. No sonographic correlate for the mass identified within the lower inner right breast on prior MRI. 2. Inverted left nipple felt to correspond with the findings on prior breast MRI. RECOMMENDATION: 1. Await pathology results from bilateral MRI guided core needle biopsies performed 12/11/2020. 2. Given the multitude of findings that are new from last year including suspicious bilateral breast calcifications and multiple findings on MRI within the breast bilaterally as well as patients personal history of BRCA 1, the possibility of bilateral breast mastectomies were discussed with the patient. 3. If the pathology results from this morning's MRI guided core needle biopsy are benign, patient would need two stereotactic guided core needle biopsies of calcifications within the right breast both superior and inferior as well as at least two stereotactic guided core needle biopsies of calcifications within the left breast. Additionally patient would need MRI guided core needle biopsy of the mass within the lower inner right  breast without sonographic correlate. 4. Recommend decision for any of these additional biopsies be based on pathology results from this morning as well as discussion of possible surgical treatment. I have discussed the findings and recommendations with the patient. If applicable, a reminder letter will be sent to the patient regarding the next appointment. BI-RADS CATEGORY  4: Suspicious. Electronically Signed   By: Lovey Newcomer M.D.   On: 12/11/2020 11:07   MM CLIP PLACEMENT LEFT  Result Date: 12/11/2020 CLINICAL DATA:  45 year old female status post bilateral MRI guided biopsies. EXAM: DIAGNOSTIC BILATERAL MAMMOGRAM POST MRI BIOPSY COMPARISON:  Previous exam(s). FINDINGS: Mammographic images were obtained following MRI guided biopsy of bilateral breasts. Most post biopsy marking clips are in the expected locations. IMPRESSION: Appropriate positioning of bilateral post biopsy clips. Final Assessment: Post Procedure Mammograms for Marker Placement Electronically Signed   By: Kristopher Oppenheim M.D.   On: 12/11/2020 10:09   MM CLIP PLACEMENT RIGHT  Result Date: 12/24/2020 CLINICAL DATA:  Confirmation of clip placement after stereotactic tomosynthesis core needle biopsy of 2 separate groups of calcifications involving the LOWER RIGHT breast. EXAM: 2D AND 3D DIAGNOSTIC RIGHT MAMMOGRAM POST STEREOTACTIC BIOPSY COMPARISON:  Previous exam(s). FINDINGS: Tomosynthesis and synthesized full field CC and mediolateral images were obtained following stereotactic tomosynthesis guided biopsy of 2 separate groups calcifications involving the LOWER RIGHT breast. The coil shaped biopsy marking clip is appropriately positioned at the site of the biopsied calcifications in the LOWER OUTER QUADRANT of the RIGHT breast at POSTERIOR depth. The X shaped biopsy marking clip is appropriately positioned at the site of the biopsied calcifications in the LOWER RIGHT breast at ANTERIOR depth, near 6 o'clock location. The clips are separated  by approximately 3.3 cm. Expected post biopsy changes are present at both sites without evidence of hematoma. IMPRESSION: 1. Appropriate positioning of the coil shaped biopsy marking clip at the site of the biopsied calcifications in the LOWER OUTER QUADRANT at POSTERIOR depth. 2. Appropriate positioning of the X shaped biopsy marking clip at the site of the biopsied calcifications in the LOWER breast at ANTERIOR depth, near 6 o'clock location. 3. The clips are approximately 3.3 cm apart. Final Assessment: Post Procedure Mammograms for Marker Placement Electronically Signed   By: Evangeline Dakin M.D.   On: 12/24/2020 11:43   MM CLIP PLACEMENT RIGHT  Result Date: 12/11/2020 CLINICAL DATA:  45 year old female status post bilateral  MRI guided biopsies. EXAM: DIAGNOSTIC BILATERAL MAMMOGRAM POST MRI BIOPSY COMPARISON:  Previous exam(s). FINDINGS: Mammographic images were obtained following MRI guided biopsy of bilateral breasts. Most post biopsy marking clips are in the expected locations. IMPRESSION: Appropriate positioning of bilateral post biopsy clips. Final Assessment: Post Procedure Mammograms for Marker Placement Electronically Signed   By: Kristopher Oppenheim M.D.   On: 12/11/2020 10:09   MR LT BREAST BX W LOC DEV 1ST LESION IMAGE BX SPEC MR GUIDE  Addendum Date: 12/15/2020   ADDENDUM REPORT: 12/12/2020 14:35 ADDENDUM: Pathology revealed FIBROADENOMATOID CHANGE of the RIGHT breast, upper inner quadrant. This was found to be concordant by Dr. Peggye Fothergill. 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 This was found to be concordant by Dr. Peggye Fothergill. Pathology results were discussed with the patient by telephone. The patient reported doing well after the biopsies with tenderness at the sites. Post biopsy instructions and care were reviewed and questions were answered. The patient was encouraged to call [facility] for any additional concerns. My direct phone number was provided. The patient is scheduled for a RIGHT breast stereotatic guided biopsy at [facility] on Dec 24, 2020. Further recommendations will be guided by the results of this biopsy. Danford Bad with Dr. Orrin Brigham at [facility] in [place], [place] was notified of biopsy results and recommendations by telephone on December 12, 2020. Pathology results reported by Terie Purser, RN on 12/12/2020. Electronically Signed   By: Kristopher Oppenheim M.D.   On: 12/12/2020 14:35   Result Date: 12/15/2020 CLINICAL DATA:  45 year old female for bilateral breast biopsies. EXAM: MRI GUIDED CORE NEEDLE BIOPSY OF THE bilateral BREAST TECHNIQUE: Multiplanar, multisequence MR imaging of the bilateral breast was performed both before and after administration of intravenous contrast. CONTRAST:  5 mL Gadavist COMPARISON:  Previous exams. FINDINGS: I met with the patient, and we discussed the procedure of MRI guided biopsy, including risks, benefits, and alternatives. Specifically, we discussed the risks of infection, bleeding, tissue injury, clip migration, and inadequate sampling. Informed, written consent was given. The usual time out protocol was performed immediately prior to the procedure. Using sterile technique, 1% Lidocaine, MRI guidance, and a 9 gauge vacuum assisted device, biopsy was performed of the upper right breast using a lateral approach. At the conclusion of the procedure, a cylinder shaped tissue marker clip was deployed into the biopsy cavity. Using sterile technique, 1% Lidocaine, MRI guidance, and a 9 gauge vacuum assisted device, biopsy was performed of upper left breast using a lateral approach. At the conclusion of the procedure, a barbell shaped tissue marker clip was deployed into the biopsy cavity. Follow-up 2-view mammogram was performed and dictated separately. IMPRESSION: MRI guided biopsy of bilateral breasts.  No apparent  complications. Electronically Signed: By: Kristopher Oppenheim M.D. On: 12/11/2020 10:04   MM RT BREAST BX W LOC DEV 1ST LESION IMAGE BX SPEC STEREO GUIDE  Addendum Date: 12/29/2020   ADDENDUM REPORT: 12/26/2020 14:44 ADDENDUM: Pathology revealed DIMINUTIVE COMPLEX SCLEROSING LESION WITH CALCIFICATIONS, BENIGN GLANDS WITH CALCIFICATIONS of the RIGHT breast, lower outer quadrant at posterior depth, (coil clip). The complex sclerosing lesion is small (<2 mm) and likely completely excised. There are reactive nuclear changes and abundant calcifications consistent with the patient's prior radiation. This was found to be concordant by Dr. Peggye Fothergill, with surgical consultation recommended. Pathology revealed BENIGN GLANDS WITH CALCIFICATIONS of the RIGHT breast, lower at anterior  depth, (X clip). This was found to be concordant by Dr. Peggye Fothergill. Pathology results were discussed with the patient by telephone. The patient reported doing well after the biopsies with tenderness at the sites. Post biopsy instructions and care were reviewed and questions were answered. The patient was encouraged to call [facility] for any additional concerns. My direct phone number was provided. Surgical consultation has been arranged with Dr. Orrin Brigham at [facility], [place] location, on Dec 31, 2020. The patient is scheduled for LEFT breast stereotatic guided biopsy x 2 for segmental calcifications in the upper outer quadrant, anterior and posterior extent, on Jan 05, 2021. Further recommendations will be guided by the results of these biopsies. Pathology results and recommendations were discussed by telephone with Danford Bad at Dr. Maxie Barb office on Dec 26, 2020. Pathology and imaging reports were faxed to Dr. Lilia Merritt on Dec 26, 2020. Pathology results reported by Terie Purser, RN on 12/26/2020. Electronically Signed   By: Evangeline Dakin M.D.   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 Adjuvant radiation therapy. Initial post lumpectomy mammogram demonstrated 2 groups of indeterminate calcifications involving the lower RIGHT breast. In the meantime, the patient underwent supplemental Breast MRI and had masses in the upper RIGHT breast and the upper LEFT breast which were biopsied under MRI guidance, pathology demonstrating fibroadenomas. EXAM: RIGHT BREAST STEREOTACTIC CORE NEEDLE BIOPSY COMPARISON:  Previous exams. FINDINGS: The patient and I discussed the procedure of stereotactic-guided biopsy including benefits and alternatives. We discussed the high likelihood of a successful procedure. We discussed the risks of the procedure including infection, bleeding, tissue injury, clip migration, and inadequate sampling. Informed written consent was given. The usual time out protocol was performed immediately prior to the procedure. # 1) POSTERIOR calcifications, lesion quadrant: LOWER OUTER QUADRANT. Using sterile technique with chlorhexidine as skin antisepsis, 1% lidocaine and 1% lidocaine with epinephrine as local anesthetic, under stereotactic tomosynthesis guidance, a 9 gauge Brevera vacuum assisted device was used to perform core needle biopsy of calcifications involving the lower LEFT breast at POSTERIOR depth using a lateral approach. Specimen radiograph was performed showing calcifications in at least 3 of the core samples. Specimens with calcifications are identified for pathology. At the conclusion of the procedure, a coil shaped tissue marker clip was deployed into the biopsy cavity. # 2) ANTERIOR calcifications, lesion quadrant: LOWER breast, near 6 o'clock location. Using sterile technique with chlorhexidine as skin antisepsis, 1% lidocaine and 1% lidocaine with epinephrine as  local anesthetic, under stereotactic tomosynthesis guidance, a 9 gauge Brevera vacuum assisted device was used perform core needle biopsy of calcifications involving the lower LEFT breast at ANTERIOR depth using a lateral approach. Specimen radiograph was performed showing calcifications in all 3 core samples. Specimens with calcifications are identified for pathology. At the conclusion of the procedure, an X shaped tissue marker clip was deployed into the biopsy cavity. Follow-up 2-view mammogram was performed and dictated separately. IMPRESSION: Stereotactic-guided biopsy of 2 separate groups of calcifications involving the LOWER LEFT breast. No apparent complications. Electronically Signed: By: Evangeline Dakin M.D. On: 12/24/2020 11:38   MM RT BREAST BX W LOC DEV EA AD LESION IMG BX SPEC STEREO GUIDE  Addendum Date: 12/29/2020   ADDENDUM REPORT: 12/26/2020 14:44 ADDENDUM: Pathology revealed DIMINUTIVE COMPLEX  SCLEROSING LESION WITH CALCIFICATIONS, BENIGN GLANDS WITH CALCIFICATIONS of the RIGHT breast, lower outer quadrant at posterior depth, (coil clip). The complex sclerosing lesion is small (<2 mm) and likely completely excised. There are reactive nuclear changes and abundant calcifications consistent with the patient's prior radiation. This was found to be concordant by Dr. Peggye Fothergill, with surgical consultation recommended. Pathology revealed BENIGN GLANDS WITH CALCIFICATIONS of the RIGHT breast, lower at anterior depth, (X clip). This was found to be concordant by Dr. Peggye Fothergill. Pathology results were discussed with the patient by telephone. The patient reported doing well after the biopsies with tenderness at the sites. Post biopsy instructions and care were reviewed and questions were answered. The patient was encouraged to call [facility] for any additional concerns. My direct phone number was provided. Surgical consultation has been arranged with Dr. Orrin Brigham  at [facility], [place] location, on Dec 31, 2020. The patient is scheduled for LEFT breast stereotatic guided biopsy x 2 for segmental calcifications in the upper outer quadrant, anterior and posterior extent, on Jan 05, 2021. Further recommendations will be guided by the results of these biopsies. Pathology results and recommendations were discussed by telephone with Danford Bad at Dr. Maxie Barb office on Dec 26, 2020. Pathology and imaging reports were faxed to Dr. Lilia Merritt on Dec 26, 2020. Pathology results reported by Terie Purser, RN on 12/26/2020. Electronically Signed   By: Evangeline Dakin M.D.   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 Adjuvant radiation therapy. Initial post lumpectomy mammogram demonstrated 2 groups of indeterminate calcifications involving the lower RIGHT breast. In the meantime, the patient underwent supplemental Breast MRI and had masses in the upper RIGHT breast and the upper LEFT breast which were biopsied under MRI guidance, pathology demonstrating fibroadenomas. EXAM: RIGHT BREAST STEREOTACTIC CORE NEEDLE BIOPSY COMPARISON:  Previous exams. FINDINGS: The patient and I discussed the procedure of stereotactic-guided biopsy including benefits and alternatives. We discussed the high likelihood of a successful procedure. We discussed the risks of the procedure including infection, bleeding, tissue injury, clip migration, and inadequate sampling. Informed written consent was given. The usual time out protocol was performed immediately prior to the procedure. # 1) POSTERIOR calcifications, lesion quadrant: LOWER OUTER QUADRANT. Using sterile technique with chlorhexidine as skin antisepsis, 1% lidocaine and 1% lidocaine with epinephrine as local anesthetic, under stereotactic  tomosynthesis guidance, a 9 gauge Brevera vacuum assisted device was used to perform core needle biopsy of calcifications involving the lower LEFT breast at POSTERIOR depth using a lateral approach. Specimen radiograph was performed showing calcifications in at least 3 of the core samples. Specimens with calcifications are identified for pathology. At the conclusion of the procedure, a coil shaped tissue marker clip was deployed into the biopsy cavity. # 2) ANTERIOR calcifications, lesion quadrant: LOWER breast, near 6 o'clock location. Using sterile technique with chlorhexidine as skin antisepsis, 1% lidocaine and 1% lidocaine with epinephrine as local anesthetic, under stereotactic tomosynthesis guidance, a 9 gauge Brevera vacuum assisted device was used perform core needle biopsy of calcifications involving the lower LEFT breast at ANTERIOR depth using a lateral approach. Specimen radiograph was performed showing calcifications in all 3 core samples. Specimens with calcifications are identified for pathology. At the conclusion of the procedure, an X shaped tissue marker clip  was deployed into the biopsy cavity. Follow-up 2-view mammogram was performed and dictated separately. IMPRESSION: Stereotactic-guided biopsy of 2 separate groups of calcifications involving the LOWER LEFT breast. No apparent complications. Electronically Signed: By: Evangeline Dakin M.D. On: 12/24/2020 11:38   MR RT BREAST BX W LOC DEV 1ST LESION IMAGE BX SPEC MR GUIDE  Addendum Date: 12/15/2020   ADDENDUM REPORT: 12/12/2020 14:35 ADDENDUM: Pathology revealed FIBROADENOMATOID CHANGE of the RIGHT breast, upper inner quadrant. This was found to be concordant by Dr. Peggye Fothergill. 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 This was found to be concordant by Dr. Peggye Fothergill. Pathology results were discussed with the patient by telephone. The patient  reported doing well after the biopsies with tenderness at the sites. Post biopsy instructions and care were reviewed and questions were answered. The patient was encouraged to call [facility] for any additional concerns. My direct phone number was provided. The patient is scheduled for a RIGHT breast stereotatic guided biopsy at [facility] on Dec 24, 2020. Further recommendations will be guided by the results of this biopsy. Danford Bad with Dr. Orrin Brigham at [facility] in [place], [place] was notified of biopsy results and recommendations by telephone on December 12, 2020. Pathology results reported by Terie Purser, RN on 12/12/2020. Electronically Signed   By: Kristopher Oppenheim M.D.   On: 12/12/2020 14:35   Result Date: 12/15/2020 CLINICAL DATA:  45 year old female for bilateral breast biopsies. EXAM: MRI GUIDED CORE NEEDLE BIOPSY OF THE bilateral BREAST TECHNIQUE: Multiplanar, multisequence MR imaging of the bilateral breast was performed both before and after administration of intravenous contrast. CONTRAST:  5 mL Gadavist COMPARISON:  Previous exams. FINDINGS: I met with the patient, and we discussed the procedure of MRI guided biopsy, including risks, benefits, and alternatives. Specifically, we discussed the risks of infection, bleeding, tissue injury, clip migration, and inadequate sampling. Informed, written consent was given. The usual time out protocol was performed immediately prior to the procedure. Using sterile technique, 1% Lidocaine, MRI guidance, and a 9 gauge vacuum assisted device, biopsy was performed of the upper right breast using a lateral approach. At the conclusion of the procedure, a cylinder shaped tissue marker clip was deployed into the biopsy cavity. Using sterile technique, 1% Lidocaine, MRI guidance, and a 9 gauge vacuum assisted device, biopsy was performed of upper left breast using a lateral approach. At the conclusion of the procedure, a  barbell shaped tissue marker clip was deployed into the biopsy cavity. Follow-up 2-view mammogram was performed and dictated separately. IMPRESSION: MRI guided biopsy of bilateral breasts.  No apparent complications. Electronically Signed: By: Kristopher Oppenheim M.D. On: 12/11/2020 10:04     Exam(s): [id number] MAM/MAM DIGITAL W/TOMO DIAG B CLINICAL DATA:  45 year old female presenting for routine annual surveillance status post right breast lumpectomy in 2021. Pathology from her ultrasound-guided biopsy indicated invasive ductal carcinoma along with DCIS. The patient states that she has the BRCA 1 gene mutation.  EXAM: DIGITAL DIAGNOSTIC BILATERAL MAMMOGRAM WITH TOMOSYNTHESIS AND CAD  TECHNIQUE: Bilateral digital diagnostic mammography and breast tomosynthesis was performed. The images were evaluated with computer-aided detection.  COMPARISON:  Previous exam(s).  ACR Breast Density Category c: The breast tissue is heterogeneously dense, which may obscure small masses.  FINDINGS: Indeterminate calcifications have developed in both breasts. In the inferior posterior right breast, there is a 1.8 cm group of amorphous calcifications which are actually best  seen on the full paddle view. They are not well visualized on the magnified images. There is a smaller group of calcifications in the superior central right breast spanning approximately 0.8 cm. The lumpectomy site in the upper-outer quadrant of the right breast appears stable.  There are indeterminate segmental calcifications in the superior to upper-outer left breast spanning approximately 4-5 cm.  No masses or areas of distortion are seen in the bilateral breasts.  IMPRESSION: 1. There are 2 indeterminate groups of calcifications in the right breast.  2. There is a 4-5 cm group of indeterminate segmental calcifications in the upper-outer left breast.  RECOMMENDATION: 1. Breast MRI is recommended given the personal history of  BRCA 1 gene mutation and right breast cancer. If any abnormalities are identified on MRI, those biopsies should be performed first. If the MRI is normal, stereotactic biopsy is recommended for the 2 sites of calcifications in the right breast and for the anterior and posterior margin of the calcifications in the left breast. It is preferable that the MRI be performed before any stereotactic biopsy so that we do not introduce biopsy changes that may make the interpretation of the MRI more difficult.  I have discussed the findings and recommendations with the patient. If applicable, a reminder letter will be sent to the patient regarding the next appointment.  BI-RADS CATEGORY  4: Suspicious.   HISTORY:   Past Medical History:  Diagnosis Date  . Anxiety   . Anxiety   . Breast cancer ([place]) 2021  . DDD (degenerative disc disease)   . Migraine   . Seizures ([place])     Past Surgical History:  Procedure Laterality Date  . BREAST LUMPECTOMY    . CESAREAN SECTION    . CHOLECYSTECTOMY    . ENDOMETRIAL ABLATION    . TUBAL LIGATION      Family History  Problem Relation Age of Onset  . Colitis Mother   . Colon cancer Paternal Uncle   . Diabetes Paternal Grandmother     Social History:  reports that she has been smoking cigarettes. She has been smoking about 1.50 packs per day. She has quit using smokeless tobacco. She reports that she does not drink alcohol and does not use drugs.The patient is alone today.  Allergies:  Allergies  Allergen Reactions  . Ketorolac Itching  . Ketorolac Tromethamine Itching  . Sumatriptan Other (See Comments) and Anaphylaxis    Stroke risk  . Meperidine Hcl     Combative  . Meperidine Other (See Comments)    combative Other reaction(s): Other (See Comments) combative    Current Medications: Current Outpatient Medications  Medication Sig Dispense Refill  . albuterol (PROVENTIL HFA;VENTOLIN HFA) 108 (90 BASE) MCG/ACT inhaler Inhale 2 puffs  into the lungs every 6 (six) hours as needed for wheezing or shortness of breath.    . busPIRone (BUSPAR) 30 MG tablet Take 30 mg by mouth in the morning, at noon, and at bedtime.    . clonazePAM (KLONOPIN) 1 MG tablet Take 1 mg by mouth as directed. Take 0.5 tablet four times a day    . hydrOXYzine (ATARAX/VISTARIL) 50 MG tablet Take 1-1.5 tablets by mouth at bedtime as needed.    . linaclotide (LINZESS) 72 MCG capsule Take 1 capsule (72 mcg total) by mouth daily before breakfast. 30 capsule 11  . omeprazole (PRILOSEC) 20 MG capsule Take 1 capsule (20 mg total) by mouth daily. 90 capsule 3  . tamoxifen (NOLVADEX) 20 MG tablet Take 1  tablet by mouth once daily 90 tablet 3   Current Facility-Administered Medications  Medication Dose Route Frequency Provider Last Rate Last Admin  . 0.9 %  sodium chloride infusion  500 mL Intravenous Once Jackquline Denmark, MD         ASSESSMENT & PLAN:   Assessment:  1.  Stage IA (T1b N0 M0) hormone receptor positive breast cancer, treated with lumpectomy and adjuvant radiation.  She continues adjuvant hormonal therapy with tamoxifen daily and is tolerating this well, except for fatigue. She remains without evidence of recurrence. 2.  CHEK2 heterozygous gene mutation.  This is a high risk gene related to an increased risk of breast cancer with her cancer risk being 20-31% when compared to the risk for the general population of 10.2%.  Her risk for developing a secondary primary within 10 years is up to 29%.  This gene is also associated with a possible elevated risk for colorectal cancer.  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 EGD and colonoscopy in March was negative. 3.  Seizure disorder; she has been without an episode in about 4 years. 4.  Tobacco abuse.  I discussed the importance of the cessation of smoking.  5.  Depression/anxiety, she was given venlafaxine in September to help her hot flashes and  depression and told to discontinue duloxetine.  She is no longer taking the duloxetine, but also discontinued venlafaxine, as she did not like the way it made her feel.  She continues clonazepam and buspirone. 6.  Multiple areas of calcifications within both breasts, status post multiple biopsies without obvious malignancy.  She saw Dr. Lilia Merritt today and discussed bilateral mastectomies with immediate reconstruction.  She has been referred to Dr. Marla Merritt regarding reconstructive surgery.  Plan:     She knows to continue tamoxifen daily.  She will likely proceed with bilateral mastectomies and immediate reconstruction.  We will plan to see her back in 3 months for re-examination.  The patient understands the plans discussed today and is in agreement with them.  She knows to contact our office if she develops concerns prior to her next appointment.     Marvia Pickles, PA-C

## 2021-01-01 ENCOUNTER — Encounter: Payer: Self-pay | Admitting: Hematology and Oncology

## 2021-01-20 ENCOUNTER — Ambulatory Visit (INDEPENDENT_AMBULATORY_CARE_PROVIDER_SITE_OTHER): Payer: Medicare (Managed Care) | Admitting: Plastic Surgery

## 2021-01-20 ENCOUNTER — Encounter: Payer: Self-pay | Admitting: Plastic Surgery

## 2021-01-20 ENCOUNTER — Other Ambulatory Visit: Payer: Self-pay

## 2021-01-20 VITALS — BP 96/64 | HR 79 | Ht 60.0 in | Wt 138.2 lb

## 2021-01-20 DIAGNOSIS — C50411 Malignant neoplasm of upper-outer quadrant of right female breast: Secondary | ICD-10-CM | POA: Diagnosis not present

## 2021-01-20 DIAGNOSIS — Z170 Estrogen receptor positive status [ER+]: Secondary | ICD-10-CM

## 2021-01-20 NOTE — Progress Notes (Signed)
Patient ID: Brianna Merritt, female    DOB: 01-14-1976, 45 y.o.   MRN: [id number]   Chief Complaint  Patient presents with  . Breast Problem    The patient is a 45 year old female here for evaluation for breast reconstruction joined by her husband on the phone.  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  She has had multiple biopsies.  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  She has been treated with a right lumpectomy and axillary sentinel lymph node biopsy April 2021.  The pathology showed an 8 mm grade 2 invasive ductal carcinoma with ductal carcinoma in situ that was intermediate nuclear grade.  The margins were free of neoplasm.  The sentinel lymph node was negative.  She did receive radiation to the right breast which ended in July.  She was placed on tamoxifen but had an interaction and was switched to venlafaxine.  She did stop taking the venlafaxine because of the way it made her feel.  She has a history of a hiatal hernia, gastric ulcers, seizure disorder, migraines.  Due to the multiple irregularities in her breast exams and need for biopsies in coordination with her CHEK mutation it is advised that she had bilateral mastectomies.  She is 5 feet tall and weighs 138 pounds.  Her preoperative bra size is a B but she says her normal is a C cup. The patient is here requesting surgical management and information for reconstruction.  She is planning on having bilateral mastectomies.   Review of Systems  Constitutional: Negative.   HENT: Negative.   Eyes: Negative.   Respiratory: Negative.  Negative for chest tightness.   Cardiovascular: Negative.  Negative for chest pain and leg swelling.  Gastrointestinal: Negative.  Negative for abdominal distention.  Endocrine: Negative.    Genitourinary: Negative.   Musculoskeletal: Negative.   Skin: Negative.   Neurological: Negative.   Psychiatric/Behavioral: Negative.     Past Medical History:  Diagnosis Date  . Anxiety   . Anxiety   . Breast cancer ([place]) 2021  . DDD (degenerative disc disease)   . Migraine   . Seizures ([place])     Past Surgical History:  Procedure Laterality Date  . BREAST LUMPECTOMY    . CESAREAN SECTION    . CHOLECYSTECTOMY    . ENDOMETRIAL ABLATION    . TUBAL LIGATION        Current Outpatient Medications:  .  albuterol (PROVENTIL HFA;VENTOLIN HFA) 108 (90 BASE) MCG/ACT inhaler, Inhale 2 puffs into the lungs every 6 (six) hours as needed for wheezing or shortness of breath., Disp: , Rfl:  .  busPIRone (BUSPAR) 30 MG tablet, Take 30 mg by mouth in the morning, at noon, and at bedtime., Disp: , Rfl:  .  clonazePAM (KLONOPIN) 1 MG tablet, Take 1 mg by mouth as directed. Take 0.5 tablet four times a day, Disp: , Rfl:  .  hydrOXYzine (ATARAX/VISTARIL) 50 MG tablet, Take 1-1.5 tablets by mouth at bedtime as needed., Disp: , Rfl:  .  linaclotide (LINZESS) 72 MCG capsule, Take 1 capsule (72 mcg total) by mouth daily before breakfast., Disp: 30 capsule, Rfl: 11 .  omeprazole (PRILOSEC) 20 MG capsule, Take 1 capsule (20 mg total) by mouth daily., Disp: 90 capsule, Rfl: 3 .  tamoxifen (NOLVADEX) 20 MG tablet, Take 1 tablet by mouth once daily, Disp: 90 tablet, Rfl: 3  Current Facility-Administered Medications:  .  0.9 %  sodium chloride infusion, 500 mL, Intravenous, Once, Jackquline Denmark, MD   Objective:   Vitals:   01/20/21 1422  BP: 96/64  Pulse: 79  SpO2: 100%    Physical Exam Vitals and nursing note reviewed.  Constitutional:      Appearance: Normal appearance.  HENT:     Head: Normocephalic and atraumatic.  Eyes:     Pupils: Pupils are equal, round, and reactive to light.  Cardiovascular:     Rate and Rhythm: Normal rate.     Pulses: Normal pulses.  Pulmonary:     Effort:  Pulmonary effort is normal.  Abdominal:     General: Abdomen is flat. There is no distension.     Tenderness: There is no abdominal tenderness.  Musculoskeletal:        General: No swelling or deformity. Normal range of motion.  Skin:    General: Skin is warm.     Capillary Refill: Capillary refill takes less than 2 seconds.     Coloration: Skin is not jaundiced.     Findings: No bruising.  Neurological:     General: No focal deficit present.     Mental Status: She is alert and oriented to person, place, and time.  Psychiatric:        Mood and Affect: Mood normal.        Behavior: Behavior normal.     Assessment & Plan:  Malignant neoplasm of upper-outer quadrant of right breast in female, estrogen receptor positive ([place])   The options for reconstruction we explained to the patient / family for breast reconstruction.  There are two general categories of reconstruction.  We can reconstruction a breast with implants or use the patient's own tissue.  These were further discussed as listed.  Breast reconstruction is an optional procedure and eligibility depends on the full spectrum of the health of the patient and any co-morbidities.  More than one surgery is often needed to complete the reconstruction process.  The process can take three to twelve months to complete.  The breasts will not be identical due to many factors such as rib differences, shoulder asymmetry and treatments such as radiation.  The goal is to get the breasts to look normal and symmetrical in clothes.  Scars are a part of surgery and may fade some in time but will always be present under clothes.  Surgery may be an option on the non-cancer breast to achieve more symmetry.  No matter which procedure is chosen there is always the risk of complications and even failure of the body to heal.  This could result in no breast.    The options for reconstruction include:  1. Placement of a tissue expander with Acellular dermal  matrix. When the expander is the desired size surgery is performed to remove the expander and place an implant.  In some cases the implant can be placed without an expander.  2. Autologous reconstruction can include using a muscle or tissue from another area of the body to create a breast.  3. Combined procedures (ie. latissismus dorsi flap) can be done with an expander / implant placed under the muscle.   The risks, benefits, scars and recovery time were discussed for each of the above. Risks include bleeding, infection, hematoma, seroma, scarring, pain, wound healing complications, flap loss, fat necrosis, capsular contracture,  need for implant removal, donor site complications, bulge, hernia, umbilical necrosis, need for urgent reoperation, and need for dressing changes.   The procedure the patient selected / that was best for the patient, was then discussed in further detail.  Total time: 45 minutes. This includes time spent with the patient during the visit as well as time spent before and after the visit reviewing the chart, documenting the encounter, making phone calls and reviewing studies.   Due to the size of the patient's abdomen she is not a good candidate for a TRAM or a Diep flap because of the size that she wants to be at the end.  The lower abdomen would probably leave her with an A or B size breast considering both breasts need to be reconstructed.  She would like to go with implant-based reconstruction.  I explained that because of the radiation on the right side she may not be able to be expanded to her desired size.  If that is the case she will need a latissimus muscle flap.  This will be 2-3 staged procedure in total.  Her nipple areolas could be salvaged or at least tried to be salvaged.  Due to the radiation the right side may not survive and if that is the case would need to be removed.  Therefore a lateral incision to the areola is most likely her best option.  We will email the  patient information and I remain available to talk with her again.  I spoke with Dr. Lilia Pro and reviewed all of the above information.  Current plan is for bilateral immediate breast reconstruction with expander and Flex HD placement.  Pictures were obtained of the patient and placed in the chart with the patient's or guardian's permission.   Pictures were obtained of the patient and placed in the chart with the patient's or guardian's permission.  [place], DO

## 2021-01-21 ENCOUNTER — Encounter: Payer: Self-pay | Admitting: Plastic Surgery

## 2021-03-03 ENCOUNTER — Telehealth: Payer: Self-pay

## 2021-03-03 ENCOUNTER — Other Ambulatory Visit: Payer: Self-pay

## 2021-03-03 ENCOUNTER — Encounter: Payer: Self-pay | Admitting: Surgical

## 2021-03-03 ENCOUNTER — Ambulatory Visit (INDEPENDENT_AMBULATORY_CARE_PROVIDER_SITE_OTHER): Payer: Medicare Other | Admitting: Surgical

## 2021-03-03 VITALS — BP 111/74 | HR 81 | Ht 60.0 in | Wt 131.6 lb

## 2021-03-03 DIAGNOSIS — C50411 Malignant neoplasm of upper-outer quadrant of right female breast: Secondary | ICD-10-CM

## 2021-03-03 DIAGNOSIS — Z170 Estrogen receptor positive status [ER+]: Secondary | ICD-10-CM

## 2021-03-03 MED ORDER — HYDROCODONE-ACETAMINOPHEN 5-325 MG PO TABS: 1.0000 | ORAL_TABLET | Freq: Four times a day (QID) | ORAL | 0 refills | Status: AC | PRN

## 2021-03-03 MED ORDER — CEPHALEXIN 500 MG PO CAPS: 500.0000 mg | ORAL_CAPSULE | Freq: Four times a day (QID) | ORAL | 0 refills | Status: AC

## 2021-03-03 MED ORDER — ONDANSETRON HCL 4 MG PO TABS: 4.0000 mg | ORAL_TABLET | Freq: Three times a day (TID) | ORAL | 0 refills | Status: DC | PRN

## 2021-03-03 MED ORDER — DIAZEPAM 2 MG PO TABS: 2.0000 mg | ORAL_TABLET | Freq: Two times a day (BID) | ORAL | 0 refills | Status: DC | PRN

## 2021-03-03 NOTE — Telephone Encounter (Signed)
Brianna Merritt from [facility] left voicemail requesting call back regarding Valium prescription. Patient is prescribed clonazepam that is filled every month and Brianna Merritt wanted to make sure we are aware of this.

## 2021-03-03 NOTE — H&P (View-Only) (Signed)
Patient ID: Brianna Merritt, female    DOB: 1976-07-12, 45 y.o.   MRN: [id number]  Chief Complaint  Patient presents with   Pre-op Exam      ICD-10-CM   1. Malignant neoplasm of upper-outer quadrant of right breast in female, estrogen receptor positive ([place])  C50.411    Z17.0       History of Present Illness: Brianna Merritt is a 45 y.o.  female  with a history of right invasive ductal carcinoma.  She presents for preoperative evaluation for upcoming procedure, immediate bilateral breast reconstruction with placement of tissue expanders and Flex HD, scheduled for 03/25/2021 with Dr. Marla Roe after bilateral nipple sparing mastectomies by Dr. Lilia Pro.  Her husband joins [place] today by telephone  The patient has not had problems with anesthesia. No history of DVT/PE.  No family history of DVT/PE.  No family or personal history of bleeding or clotting disorders.  Patient is not currently taking any blood thinners.  No history of CVA/MI.   Summary of Previous Visit: Patient has a history of radiation to her right breast.  This ended in July.  Her preoperative bra size is a B but she says her normal cup size is a C.  PMH Significant for: Anxiety, migraines, asthma (well-controlled, no hospitalizations)  She reports she is currently on tamoxifen.  She reports no recent changes in her health.  No fevers, chills, nausea, vomiting, chest pain, shortness of breath.  She does report that she is anxious for surgery.  Past Medical History: Allergies: Allergies  Allergen Reactions   Ketorolac Itching   Ketorolac Tromethamine Itching   Sumatriptan Other (See Comments) and Anaphylaxis    Stroke risk   Meperidine Hcl     Combative   Meperidine Other (See Comments)    combative Other reaction(s): Other (See Comments) combative    Current Medications:  Current Outpatient Medications:    albuterol (PROVENTIL HFA;VENTOLIN HFA) 108 (90 BASE) MCG/ACT inhaler, Inhale 2 puffs into  the lungs every 6 (six) hours as needed for wheezing or shortness of breath., Disp: , Rfl:    busPIRone (BUSPAR) 30 MG tablet, Take 30 mg by mouth in the morning, at noon, and at bedtime., Disp: , Rfl:    cephALEXin (KEFLEX) 500 MG capsule, Take 1 capsule (500 mg total) by mouth 4 (four) times daily for 5 days., Disp: 20 capsule, Rfl: 0   clonazePAM (KLONOPIN) 0.5 MG tablet, Take 0.5 mg by mouth 3 (three) times daily as needed., Disp: , Rfl:    diazepam (VALIUM) 2 MG tablet, Take 1 tablet (2 mg total) by mouth every 12 (twelve) hours as needed for muscle spasms., Disp: 20 tablet, Rfl: 0   HYDROcodone-acetaminophen (NORCO) 5-325 MG tablet, Take 1 tablet by mouth every 6 (six) hours as needed for up to 5 days for severe pain., Disp: 20 tablet, Rfl: 0   hydrOXYzine (ATARAX/VISTARIL) 50 MG tablet, Take 1-1.5 tablets by mouth at bedtime as needed., Disp: , Rfl:    linaclotide (LINZESS) 72 MCG capsule, Take 1 capsule (72 mcg total) by mouth daily before breakfast., Disp: 30 capsule, Rfl: 11   omeprazole (PRILOSEC) 20 MG capsule, Take 1 capsule (20 mg total) by mouth daily., Disp: 90 capsule, Rfl: 3   ondansetron (ZOFRAN) 4 MG tablet, Take 1 tablet (4 mg total) by mouth every 8 (eight) hours as needed for nausea or vomiting., Disp: 20 tablet, Rfl: 0   tamoxifen (NOLVADEX) 20 MG tablet, Take 1 tablet by  mouth once daily, Disp: 90 tablet, Rfl: 3   diphenhydramine-acetaminophen (TYLENOL PM) 25-500 MG TABS tablet, Take 1 tablet by mouth at bedtime as needed., Disp: , Rfl:   Current Facility-Administered Medications:    0.9 %  sodium chloride infusion, 500 mL, Intravenous, Once, Jackquline Denmark, MD  Past Medical Problems: Past Medical History:  Diagnosis Date   Anxiety    Anxiety    Breast cancer ([place] [place]) 2021   DDD (degenerative disc disease)    Migraine    Seizures ([facility])     Past Surgical History: Past Surgical History:  Procedure Laterality Date   BREAST LUMPECTOMY     CESAREAN SECTION      CHOLECYSTECTOMY     ENDOMETRIAL ABLATION     TUBAL LIGATION      Social History: Social History   Socioeconomic History   Marital status: Married    Spouse name: Barbaraann Rondo   Number of children: 5   Years of education: Not on file   Highest education level: Not on file  Occupational History   Not on file  Tobacco Use   Smoking status: Former    Packs/day: 1.50    Years: 33.00    Pack years: 49.50    Types: Cigarettes    Start date: 08/23/1988    Quit date: 11/21/2020    Years since quitting: 0.2   Smokeless tobacco: Former   Tobacco comments:    also vape at times  Vaping Use   Vaping Use: Some days  Substance and Sexual Activity   Alcohol use: No   Drug use: No   Sexual activity: Yes  Other Topics Concern   Not on file  Social History Narrative   Not on file   Social Determinants of Health   Financial Resource Strain: Not on file  Food Insecurity: Not on file  Transportation Needs: Not on file  Physical Activity: Not on file  Stress: Not on file  Social Connections: Not on file  Intimate Partner Violence: Not on file    Family History: Family History  Problem Relation Age of Onset   Colitis Mother    Colon cancer Paternal Uncle    Diabetes Paternal Grandmother     Review of Systems: Review of Systems  Constitutional: Negative.   Respiratory: Negative.    Cardiovascular: Negative.   Gastrointestinal: Negative.   Neurological: Negative.    Physical Exam: Vital Signs BP 111/74 (BP Location: Left Arm, Patient Position: Sitting, Cuff Size: Small)   Pulse 81   Ht 5' (1.524 m)   Wt 131 lb 9.6 oz (59.7 kg)   SpO2 98%   BMI 25.70 kg/m   Physical Exam Constitutional:      General: Not in acute distress.    Appearance: Normal appearance. Not ill-appearing.  HENT:     Head: Normocephalic and atraumatic.  Eyes:     Pupils: Pupils are equal, round Neck:     Musculoskeletal: Normal range of motion.  Cardiovascular:     Rate and Rhythm: Normal rate     Pulses: Normal pulses.  Pulmonary:     Effort: Pulmonary effort is normal. No respiratory distress.  Abdominal:     General: Abdomen is flat. There is no distension.  Musculoskeletal: Normal range of motion.  Skin:    General: Skin is warm and dry.     Findings: No erythema or rash.  Neurological:     General: No focal deficit present.     Mental Status: Alert and oriented to  person, place, and time. Mental status is at baseline.     Motor: No weakness.  Psychiatric:        Mood and Affect: Mood normal.        Behavior: Behavior normal.    Assessment/Plan: The patient is scheduled for immediate bilateral breast reconstruction with placement of tissue expanders and Flex HD with Dr. Marla Roe after bilateral nipple sparing mastectomy by Dr. Lilia Pro. Risks, benefits, and alternatives of procedure discussed, questions answered and consent obtained.    Smoking Status: Quit smoking cigarettes 9 months ago, currently smoking vape with nicotine; Counseling Given?  Discussed increased risk of postoperative complications, recommended quitting vape.  Caprini Score: 7, high; Risk Factors include: Age, history of breast cancer, current vape with nicotine user, on tamoxifen and length of planned surgery. Recommendation for mechanical and pharmacological prophylaxis. Encourage early ambulation.   Pictures obtained:@[email or web address]   Post-op Rx sent to pharmacy: Norco, Zofran, Keflex, Valium. Had a discussion with patient and her husband today in regards to Norco dosing and Valium dosing with other medications that she is currently prescribed.  She currently takes clonazepam for anxiety and hydroxyzine as well.  I discussed with her that she should not take the Valium in conjunction with the clonazepam or hydroxyzine as they all can cause sedation.  She is also aware to not take these with Norco.  Patient and husband agreed and understood.  Patient was provided with the breast reconstruction and tissue  expander/General Surgical Risk consent document and Pain Medication Agreement prior to their appointment.  They had adequate time to read through the risk consent documents and Pain Medication Agreement. We also discussed them in person together during this preop appointment. All of their questions were answered to their satisfaction.  Recommended calling if they have any further questions.  Risk consent form and Pain Medication Agreement to be scanned into patient's chart.  The risks that can be encountered with and after placement of a breast expander placement were discussed and include the following but not limited to these: bleeding, infection, delayed healing, anesthesia risks, skin sensation changes, injury to structures including nerves, blood vessels, and muscles which may be temporary or permanent, allergies to tape, suture materials and glues, blood products, topical preparations or injected agents, skin contour irregularities, skin discoloration and swelling, deep vein thrombosis, cardiac and pulmonary complications, pain, which may persist, fluid accumulation, wrinkling of the skin over the expander, changes in nipple or breast sensation, expander leakage or rupture, faulty position of the expander, persistent pain, formation of tight scar tissue around the expander (capsular contracture), possible need for revisional surgery or staged procedures.  Patient is aware that she is at an increased risk of postoperative complications given the nicotine use with her vape.  She is aware of increased risks of wound healing complications, skin necrosis.   Electronically signed by: Carola Rhine Shamond Skelton, PA-C 03/03/2021 2:45 PM

## 2021-03-03 NOTE — Progress Notes (Signed)
Patient ID: Brianna Merritt, female    DOB: 03-11-1976, 44 y.o.   MRN: [id number]  Chief Complaint  Patient presents with   Pre-op Exam      ICD-10-CM   1. Malignant neoplasm of upper-outer quadrant of right breast in female, estrogen receptor positive ([place])  C50.411    Z17.0       History of Present Illness: Brianna Merritt is a 45 y.o.  female  with a history of right invasive ductal carcinoma.  She presents for preoperative evaluation for upcoming procedure, immediate bilateral breast reconstruction with placement of tissue expanders and Flex HD, scheduled for 03/25/2021 with Dr. Marla Roe after bilateral nipple sparing mastectomies by Dr. Lilia Pro.  Her husband joins [place] today by telephone  The patient has not had problems with anesthesia. No history of DVT/PE.  No family history of DVT/PE.  No family or personal history of bleeding or clotting disorders.  Patient is not currently taking any blood thinners.  No history of CVA/MI.   Summary of Previous Visit: Patient has a history of radiation to her right breast.  This ended in July.  Her preoperative bra size is a B but she says her normal cup size is a C.  PMH Significant for: Anxiety, migraines, asthma (well-controlled, no hospitalizations)  She reports she is currently on tamoxifen.  She reports no recent changes in her health.  No fevers, chills, nausea, vomiting, chest pain, shortness of breath.  She does report that she is anxious for surgery.  Past Medical History: Allergies: Allergies  Allergen Reactions   Ketorolac Itching   Ketorolac Tromethamine Itching   Sumatriptan Other (See Comments) and Anaphylaxis    Stroke risk   Meperidine Hcl     Combative   Meperidine Other (See Comments)    combative Other reaction(s): Other (See Comments) combative    Current Medications:  Current Outpatient Medications:    albuterol (PROVENTIL HFA;VENTOLIN HFA) 108 (90 BASE) MCG/ACT inhaler, Inhale 2 puffs into  the lungs every 6 (six) hours as needed for wheezing or shortness of breath., Disp: , Rfl:    busPIRone (BUSPAR) 30 MG tablet, Take 30 mg by mouth in the morning, at noon, and at bedtime., Disp: , Rfl:    cephALEXin (KEFLEX) 500 MG capsule, Take 1 capsule (500 mg total) by mouth 4 (four) times daily for 5 days., Disp: 20 capsule, Rfl: 0   clonazePAM (KLONOPIN) 0.5 MG tablet, Take 0.5 mg by mouth 3 (three) times daily as needed., Disp: , Rfl:    diazepam (VALIUM) 2 MG tablet, Take 1 tablet (2 mg total) by mouth every 12 (twelve) hours as needed for muscle spasms., Disp: 20 tablet, Rfl: 0   HYDROcodone-acetaminophen (NORCO) 5-325 MG tablet, Take 1 tablet by mouth every 6 (six) hours as needed for up to 5 days for severe pain., Disp: 20 tablet, Rfl: 0   hydrOXYzine (ATARAX/VISTARIL) 50 MG tablet, Take 1-1.5 tablets by mouth at bedtime as needed., Disp: , Rfl:    linaclotide (LINZESS) 72 MCG capsule, Take 1 capsule (72 mcg total) by mouth daily before breakfast., Disp: 30 capsule, Rfl: 11   omeprazole (PRILOSEC) 20 MG capsule, Take 1 capsule (20 mg total) by mouth daily., Disp: 90 capsule, Rfl: 3   ondansetron (ZOFRAN) 4 MG tablet, Take 1 tablet (4 mg total) by mouth every 8 (eight) hours as needed for nausea or vomiting., Disp: 20 tablet, Rfl: 0   tamoxifen (NOLVADEX) 20 MG tablet, Take 1 tablet by  mouth once daily, Disp: 90 tablet, Rfl: 3   diphenhydramine-acetaminophen (TYLENOL PM) 25-500 MG TABS tablet, Take 1 tablet by mouth at bedtime as needed., Disp: , Rfl:   Current Facility-Administered Medications:    0.9 %  sodium chloride infusion, 500 mL, Intravenous, Once, Jackquline Denmark, MD  Past Medical Problems: Past Medical History:  Diagnosis Date   Anxiety    Anxiety    Breast cancer ([place]) 2021   DDD (degenerative disc disease)    Migraine    Seizures ([facility])     Past Surgical History: Past Surgical History:  Procedure Laterality Date   BREAST LUMPECTOMY     CESAREAN SECTION      CHOLECYSTECTOMY     ENDOMETRIAL ABLATION     TUBAL LIGATION      Social History: Social History   Socioeconomic History   Marital status: Married    Spouse name: Barbaraann Rondo   Number of children: 5   Years of education: Not on file   Highest education level: Not on file  Occupational History   Not on file  Tobacco Use   Smoking status: Former    Packs/day: 1.50    Years: 33.00    Pack years: 49.50    Types: Cigarettes    Start date: 08/23/1988    Quit date: 11/21/2020    Years since quitting: 0.2   Smokeless tobacco: Former   Tobacco comments:    also vape at times  Vaping Use   Vaping Use: Some days  Substance and Sexual Activity   Alcohol use: No   Drug use: No   Sexual activity: Yes  Other Topics Concern   Not on file  Social History Narrative   Not on file   Social Determinants of Health   Financial Resource Strain: Not on file  Food Insecurity: Not on file  Transportation Needs: Not on file  Physical Activity: Not on file  Stress: Not on file  Social Connections: Not on file  Intimate Partner Violence: Not on file    Family History: Family History  Problem Relation Age of Onset   Colitis Mother    Colon cancer Paternal Uncle    Diabetes Paternal Grandmother     Review of Systems: Review of Systems  Constitutional: Negative.   Respiratory: Negative.    Cardiovascular: Negative.   Gastrointestinal: Negative.   Neurological: Negative.    Physical Exam: Vital Signs BP 111/74 (BP Location: Left Arm, Patient Position: Sitting, Cuff Size: Small)   Pulse 81   Ht 5' (1.524 m)   Wt 131 lb 9.6 oz (59.7 kg)   SpO2 98%   BMI 25.70 kg/m   Physical Exam Constitutional:      General: Not in acute distress.    Appearance: Normal appearance. Not ill-appearing.  HENT:     Head: Normocephalic and atraumatic.  Eyes:     Pupils: Pupils are equal, round Neck:     Musculoskeletal: Normal range of motion.  Cardiovascular:     Rate and Rhythm: Normal rate     Pulses: Normal pulses.  Pulmonary:     Effort: Pulmonary effort is normal. No respiratory distress.  Abdominal:     General: Abdomen is flat. There is no distension.  Musculoskeletal: Normal range of motion.  Skin:    General: Skin is warm and dry.     Findings: No erythema or rash.  Neurological:     General: No focal deficit present.     Mental Status: Alert and oriented to  person, place, and time. Mental status is at baseline.     Motor: No weakness.  Psychiatric:        Mood and Affect: Mood normal.        Behavior: Behavior normal.    Assessment/Plan: The patient is scheduled for immediate bilateral breast reconstruction with placement of tissue expanders and Flex HD with Dr. Marla Roe after bilateral nipple sparing mastectomy by Dr. Lilia Pro. Risks, benefits, and alternatives of procedure discussed, questions answered and consent obtained.    Smoking Status: Quit smoking cigarettes 9 months ago, currently smoking vape with nicotine; Counseling Given?  Discussed increased risk of postoperative complications, recommended quitting vape.  Caprini Score: 7, high; Risk Factors include: Age, history of breast cancer, current vape with nicotine user, on tamoxifen and length of planned surgery. Recommendation for mechanical and pharmacological prophylaxis. Encourage early ambulation.   Pictures obtained:@[email or web address]   Post-op Rx sent to pharmacy: Norco, Zofran, Keflex, Valium. Had a discussion with patient and her husband today in regards to Norco dosing and Valium dosing with other medications that she is currently prescribed.  She currently takes clonazepam for anxiety and hydroxyzine as well.  I discussed with her that she should not take the Valium in conjunction with the clonazepam or hydroxyzine as they all can cause sedation.  She is also aware to not take these with Norco.  Patient and husband agreed and understood.  Patient was provided with the breast reconstruction and tissue  expander/General Surgical Risk consent document and Pain Medication Agreement prior to their appointment.  They had adequate time to read through the risk consent documents and Pain Medication Agreement. We also discussed them in person together during this preop appointment. All of their questions were answered to their satisfaction.  Recommended calling if they have any further questions.  Risk consent form and Pain Medication Agreement to be scanned into patient's chart.  The risks that can be encountered with and after placement of a breast expander placement were discussed and include the following but not limited to these: bleeding, infection, delayed healing, anesthesia risks, skin sensation changes, injury to structures including nerves, blood vessels, and muscles which may be temporary or permanent, allergies to tape, suture materials and glues, blood products, topical preparations or injected agents, skin contour irregularities, skin discoloration and swelling, deep vein thrombosis, cardiac and pulmonary complications, pain, which may persist, fluid accumulation, wrinkling of the skin over the expander, changes in nipple or breast sensation, expander leakage or rupture, faulty position of the expander, persistent pain, formation of tight scar tissue around the expander (capsular contracture), possible need for revisional surgery or staged procedures.  Patient is aware that she is at an increased risk of postoperative complications given the nicotine use with her vape.  She is aware of increased risks of wound healing complications, skin necrosis.   Electronically signed by: Carola Rhine Shanigua Gibb, PA-C 03/03/2021 2:45 PM

## 2021-03-04 NOTE — Telephone Encounter (Signed)
Called pharmacy back, spoke with Santiago Glad. Advised that Catalina Antigua is aware patient takes Clonazepam. The valium is prescribed for after breast surgery to treat muscle spasms only.

## 2021-03-11 ENCOUNTER — Telehealth: Payer: Self-pay | Admitting: Surgical

## 2021-03-11 NOTE — Telephone Encounter (Signed)
Called patient again to inform her to hold tamoxifen 2 weeks prior to surgery and restart 2 weeks postop.  Received clearance from patient's oncology provider Rosanne Sack, PA-C

## 2021-03-17 ENCOUNTER — Encounter (HOSPITAL_BASED_OUTPATIENT_CLINIC_OR_DEPARTMENT_OTHER): Payer: Self-pay | Admitting: Surgery

## 2021-03-17 ENCOUNTER — Other Ambulatory Visit: Payer: Self-pay

## 2021-03-23 ENCOUNTER — Ambulatory Visit: Payer: Self-pay | Admitting: Surgery

## 2021-03-25 ENCOUNTER — Encounter (HOSPITAL_BASED_OUTPATIENT_CLINIC_OR_DEPARTMENT_OTHER): Payer: Self-pay | Admitting: Surgery

## 2021-03-25 ENCOUNTER — Other Ambulatory Visit: Payer: Self-pay

## 2021-03-25 ENCOUNTER — Observation Stay (HOSPITAL_BASED_OUTPATIENT_CLINIC_OR_DEPARTMENT_OTHER)
Admission: RE | Admit: 2021-03-25 | Discharge: 2021-03-26 | Disposition: A | Discharge status: Home / Self Care | Payer: Medicare Other | Attending: Plastic Surgery | Admitting: Plastic Surgery

## 2021-03-25 ENCOUNTER — Encounter (HOSPITAL_BASED_OUTPATIENT_CLINIC_OR_DEPARTMENT_OTHER)
Admission: RE | Disposition: A | Discharge status: Home / Self Care | Payer: Self-pay | Source: Home / Self Care | Attending: Plastic Surgery

## 2021-03-25 ENCOUNTER — Ambulatory Visit (HOSPITAL_BASED_OUTPATIENT_CLINIC_OR_DEPARTMENT_OTHER): Payer: Medicare Other | Admitting: Anesthesiology

## 2021-03-25 DIAGNOSIS — Z79899 Other long term (current) drug therapy: Secondary | ICD-10-CM | POA: Insufficient documentation

## 2021-03-25 DIAGNOSIS — J45909 Unspecified asthma, uncomplicated: Secondary | ICD-10-CM | POA: Insufficient documentation

## 2021-03-25 DIAGNOSIS — Z87891 Personal history of nicotine dependence: Secondary | ICD-10-CM | POA: Diagnosis not present

## 2021-03-25 DIAGNOSIS — D0512 Intraductal carcinoma in situ of left breast: Secondary | ICD-10-CM | POA: Diagnosis not present

## 2021-03-25 DIAGNOSIS — C50919 Malignant neoplasm of unspecified site of unspecified female breast: Secondary | ICD-10-CM | POA: Diagnosis present

## 2021-03-25 DIAGNOSIS — Z853 Personal history of malignant neoplasm of breast: Secondary | ICD-10-CM | POA: Insufficient documentation

## 2021-03-25 DIAGNOSIS — Z170 Estrogen receptor positive status [ER+]: Secondary | ICD-10-CM

## 2021-03-25 DIAGNOSIS — N6011 Diffuse cystic mastopathy of right breast: Secondary | ICD-10-CM | POA: Insufficient documentation

## 2021-03-25 DIAGNOSIS — C50411 Malignant neoplasm of upper-outer quadrant of right female breast: Secondary | ICD-10-CM | POA: Diagnosis present

## 2021-03-25 HISTORY — PX: NIPPLE SPARING MASTECTOMY: SHX6537

## 2021-03-25 HISTORY — PX: BREAST RECONSTRUCTION WITH PLACEMENT OF TISSUE EXPANDER AND FLEX HD (ACELLULAR HYDRATED DERMIS): SHX6295

## 2021-03-25 LAB — POCT PREGNANCY, URINE: Preg Test, Ur: NEGATIVE

## 2021-03-25 SURGERY — MASTECTOMY, NIPPLE SPARING
Anesthesia: General | Site: Breast | Laterality: Bilateral

## 2021-03-25 MED ORDER — HYDROMORPHONE HCL 1 MG/ML IJ SOLN
0.5000 mg | INTRAMUSCULAR | Status: DC | PRN
  Administered 2021-03-25 (×3): 0.5 mg via INTRAVENOUS

## 2021-03-25 MED ORDER — HYDROMORPHONE HCL 1 MG/ML IJ SOLN
INTRAMUSCULAR | Status: AC
  Filled 2021-03-25: qty 0.5

## 2021-03-25 MED ORDER — CEFAZOLIN SODIUM-DEXTROSE 2-4 GM/100ML-% IV SOLN
2.0000 g | Freq: Three times a day (TID) | INTRAVENOUS | Status: DC
  Administered 2021-03-25 – 2021-03-26 (×2): 2 g via INTRAVENOUS
  Filled 2021-03-25 (×2): qty 100

## 2021-03-25 MED ORDER — CEFAZOLIN SODIUM-DEXTROSE 2-4 GM/100ML-% IV SOLN: 2.0000 g | INTRAVENOUS | Status: AC

## 2021-03-25 MED ORDER — HYDROCODONE-ACETAMINOPHEN 5-325 MG PO TABS
ORAL_TABLET | ORAL | Status: AC
  Filled 2021-03-25: qty 1

## 2021-03-25 MED ORDER — HYDROCODONE-ACETAMINOPHEN 5-325 MG PO TABS
1.0000 | ORAL_TABLET | ORAL | Status: DC | PRN
Start: 2021-03-25 — End: 2021-03-26
  Administered 2021-03-25 – 2021-03-26 (×3): 1 via ORAL
  Filled 2021-03-25 (×2): qty 1

## 2021-03-25 MED ORDER — SUGAMMADEX SODIUM 200 MG/2ML IV SOLN
INTRAVENOUS | Status: DC | PRN
  Administered 2021-03-25: 200 mg via INTRAVENOUS

## 2021-03-25 MED ORDER — ROCURONIUM BROMIDE 100 MG/10ML IV SOLN
INTRAVENOUS | Status: DC | PRN
  Administered 2021-03-25: 10 mg via INTRAVENOUS
  Administered 2021-03-25: 60 mg via INTRAVENOUS

## 2021-03-25 MED ORDER — FENTANYL CITRATE (PF) 100 MCG/2ML IJ SOLN
25.0000 ug | INTRAMUSCULAR | Status: DC | PRN
  Administered 2021-03-25: 50 ug via INTRAVENOUS

## 2021-03-25 MED ORDER — KETAMINE HCL 100 MG/ML IJ SOLN
INTRAMUSCULAR | Status: DC | PRN
  Administered 2021-03-25 (×3): 20 mg via INTRAMUSCULAR

## 2021-03-25 MED ORDER — FENTANYL CITRATE (PF) 100 MCG/2ML IJ SOLN
INTRAMUSCULAR | Status: AC
  Filled 2021-03-25: qty 2

## 2021-03-25 MED ORDER — LIDOCAINE-EPINEPHRINE 1 %-1:100000 IJ SOLN
INTRAMUSCULAR | Status: AC
  Filled 2021-03-25: qty 1

## 2021-03-25 MED ORDER — MIDAZOLAM HCL 5 MG/5ML IJ SOLN
INTRAMUSCULAR | Status: DC | PRN
  Administered 2021-03-25: 2 mg via INTRAVENOUS

## 2021-03-25 MED ORDER — BUPIVACAINE HCL (PF) 0.25 % IJ SOLN
INTRAMUSCULAR | Status: AC
  Filled 2021-03-25: qty 30

## 2021-03-25 MED ORDER — DIPHENHYDRAMINE HCL 12.5 MG/5ML PO ELIX: 12.5000 mg | ORAL_SOLUTION | Freq: Four times a day (QID) | ORAL | Status: DC | PRN

## 2021-03-25 MED ORDER — SODIUM CHLORIDE 0.9 % IV SOLN
INTRAVENOUS | Status: AC
  Filled 2021-03-25: qty 10

## 2021-03-25 MED ORDER — CHLORHEXIDINE GLUCONATE CLOTH 2 % EX PADS: 6.0000 | MEDICATED_PAD | Freq: Once | CUTANEOUS | Status: DC

## 2021-03-25 MED ORDER — DEXAMETHASONE SODIUM PHOSPHATE 4 MG/ML IJ SOLN
INTRAMUSCULAR | Status: DC | PRN
  Administered 2021-03-25: 10 mg via INTRAVENOUS

## 2021-03-25 MED ORDER — DIPHENHYDRAMINE HCL 50 MG/ML IJ SOLN: 12.5000 mg | Freq: Four times a day (QID) | INTRAMUSCULAR | Status: DC | PRN

## 2021-03-25 MED ORDER — ACETAMINOPHEN 500 MG PO TABS
1000.0000 mg | ORAL_TABLET | Freq: Once | ORAL | Status: AC
  Administered 2021-03-25: 1000 mg via ORAL

## 2021-03-25 MED ORDER — ROCURONIUM BROMIDE 10 MG/ML (PF) SYRINGE
PREFILLED_SYRINGE | INTRAVENOUS | Status: AC
  Filled 2021-03-25: qty 10

## 2021-03-25 MED ORDER — DIAZEPAM 2 MG PO TABS
2.0000 mg | ORAL_TABLET | Freq: Two times a day (BID) | ORAL | Status: DC | PRN
  Administered 2021-03-26: 2 mg via ORAL
  Filled 2021-03-25: qty 1

## 2021-03-25 MED ORDER — PROPOFOL 10 MG/ML IV BOLUS
INTRAVENOUS | Status: DC | PRN
  Administered 2021-03-25: 100 mg via INTRAVENOUS
  Administered 2021-03-25: 20 mg via INTRAVENOUS

## 2021-03-25 MED ORDER — ONDANSETRON HCL 4 MG/2ML IJ SOLN
4.0000 mg | Freq: Four times a day (QID) | INTRAMUSCULAR | Status: DC | PRN
  Administered 2021-03-25 (×2): 4 mg via INTRAVENOUS
  Filled 2021-03-25: qty 2

## 2021-03-25 MED ORDER — ONDANSETRON HCL 4 MG/2ML IJ SOLN
INTRAMUSCULAR | Status: AC
  Filled 2021-03-25: qty 2

## 2021-03-25 MED ORDER — KETAMINE HCL 100 MG/ML IJ SOLN
INTRAMUSCULAR | Status: AC
  Filled 2021-03-25: qty 1

## 2021-03-25 MED ORDER — CEFAZOLIN SODIUM-DEXTROSE 2-4 GM/100ML-% IV SOLN
2.0000 g | INTRAVENOUS | Status: AC
  Administered 2021-03-25: 2 g via INTRAVENOUS

## 2021-03-25 MED ORDER — FENTANYL CITRATE (PF) 100 MCG/2ML IJ SOLN
INTRAMUSCULAR | Status: DC | PRN
  Administered 2021-03-25 (×2): 50 ug via INTRAVENOUS
  Administered 2021-03-25: 100 ug via INTRAVENOUS

## 2021-03-25 MED ORDER — SENNA 8.6 MG PO TABS: 1.0000 | ORAL_TABLET | Freq: Two times a day (BID) | ORAL | Status: DC

## 2021-03-25 MED ORDER — 0.9 % SODIUM CHLORIDE (POUR BTL) OPTIME
TOPICAL | Status: DC | PRN
  Administered 2021-03-25: 1000 mL

## 2021-03-25 MED ORDER — CEFAZOLIN SODIUM-DEXTROSE 2-4 GM/100ML-% IV SOLN
INTRAVENOUS | Status: AC
  Filled 2021-03-25: qty 100

## 2021-03-25 MED ORDER — MIDAZOLAM HCL 2 MG/2ML IJ SOLN
INTRAMUSCULAR | Status: AC
  Filled 2021-03-25: qty 2

## 2021-03-25 MED ORDER — LIDOCAINE HCL (CARDIAC) PF 100 MG/5ML IV SOSY
PREFILLED_SYRINGE | INTRAVENOUS | Status: DC | PRN
  Administered 2021-03-25: 50 mg via INTRAVENOUS

## 2021-03-25 MED ORDER — ACETAMINOPHEN 325 MG PO TABS
325.0000 mg | ORAL_TABLET | Freq: Four times a day (QID) | ORAL | Status: DC
  Administered 2021-03-25 – 2021-03-26 (×3): 325 mg via ORAL
  Filled 2021-03-25 (×3): qty 1

## 2021-03-25 MED ORDER — METHOCARBAMOL 500 MG PO TABS
500.0000 mg | ORAL_TABLET | Freq: Four times a day (QID) | ORAL | Status: DC | PRN
  Administered 2021-03-25 – 2021-03-26 (×2): 500 mg via ORAL
  Filled 2021-03-25 (×2): qty 1

## 2021-03-25 MED ORDER — PROPOFOL 10 MG/ML IV BOLUS
INTRAVENOUS | Status: AC
  Filled 2021-03-25: qty 20

## 2021-03-25 MED ORDER — HYDROMORPHONE HCL 1 MG/ML IJ SOLN
1.0000 mg | INTRAMUSCULAR | Status: DC | PRN
  Administered 2021-03-25: 1 mg via INTRAVENOUS
  Filled 2021-03-25: qty 1

## 2021-03-25 MED ORDER — SODIUM CHLORIDE 0.9 % IV SOLN
INTRAVENOUS | Status: DC | PRN
  Administered 2021-03-25: 500 mL

## 2021-03-25 MED ORDER — ONDANSETRON HCL 4 MG/2ML IJ SOLN
INTRAMUSCULAR | Status: DC | PRN
  Administered 2021-03-25: 4 mg via INTRAVENOUS

## 2021-03-25 MED ORDER — LACTATED RINGERS IV SOLN: INTRAVENOUS | Status: DC

## 2021-03-25 MED ORDER — SODIUM CHLORIDE 0.9 % IV SOLN
INTRAVENOUS | Status: AC | PRN
  Administered 2021-03-25: 100 mL

## 2021-03-25 MED ORDER — ZOLPIDEM TARTRATE 5 MG PO TABS
5.0000 mg | ORAL_TABLET | Freq: Every evening | ORAL | Status: DC | PRN
  Administered 2021-03-25: 5 mg via ORAL
  Filled 2021-03-25: qty 1

## 2021-03-25 MED ORDER — ACETAMINOPHEN 500 MG PO TABS
ORAL_TABLET | ORAL | Status: AC
  Filled 2021-03-25: qty 2

## 2021-03-25 MED ORDER — POLYETHYLENE GLYCOL 3350 17 G PO PACK: 17.0000 g | PACK | Freq: Every day | ORAL | Status: DC | PRN

## 2021-03-25 MED ORDER — NITROGLYCERIN 2 % TD OINT
TOPICAL_OINTMENT | TRANSDERMAL | Status: AC
  Filled 2021-03-25: qty 30

## 2021-03-25 MED ORDER — EPHEDRINE SULFATE 50 MG/ML IJ SOLN
INTRAMUSCULAR | Status: DC | PRN
  Administered 2021-03-25 (×2): 10 mg via INTRAVENOUS

## 2021-03-25 MED ORDER — KCL IN DEXTROSE-NACL 20-5-0.45 MEQ/L-%-% IV SOLN
INTRAVENOUS | Status: DC
  Filled 2021-03-25: qty 1000

## 2021-03-25 MED ORDER — ONDANSETRON 4 MG PO TBDP
4.0000 mg | ORAL_TABLET | Freq: Four times a day (QID) | ORAL | Status: DC | PRN
  Administered 2021-03-26: 4 mg via ORAL
  Filled 2021-03-25: qty 1

## 2021-03-25 SURGICAL SUPPLY — 84 items
BAG DECANTER FOR FLEXI CONT (MISCELLANEOUS) ×2 IMPLANT
BENZOIN TINCTURE PRP APPL 2/3 (GAUZE/BANDAGES/DRESSINGS) ×2 IMPLANT
BINDER BREAST LRG (GAUZE/BANDAGES/DRESSINGS) ×2 IMPLANT
BINDER BREAST XLRG (GAUZE/BANDAGES/DRESSINGS) IMPLANT
BINDER BREAST XXLRG (GAUZE/BANDAGES/DRESSINGS) IMPLANT
BIOPATCH RED 1 DISK 7.0 (GAUZE/BANDAGES/DRESSINGS) ×2 IMPLANT
BLADE CLIPPER SURG (BLADE) IMPLANT
BLADE HEX COATED 2.75 (ELECTRODE) ×2 IMPLANT
BLADE SURG 10 STRL SS (BLADE) ×2 IMPLANT
BLADE SURG 15 STRL LF DISP TIS (BLADE) ×1 IMPLANT
BLADE SURG 15 STRL SS (BLADE) ×1
BNDG ELASTIC 6X5.8 VLCR STR LF (GAUZE/BANDAGES/DRESSINGS) IMPLANT
BNDG GAUZE ELAST 4 BULKY (GAUZE/BANDAGES/DRESSINGS) IMPLANT
CANISTER SUCT 1200ML W/VALVE (MISCELLANEOUS) ×2 IMPLANT
CHLORAPREP W/TINT 26 (MISCELLANEOUS) IMPLANT
COVER BACK TABLE 60X90IN (DRAPES) ×2 IMPLANT
COVER MAYO STAND STRL (DRAPES) ×4 IMPLANT
DECANTER SPIKE VIAL GLASS SM (MISCELLANEOUS) ×2 IMPLANT
DERMABOND ADVANCED (GAUZE/BANDAGES/DRESSINGS) ×2
DERMABOND ADVANCED .7 DNX12 (GAUZE/BANDAGES/DRESSINGS) ×2 IMPLANT
DRAIN CHANNEL 19F RND (DRAIN) ×4 IMPLANT
DRAPE LAPAROSCOPIC ABDOMINAL (DRAPES) ×2 IMPLANT
DRAPE UTILITY XL STRL (DRAPES) ×2 IMPLANT
DRSG OPSITE POSTOP 4X6 (GAUZE/BANDAGES/DRESSINGS) ×4 IMPLANT
DRSG PAD ABDOMINAL 8X10 ST (GAUZE/BANDAGES/DRESSINGS) ×4 IMPLANT
ELECT BLADE 4.0 EZ CLEAN MEGAD (MISCELLANEOUS) ×2
ELECT COATED BLADE 2.86 ST (ELECTRODE) ×2 IMPLANT
ELECT REM PT RETURN 9FT ADLT (ELECTROSURGICAL) ×2
ELECTRODE BLDE 4.0 EZ CLN MEGD (MISCELLANEOUS) ×1 IMPLANT
ELECTRODE REM PT RTRN 9FT ADLT (ELECTROSURGICAL) ×1 IMPLANT
EVACUATOR SILICONE 100CC (DRAIN) ×4 IMPLANT
GAUZE SPONGE 4X4 12PLY STRL LF (GAUZE/BANDAGES/DRESSINGS) IMPLANT
GLOVE SURG ENC MOIS LTX SZ6 (GLOVE) ×4 IMPLANT
GLOVE SURG ENC MOIS LTX SZ7 (GLOVE) ×2 IMPLANT
GLOVE SURG UNDER POLY LF SZ7.5 (GLOVE) ×2 IMPLANT
GOWN STRL REUS W/ TWL LRG LVL3 (GOWN DISPOSABLE) ×3 IMPLANT
GOWN STRL REUS W/TWL LRG LVL3 (GOWN DISPOSABLE) ×3
GRAFT FLEX HD 6X16 PLIABLE (Tissue) ×4 IMPLANT
HEMOSTAT ARISTA ABSORB 3G PWDR (HEMOSTASIS) IMPLANT
IMPL BREAST 300CC (Breast) ×2 IMPLANT
IMPLANT BREAST 300CC (Breast) ×4 IMPLANT
IV NS 1000ML (IV SOLUTION) ×1
IV NS 1000ML BAXH (IV SOLUTION) ×1 IMPLANT
IV NS 500ML (IV SOLUTION) ×1
IV NS 500ML BAXH (IV SOLUTION) ×1 IMPLANT
KIT FILL ASEPTIC TRANSFER (MISCELLANEOUS) ×4 IMPLANT
KIT FILL SYSTEM UNIVERSAL (SET/KITS/TRAYS/PACK) ×2 IMPLANT
NDL SAFETY ECLIPSE 18X1.5 (NEEDLE) IMPLANT
NEEDLE HYPO 18GX1.5 SHARP (NEEDLE)
NEEDLE HYPO 25X1 1.5 SAFETY (NEEDLE) IMPLANT
NS IRRIG 1000ML POUR BTL (IV SOLUTION) ×2 IMPLANT
PACK BASIN DAY SURGERY FS (CUSTOM PROCEDURE TRAY) ×2 IMPLANT
PAD FOAM SILICONE BACKED (GAUZE/BANDAGES/DRESSINGS) IMPLANT
PENCIL SMOKE EVACUATOR (MISCELLANEOUS) ×2 IMPLANT
PIN SAFETY STERILE (MISCELLANEOUS) ×2 IMPLANT
SHEET MEDIUM DRAPE 40X70 STRL (DRAPES) ×2 IMPLANT
SLEEVE SCD COMPRESS KNEE MED (STOCKING) ×2 IMPLANT
SPONGE T-LAP 18X18 ~~LOC~~+RFID (SPONGE) ×4 IMPLANT
STAPLER VISISTAT 35W (STAPLE) IMPLANT
STRIP SUTURE WOUND CLOSURE 1/2 (MISCELLANEOUS) ×4 IMPLANT
SUT ETHILON 2 0 FS 18 (SUTURE) IMPLANT
SUT MNCRL AB 4-0 PS2 18 (SUTURE) ×6 IMPLANT
SUT MON AB 3-0 SH 27 (SUTURE) ×1
SUT MON AB 3-0 SH27 (SUTURE) ×1 IMPLANT
SUT MON AB 5-0 PS2 18 (SUTURE) ×2 IMPLANT
SUT PDS 3-0 CT2 (SUTURE) ×12
SUT PDS AB 2-0 CT2 27 (SUTURE) IMPLANT
SUT PDS II 3-0 CT2 27 ABS (SUTURE) ×6 IMPLANT
SUT PROLENE 3 0 PS 1 (SUTURE) IMPLANT
SUT SILK 0 TIES 10X30 (SUTURE) IMPLANT
SUT SILK 2 0 PERMA HAND 18 BK (SUTURE) IMPLANT
SUT SILK 2 0 SH (SUTURE) ×4 IMPLANT
SUT SILK 3 0 PS 1 (SUTURE) ×4 IMPLANT
SUT VIC AB 2-0 CT1 27 (SUTURE)
SUT VIC AB 2-0 CT1 TAPERPNT 27 (SUTURE) IMPLANT
SUT VICRYL 3-0 CR8 SH (SUTURE) ×2 IMPLANT
SYR BULB IRRIG 60ML STRL (SYRINGE) ×2 IMPLANT
SYR CONTROL 10ML LL (SYRINGE) IMPLANT
TOWEL GREEN STERILE FF (TOWEL DISPOSABLE) ×4 IMPLANT
TRAY DSU PREP LF (CUSTOM PROCEDURE TRAY) ×2 IMPLANT
TRAY FOLEY W/BAG SLVR 14FR LF (SET/KITS/TRAYS/PACK) ×2 IMPLANT
TUBE CONNECTING 20X1/4 (TUBING) ×2 IMPLANT
UNDERPAD 30X36 HEAVY ABSORB (UNDERPADS AND DIAPERS) ×4 IMPLANT
YANKAUER SUCT BULB TIP NO VENT (SUCTIONS) ×2 IMPLANT

## 2021-03-25 NOTE — Anesthesia Preprocedure Evaluation (Addendum)
Anesthesia Evaluation  Patient identified by MRN, date of birth, ID band Patient awake    Reviewed: Allergy & Precautions, NPO status , Patient's Chart, lab work & pertinent test results  Airway Mallampati: I  TM Distance: >3 FB Neck ROM: Full    Dental  (+) Edentulous Upper, Edentulous Lower, Dental Advisory Given   Pulmonary neg pulmonary ROS, Patient abstained from smoking., former smoker,    Pulmonary exam normal breath sounds clear to auscultation       Cardiovascular negative cardio ROS Normal cardiovascular exam Rhythm:Regular Rate:Normal     Neuro/Psych  Headaches, Seizures -, Well Controlled,  PSYCHIATRIC DISORDERS Anxiety Depression Bipolar Disorder    GI/Hepatic Neg liver ROS, GERD  Medicated and Controlled,  Endo/Other  negative endocrine ROS  Renal/GU negative Renal ROS  negative genitourinary   Musculoskeletal  (+) Arthritis ,   Abdominal   Peds  Hematology negative hematology ROS (+)   Anesthesia Other Findings Right breast CA  Reproductive/Obstetrics                           Anesthesia Physical Anesthesia Plan  ASA: 2  Anesthesia Plan: General   Post-op Pain Management:    Induction: Intravenous  PONV Risk Score and Plan: 3 and Midazolam, Dexamethasone and Ondansetron  Airway Management Planned: Oral ETT  Additional Equipment:   Intra-op Plan:   Post-operative Plan: Extubation in OR  Informed Consent: I have reviewed the patients History and Physical, chart, labs and discussed the procedure including the risks, benefits and alternatives for the proposed anesthesia with the patient or authorized representative who has indicated his/her understanding and acceptance.     Dental advisory given  Plan Discussed with: CRNA  Anesthesia Plan Comments:         Anesthesia Quick Evaluation

## 2021-03-25 NOTE — Anesthesia Postprocedure Evaluation (Signed)
Anesthesia Post Note  Patient: Tarnesha A. Berhe  Procedure(s) Performed: BILATERAL SIMPLE NIPPLE SPARING MASTECTOMY (Bilateral: Breast) BILATERAL BREAST RECONSTRUCTION WITH PLACEMENT OF TISSUE EXPANDER AND FLEX HD (ACELLULAR HYDRATED DERMIS) (Bilateral: Breast)     Patient location during evaluation: PACU Anesthesia Type: General Level of consciousness: awake and alert Pain management: pain level controlled Vital Signs Assessment: post-procedure vital signs reviewed and stable Respiratory status: spontaneous breathing, nonlabored ventilation, respiratory function stable and patient connected to nasal cannula oxygen Cardiovascular status: blood pressure returned to baseline and stable Postop Assessment: no apparent nausea or vomiting Anesthetic complications: no   No notable events documented.  Last Vitals:  Vitals:   03/25/21 1530 03/25/21 1545  BP: 115/69 119/69  Pulse: 84 66  Resp: 15 16  Temp:    SpO2: 94% 99%    Last Pain:  Vitals:   03/25/21 1545  TempSrc:   PainSc: 8                  Mylene Bow L Elesia Pemberton

## 2021-03-25 NOTE — Interval H&P Note (Signed)
History and Physical Interval Note:  03/25/2021 12:17 PM  Brianna Merritt  has presented today for surgery, with the diagnosis of MALIGNANT NEOPLASM OF UPPER-OUTER QUADRANT OF RIGHT BREAST IN FEMALE, ESTROGEN RECEPTOR POSITIVE.  The various methods of treatment have been discussed with the patient and family. After consideration of risks, benefits and other options for treatment, the patient has consented to  Procedure(s): BILATERAL SIMPLE NIPPLE SPARING MASTECTOMY (Bilateral) BILATERAL BREAST RECONSTRUCTION WITH PLACEMENT OF TISSUE EXPANDER AND FLEX HD (ACELLULAR HYDRATED DERMIS) (Bilateral) as a surgical intervention.  The patient's history has been reviewed, patient examined, no change in status, stable for surgery.  I have reviewed the patient's chart and labs.  Questions were answered to the patient's satisfaction.     Loel Lofty Adin Laker

## 2021-03-25 NOTE — Op Note (Signed)
OPERATIVE NOTE     Brianna Merritt [birth year] [id number] 03/25/2021   Preoperative diagnosis: Increased risk for breast cancer due to genetic mutation  Postoperative diagnosis: Same  Procedure: Bilateral simple prophylactic nipple sparing mastectomies  Surgeon: Pollyann Samples  Assistant surgeon: None  Anesthesia:General  Estimated blood loss: 20 cc  Clinical History and Indications: This patient has a history of right breast cancer treated by breast conserving surgery including lumpectomy and radiation.  She has a Chek 2 mutation and is interested in prophylactic mastectomies.  The patient was seen in the holding area and we reviewed the plans for the procedure as noted above. We reviewed the risks and complications a final time. She had no further questions.   Description of Procedure: The patient was taken to the operating room. After satisfactory general anesthesia was obtained the timeout was done. .  The entire chest was prepped with Betadine and draped with sterile linen.  I discussed the placement of the incisions preoperatively with Dr. Marla Roe.  I began on the left side.  An incision was made beginning at the lateral edge of the nipple areolar complex, extending laterally for 7 cm.  Skin flaps were developed anteriorly using the electrocautery.  Initially I developed the superior and inferior flaps.  Next the retroareolar dissection was performed.  The site was marked on the specimen with a silk suture.  The anterior flap was completed, taking care to preserve the inframammary fold and the medial border.  The breast was dissected off the chest wall using the electrocautery, leaving the pectoralis fascia intact.  Hemostasis was assured.  Next, attention was turned to the right side where a similar procedure was performed.  The procedure was turned over to Dr. Marla Roe for reconstruction.  03/25/2021 1:04 PM

## 2021-03-25 NOTE — Anesthesia Procedure Notes (Signed)
Procedure Name: Intubation Date/Time: 03/25/2021 11:01 AM Performed by: Glory Buff, CRNA Pre-anesthesia Checklist: Patient identified, Emergency Drugs available, Suction available and Patient being monitored Patient Re-evaluated:Patient Re-evaluated prior to induction Oxygen Delivery Method: Circle system utilized Preoxygenation: Pre-oxygenation with 100% oxygen Induction Type: IV induction Ventilation: Mask ventilation without difficulty Laryngoscope Size: Miller and 3 Grade View: Grade I Tube type: Oral Tube size: 7.0 mm Number of attempts: 1 Airway Equipment and Method: Stylet and Oral airway Placement Confirmation: ETT inserted through vocal cords under direct vision, positive ETCO2 and breath sounds checked- equal and bilateral Secured at: 20 cm Tube secured with: Tape Dental Injury: Teeth and Oropharynx as per pre-operative assessment

## 2021-03-25 NOTE — Transfer of Care (Signed)
Immediate Anesthesia Transfer of Care Note  Patient: Brianna Merritt  Procedure(s) Performed: BILATERAL SIMPLE NIPPLE SPARING MASTECTOMY (Bilateral: Breast) BILATERAL BREAST RECONSTRUCTION WITH PLACEMENT OF TISSUE EXPANDER AND FLEX HD (ACELLULAR HYDRATED DERMIS) (Bilateral: Breast)  Patient Location: PACU  Anesthesia Type:General  Level of Consciousness: awake, alert  and oriented  Airway & Oxygen Therapy: Patient Spontanous Breathing and Patient connected to face mask oxygen  Post-op Assessment: Report given to RN and Post -op Vital signs reviewed and stable  Post vital signs: Reviewed and stable  Last Vitals:  Vitals Value Taken Time  BP 119/58 03/25/21 1448  Temp 37.1 C 03/25/21 1448  Pulse 98 03/25/21 1453  Resp 17 03/25/21 1453  SpO2 100 % 03/25/21 1453  Vitals shown include unvalidated device data.  Last Pain:  Vitals:   03/25/21 0850  TempSrc: Oral  PainSc: 0-No pain      Patients Stated Pain Goal: 1 ([phone] [id number])  Complications: No notable events documented.

## 2021-03-25 NOTE — Op Note (Signed)
Op report    DATE OF OPERATION:  03/25/2021  LOCATION: [place]  SURGICAL DIVISION: Plastic Surgery  PREOPERATIVE DIAGNOSES:  1. Breast cancer.    POSTOPERATIVE DIAGNOSES:  1. Breast cancer.   PROCEDURE:  1. Bilateral immediate breast reconstruction with placement of Acellular Dermal Matrix and tissue expanders.  SURGEON: Nour Rodrigues Sanger Johnelle Tafolla, DO  ASSISTANT: Roetta Sessions, PA  ANESTHESIA:  General.   COMPLICATIONS: None.   IMPLANTS: Left - Mentor 300 cc. Ref #[id number].  Serial Number [id number], 100 cc of injectable saline placed in the expander. Right - Mentor 300 cc. Ref #[id number].  Serial Number [id number], 100 cc of injectable saline placed in the expander. Acellular Dermal Matrix 6  x 16 cm Flex HD x two  INDICATIONS FOR PROCEDURE:  The patient, Brianna Merritt, is a 45 y.o. female born on [birth year], is here for  immediate first stage breast reconstruction with placement of bilateral tissue expander and Acellular dermal matrix. MRN: [id number]  CONSENT:  Informed consent was obtained directly from the patient. Risks, benefits and alternatives were fully discussed. Specific risks including but not limited to bleeding, infection, hematoma, seroma, scarring, pain, implant infection, implant extrusion, capsular contracture, asymmetry, wound healing problems, and need for further surgery were all discussed. The patient did have an ample opportunity to have her questions answered to her satisfaction.   DESCRIPTION OF PROCEDURE:  The patient was taken to the operating room by the general surgery team. SCDs were placed and IV antibiotics were given. The patient's chest was prepped and draped in a sterile fashion. A time out was performed and the implants to be used were identified.  Bilateral mastectomies were performed.  Once the general surgery team had completed their portion of the case the patient was rendered to the plastic and  reconstructive surgery team.  Left:  The pectoralis major muscle was lifted from the chest wall with release of the lateral edge and lateral inframammary fold.  The pocket was irrigated with antibiotic solution and hemostasis was achieved with electrocautery.  The ADM was then prepared according to the manufacture guidelines and slits placed to help with postoperative fluid management.  The ADM was then sutured to the inferior and lateral edge of the inframammary fold with 2-0 PDS starting with an interrupted stitch and then a running stitch.  The lateral portion was sutured to with interrupted sutures after the expander was placed.  The expander was prepared according to the manufacture guidelines, the air evacuated and then it was placed under the ADM and pectoralis major muscle.  The inferior and lateral tabs were used to secure the expander to the chest wall with 2-0 PDS.  The drain was placed at the inframammary fold over the ADM and secured to the skin with 3-0 Silk.  The deep layers were closed with 3-0 Monocryl followed by 4-0 Monocryl.  The skin was closed with 5-0 Monocryl and then dermabond was applied.    Right:  The pectoralis major muscle was lifted from the chest wall with release of the lateral edge and lateral inframammary fold.  The pocket was irrigated with antibiotic solution and hemostasis was achieved with electrocautery.  The ADM was then prepared according to the manufacture guidelines and slits placed to help with postoperative fluid management.  The ADM was then sutured to the inferior and lateral edge of the inframammary fold with 2-0 PDS starting with an interrupted stitch and then a running stitch.  The lateral portion was sutured to with  interrupted sutures after the expander was placed.  The expander was prepared according to the manufacture guidelines, the air evacuated and then it was placed under the ADM and pectoralis major muscle.  The inferior and lateral tabs were used to  secure the expander to the chest wall with 2-0 PDS.  The drain was placed at the inframammary fold over the ADM and secured to the skin with 3-0 Silk.  The deep layers were closed with 3-0 Monocryl followed by 4-0 Monocryl.  The skin was closed with 5-0 Monocryl and then dermabond was applied.  The ABDs and breast binder were placed.  The patient tolerated the procedure well and there were no complications.  The patient was allowed to wake from anesthesia and taken to the recovery room in satisfactory condition.   The advanced practice practitioner (APP) assisted throughout the case.  The APP was essential in retraction and counter traction when needed to make the case progress smoothly.  This retraction and assistance made it possible to see the tissue plans for the procedure.  The assistance was needed for blood control, tissue re-approximation and assisted with closure of the incision site.

## 2021-03-25 NOTE — Interval H&P Note (Signed)
History and Physical Interval Note:  03/25/2021 10:42 AM  Brianna Merritt  has presented today for surgery, with the diagnosis of increased risk for breast cancer due to genetic mutation and personal history.  The various methods of treatment have been discussed with the patient and family. After consideration of risks, benefits and other options for treatment, the patient has consented to  Procedure(s): BILATERAL SIMPLE NIPPLE SPARING MASTECTOMY (Bilateral) BILATERAL BREAST RECONSTRUCTION WITH PLACEMENT OF TISSUE EXPANDER AND FLEX HD (ACELLULAR HYDRATED DERMIS) (Bilateral) as a surgical intervention.  The patient's history has been reviewed, patient examined, no change in status, stable for surgery.  I have reviewed the patient's chart and labs.  Questions were answered to the patient's satisfaction.     Pollyann Samples

## 2021-03-25 NOTE — H&P (Signed)
Subjective: Patient is a 45 y.o. female who has a history of breast cancer.  She had a stage I right breast cancer treated by breast conserving therapy within the past couple of years.  She had a lumpectomy, sentinel lymph node biopsy, and postoperative radiation.  She is on tamoxifen.  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  She has also required numerous imaging studies and biopsies since her prior diagnosis.  She is very interested in prophylactic mastectomy because of her increased risk.  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   Hypokalemia 07/25/2015   Epilepsy ([place]) 07/25/2015   Syncope and collapse 07/25/2015   Abdominal pain of unknown etiology 12/14/2011   Panic attacks 03/19/2011   Migraine 03/19/2011   Depression 03/19/2011   Seizure ([place]) 03/19/2011   Past Medical History:  Diagnosis Date   Anxiety    Anxiety    Breast cancer ([place]) 2021   DDD (degenerative disc disease)    Migraine    Seizures ([place])    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    Past Surgical History:  Procedure Laterality Date   BREAST LUMPECTOMY     CESAREAN SECTION     CHOLECYSTECTOMY     ENDOMETRIAL ABLATION     TUBAL LIGATION      Current Facility-Administered Medications  Medication Dose Route Frequency Provider Last Rate Last Admin   ceFAZolin (ANCEF) IVPB 2g/100 mL premix  2 g Intravenous On Call to OR Dillingham, Loel Lofty, DO       ceFAZolin (ANCEF) IVPB 2g/100 mL premix  2 g Intravenous On Call to OR Pollyann Samples, MD       Chlorhexidine Gluconate Cloth 2 % PADS 6 each  6  each Topical Once Dillingham, Loel Lofty, DO       And   Chlorhexidine Gluconate Cloth 2 % PADS 6 each  6 each Topical Once Dillingham, Claire S, DO       Chlorhexidine Gluconate Cloth 2 % PADS 6 each  6 each Topical Once Pollyann Samples, MD       And   Chlorhexidine Gluconate Cloth 2 % PADS 6 each  6 each Topical Once Pollyann Samples, MD       lactated ringers infusion   Intravenous Continuous Janeece Riggers, MD 10 mL/hr at 03/25/21 1007 Continued from Pre-op at 03/25/21 1007   Allergies  Allergen Reactions   Ketorolac Itching   Ketorolac Tromethamine Itching   Sumatriptan Other (See Comments) and Anaphylaxis    Stroke risk   Meperidine Hcl     Combative   Meperidine Other (See Comments)    combative Other reaction(s): Other (See Comments) combative    Social History   Tobacco Use   Smoking status: Former    Packs/day: 1.50    Years: 33.00    Pack years: 49.50    Types: Cigarettes    Start date: 08/23/1988    Quit date: 11/21/2020    Years since quitting: 0.3   Smokeless tobacco: Former   Tobacco comments:    also vape at times  Substance Use Topics  Alcohol use: No    Family History  Problem Relation Age of Onset   Colitis Mother    Colon cancer Paternal Uncle    Diabetes Paternal Grandmother     Review of Systems A comprehensive review of systems was negative.  Objective: Vital signs in last 24 hours: Temp:  [97.8 F (36.6 C)] 97.8 F (36.6 C) (08/03 0850) Pulse Rate:  [55] 55 (08/03 0850) Resp:  [20] 20 (08/03 0850) BP: (94)/(58) 94/58 (08/03 0850) SpO2:  [100 %] 100 % (08/03 0850) Weight:  [59.4 kg] 59.4 kg (08/03 0850)  General appearance: alert and cooperative Head: Normocephalic, without obvious abnormality, atraumatic Neck: no adenopathy, no carotid bruit, no JVD, supple, symmetrical, trachea midline, and thyroid not enlarged, symmetric, no tenderness/mass/nodules Lungs: clear to auscultation bilaterally Breasts: normal appearance, no masses or  tenderness Heart: regular rate and rhythm, S1, S2 normal, no murmur, click, rub or gallop Extremities: extremities normal, atraumatic, no cyanosis or edema Skin: Skin color, texture, turgor normal. No rashes or lesions Lymph nodes: Cervical, supraclavicular, and axillary nodes normal.  Assessment/Plan:  Increased risk for breast cancer due to personal history and genetic mutation  Options for management were discussed with the patient.  She has decided to proceed with bilateral prophylactic mastectomy.  This will be a nipple sparing procedure performed in conjunction with plastic surgery for immediate reconstruction.  Risk benefits and alternatives were extensively discussed in detail the patient understands and would like to proceed.

## 2021-03-25 NOTE — Discharge Instructions (Signed)
INSTRUCTIONS FOR AFTER SURGERY   You will likely have some questions about what to expect following your operation.  The following information will help you and your family understand what to expect when you are discharged from the hospital.  Following these guidelines will help ensure a smooth recovery and reduce risks of complications.  Postoperative instructions include information on: diet, wound care, medications and physical activity.  AFTER SURGERY Expect to go home after the procedure.  In some cases, you may need to spend one night in the hospital for observation.  DIET This surgery does not require a specific diet.  However, I have to mention that the healthier you eat the better your body can start healing. It is important to increasing your protein intake.  This means limiting the foods with added sugar.  Focus on fruits and vegetables and some meat. It is very important to drink water after your surgery.  If your urine is bright yellow, then it is concentrated, and you need to drink more water.  As a general rule after surgery, you should have 8 ounces of water every hour while awake.  If you find you are persistently nauseated or unable to take in liquids let us know.  NO TOBACCO USE or EXPOSURE.  This will slow your healing process and increase the risk of a wound.  WOUND CARE If you have a drain: Clean with baby wipes for 3-5 days and then you can shower.  If you have a binder you may remove it to shower and then put it back on. If you have steri-strips / tape directly attached to your skin leave them in place. It is OK to get these wet.  No baths, pools or hot tubs for two weeks. We close your incision to leave the smallest and best-looking scar. No ointment or creams on your incisions until given the go ahead.  Especially not Neosporin (Too many skin reactions with this one).  A few weeks after surgery you can use Mederma and start massaging the scar. We ask you to wear your binder or  sports bra for the first 6 weeks around the clock, including while sleeping. This provides added comfort and helps reduce the fluid accumulation at the surgery site.  ACTIVITY No heavy lifting until cleared by the doctor.  It is OK to walk and climb stairs. In fact, moving your legs is very important to decrease your risk of a blood clot.  It will also help keep you from getting deconditioned.  Every 1 to 2 hours get up and walk for 5 minutes. This will help with a quicker recovery back to normal.  Let pain be your guide so you don't do too much.  NO, you cannot do the spring cleaning and don't plan on taking care of anyone else.  This is your time for TLC.   WORK Everyone returns to work at different times. As a rough guide, most people take at least 1 - 2 weeks off prior to returning to work. If you need documentation for your job, bring the forms to your postoperative follow up visit.  DRIVING Arrange for someone to bring you home from the hospital.  You may be able to drive a few days after surgery but not while taking any narcotics or valium.  BOWEL MOVEMENTS Constipation can occur after anesthesia and while taking pain medication.  It is important to stay ahead for your comfort.  We recommend taking Milk of Magnesia (2 tablespoons; twice   a day) while taking the pain pills.  SEROMA This is fluid your body tried to put in the surgical site.  This is normal but if it creates excessive pain and swelling let us know.  It usually decreases in a few weeks.  MEDICATIONS and PAIN CONTROL At your preoperative visit for you history and physical you were given the following medications: An antibiotic: Start this medication when you get home and take according to the instructions on the bottle. Zofran 4 mg:  This is to treat nausea and vomiting.  You can take this every 6 hours as needed and only if needed. Norco (hydrocodone/acetaminophen) 5/325 mg:  This is only to be used after you have taken the  motrin or the tylenol. Every 8 hours as needed. Over the counter Medication to take: Ibuprofen (Motrin) 600 mg:  Take this every 6 hours.  If you have additional pain then take 500 mg of the tylenol.  Only take the Norco after you have tried these two. Miralax or stool softener of choice: Take this according to the bottle if you take the Norco.  WHEN TO CALL Call your surgeon's office if any of the following occur:  Fever 101 degrees F or greater  Excessive bleeding or fluid from the incision site.  Pain that increases over time without aid from the medications  Redness, warmth, or pus draining from incision sites  Persistent nausea or inability to take in liquids  Severe misshapen area that underwent the operation.  [facility]  What is the benefit of having a drain?  During surgery your tissue layers are separated.  This raw surface stimulates your body to fill the space with serous fluid.  This is normal but you don't want that fluid to collect and prevent healing.  A fluid collection can also become infected.  The Jackson-Pratt (JP) drain is used to eliminate this collection of fluid and allow the tissue to heal together.    Jackson-Pratt (JP) bulb    How to care for your drainage and suction unit at home Your drainage catheter will be connected to a collection device. The vacuum caused when the device is compressed allows drainage to collect in the device.    Wash your hands with soap and water before and after touching the system. Empty the JP drain every 12 hours once you get home from your procedure. Record the fluid amount on the record sheet included. Start with stripping the drain tube to push the clots or excess fluid to the bulb.  Do this by pinching the tube with one hand near your skin.  Then with the other hand squeeze the tubing and work it toward the bulb.  This should be done several times a day.  This may collapse the tube which will correct on its  own.   Use a safety pin to attach your collection device to your clothing so there is no tension on the insertion site.   If you have drainage at the skin insertion site, you can apply a gauze dressing and secure it with tape. If the drain falls out, apply a gauze dressing over the drain insertion site and secure with tape.   To empty the collection device:   Release the stopper on the top of the collection unit (bulb).  Pour contents into a measuring container such as a plastic medicine cup.  Record the day and amount of drainage on the attached sheet. This should be done at least   twice a day.    To compress the Jackson-Pratt Bulb:  Release the stopper at the top of the bulb. Squeeze the bulb tightly in your fist, squeezing air out of the bulb.  Replace the stopper while the bulb is compressed.  Be careful not to spill the contents when squeezing the bulb. The drainage will start bright red and turn to pink and then yellow with time. IMPORTANT: If the bulb is not squeezed before adding the stopper it will not draw out the fluid.  Care for the JP drain site and your skin daily:  You may shower three days after surgery. Secure the drain to a ribbon or cloth around your waist while showering so it does not pull out while showering. Be sure your hands are cleaned with soap and water. Use a clean wet cotton swab to clean the skin around the drain site.  Use another cotton swab to place Vaseline or antibiotic ointment on the skin around the drain.     Contact your physician if any of the following occur:  The fluid in the bulb becomes cloudy. Your temperature is greater than 101.4.  The incision opens. If you have drainage at the skin insertion site, you can apply a gauze dressing and secure it with tape. If the drain falls out, apply a gauze dressing over the drain insertion site and secure with tape.  You will usually have more drainage when you are active than while you rest or are  asleep. If the drainage increases significantly or is bloody call the physician                             Bring this record with you to each office visit Date  Drainage Volume  Date   Drainage volume                                                                                                                                                                                          

## 2021-03-26 ENCOUNTER — Encounter (HOSPITAL_BASED_OUTPATIENT_CLINIC_OR_DEPARTMENT_OTHER): Payer: Self-pay | Admitting: Surgery

## 2021-03-26 DIAGNOSIS — D0512 Intraductal carcinoma in situ of left breast: Secondary | ICD-10-CM | POA: Diagnosis not present

## 2021-03-26 NOTE — Progress Notes (Signed)
1 Day Post-Op   Subjective/Chief Complaint: Some nausea yesterday which has improved.  Some pain particularly in the axillary regions.  Did not sleep well last night.  She is ready for discharge and think she will do better at home.  Objective: Vital signs in last 24 hours: Temp:  [97.8 F (36.6 C)-98.8 F (37.1 C)] 98.3 F (36.8 C) (08/04 0045) Pulse Rate:  [55-98] 84 (08/04 0045) Resp:  [12-34] 16 (08/04 0045) BP: (94-123)/(52-78) 96/52 (08/04 0045) SpO2:  [94 %-100 %] 99 % (08/04 0300) Weight:  [59.4 kg] 59.4 kg (08/03 0850)    Intake/Output from previous day: 08/03 0701 - 08/04 0700 In: 2260 [P.O.:760; I.V.:1500] Out: 1595 [Urine:1500; Drains:45; Blood:50] Intake/Output this shift: Total I/O In: 640 [P.O.:640] Out: 1200 [Urine:1200]  Breasts: Dressing is intact.  Flaps look great with good viability, including the nipple.  No hematoma or ecchymosis.  Lab Results:  No results for input(s): WBC, HGB, HCT, PLT in the last 72 hours. BMET No results for input(s): NA, K, CL, CO2, GLUCOSE, BUN, CREATININE, CALCIUM in the last 72 hours. PT/INR No results for input(s): LABPROT, INR in the last 72 hours. ABG No results for input(s): PHART, HCO3 in the last 72 hours.  Invalid input(s): PCO2, PO2  Studies/Results: No results found.  Anti-infectives: Anti-infectives (From admission, onward)    Start     Dose/Rate Route Frequency Ordered Stop   03/25/21 1500  ceFAZolin (ANCEF) IVPB 2g/100 mL premix        2 g 200 mL/hr over 30 Minutes Intravenous Every 8 hours 03/25/21 1446     03/25/21 1413  ceFAZolin 1 g / gentamicin 80 mg in NS 500 mL surgical irrigation  Status:  Discontinued          As needed 03/25/21 1414 03/25/21 1446   03/25/21 0845  ceFAZolin (ANCEF) IVPB 2g/100 mL premix        2 g 200 mL/hr over 30 Minutes Intravenous On call to O.R. 03/25/21 0830 03/25/21 1133   03/25/21 0845  ceFAZolin (ANCEF) IVPB 2g/100 mL premix        2 g 200 mL/hr over 30 Minutes  Intravenous On call to O.R. 03/25/21 0830 03/26/21 0559       Assessment/Plan: s/p Procedure(s): BILATERAL SIMPLE NIPPLE SPARING MASTECTOMY (Bilateral) BILATERAL BREAST RECONSTRUCTION WITH PLACEMENT OF TISSUE EXPANDER AND FLEX HD (ACELLULAR HYDRATED DERMIS) (Bilateral)  Doing well after surgery.  Anticipate discharge today after seen by plastic surgery.  We will arrange follow-up in my office next week.  LOS: 0 days    Brianna Merritt 03/26/2021

## 2021-03-26 NOTE — Discharge Summary (Signed)
Physician Discharge Summary  Patient ID: Brianna Merritt MRN: [id number] DOB/AGE: 45-19-1977 45 y.o.  Admit date: 03/25/2021 Discharge date: 03/26/2021  Admission Diagnoses: Breast Cancer  Discharge Diagnoses:  Active Problems:   Breast cancer [facility])   Discharged Condition: good  Hospital Course: 45 year old female presented to the [facility] surgical center for bilateral simple nipple sparing mastectomy by Dr. Lilia Pro with general surgery followed by bilateral breast reconstruction with placement of tissue expanders by Dr. Marla Roe on 03/25/2021.  Patient stayed overnight for observation.  Patient is doing well this morning, reports some nausea last night that improved with Zofran.  Reports no fevers or chills.  Reports some pain to bilateral breast, right worse than left.  Positive flatus, no BM yet.  No other complaints.  She is stable for discharge.  Consults: None  Significant Diagnostic Studies: None  Treatments: IV hydration, antibiotics: Ancef, and analgesia: acetaminophen, Dilaudid, Morphine, and Valium  Discharge Exam: Blood pressure 98/65, pulse 64, temperature 98.6 F (37 C), resp. rate 16, height 5' (1.524 m), weight 59.4 kg, last menstrual period 12/21/2020, SpO2 100 %. General appearance: alert, cooperative, and no distress Head: Normocephalic, without obvious abnormality, atraumatic Resp: Unlabored Breasts: Bilateral breast dressings in place, bilateral NAC's are viable.  No bruising or significant swelling noted.  No skin necrosis noted.  No subcutaneous fluid collections noted.  Serosanguineous drainage noted in bulbs. Extremities: extremities normal, atraumatic, no cyanosis or edema Pulses: 2+ and symmetric  Disposition: Discharge disposition: 01-Home or Self Care       Discharge Instructions     Call MD for:  difficulty breathing, headache or visual disturbances   Complete by: As directed    Call MD for:  extreme fatigue   Complete by: As directed     Call MD for:  hives   Complete by: As directed    Call MD for:  persistant dizziness or light-headedness   Complete by: As directed    Call MD for:  persistant nausea and vomiting   Complete by: As directed    Call MD for:  redness, tenderness, or signs of infection (pain, swelling, redness, odor or green/yellow discharge around incision site)   Complete by: As directed    Call MD for:  severe uncontrolled pain   Complete by: As directed    Call MD for:  temperature >100.4   Complete by: As directed    Diet - low sodium heart healthy   Complete by: As directed    Increase activity slowly   Complete by: As directed       Allergies as of 03/26/2021       Reactions   Ketorolac Itching   Ketorolac Tromethamine Itching   Sumatriptan Other (See Comments), Anaphylaxis   Stroke risk   Meperidine Hcl    Combative   Meperidine Other (See Comments)   combative Other reaction(s): Other (See Comments) combative        Medication List     TAKE these medications    albuterol 108 (90 Base) MCG/ACT inhaler Commonly known as: VENTOLIN HFA Inhale 2 puffs into the lungs every 6 (six) hours as needed for wheezing or shortness of breath.   busPIRone 30 MG tablet Commonly known as: BUSPAR Take 30 mg by mouth in the morning, at noon, and at bedtime.   clonazePAM 0.5 MG tablet Commonly known as: KLONOPIN Take 0.5 mg by mouth 3 (three) times daily as needed.   diazepam 2 MG tablet Commonly known as: Valium Take 1  tablet (2 mg total) by mouth every 12 (twelve) hours as needed for muscle spasms.   diphenhydramine-acetaminophen 25-500 MG Tabs tablet Commonly known as: TYLENOL PM Take 1 tablet by mouth at bedtime as needed.   hydrOXYzine 50 MG tablet Commonly known as: ATARAX/VISTARIL Take 1-1.5 tablets by mouth at bedtime as needed.   linaclotide 72 MCG capsule Commonly known as: LINZESS Take 1 capsule (72 mcg total) by mouth daily before breakfast.   omeprazole 20 MG  capsule Commonly known as: PRILOSEC Take 1 capsule (20 mg total) by mouth daily.   ondansetron 4 MG tablet Commonly known as: Zofran Take 1 tablet (4 mg total) by mouth every 8 (eight) hours as needed for nausea or vomiting.   tamoxifen 20 MG tablet Commonly known as: NOLVADEX Take 1 tablet by mouth once daily         [facility] [address] [place], [place] [address] [phone]  Signed: Carola Rhine Nam Vossler 03/26/2021, 7:24 AM

## 2021-03-31 ENCOUNTER — Encounter (HOSPITAL_BASED_OUTPATIENT_CLINIC_OR_DEPARTMENT_OTHER): Payer: Self-pay | Admitting: Surgery

## 2021-04-02 ENCOUNTER — Inpatient Hospital Stay: Payer: Medicaid Other | Admitting: Hematology and Oncology

## 2021-04-02 LAB — SURGICAL PATHOLOGY

## 2021-04-03 ENCOUNTER — Ambulatory Visit (INDEPENDENT_AMBULATORY_CARE_PROVIDER_SITE_OTHER): Payer: Medicare Other | Admitting: Plastic Surgery

## 2021-04-03 ENCOUNTER — Other Ambulatory Visit: Payer: Self-pay

## 2021-04-03 ENCOUNTER — Encounter: Payer: Self-pay | Admitting: Plastic Surgery

## 2021-04-03 DIAGNOSIS — Z170 Estrogen receptor positive status [ER+]: Secondary | ICD-10-CM

## 2021-04-03 DIAGNOSIS — C50411 Malignant neoplasm of upper-outer quadrant of right female breast: Secondary | ICD-10-CM

## 2021-04-03 NOTE — Progress Notes (Signed)
   Subjective:    Patient ID: Brianna Merritt, female    DOB: Jun 26, 1976, 45 y.o.   MRN: [id number]  The patient is a 45 year old female here for her first postop follow-up after undergoing bilateral mastectomies with expander and Flex HD placement.  She has 100 cc in her 300 cc expanders bilaterally.  There is no sign of seroma or hematoma.  The incisions are healing nicely.  No sign of infection.     Review of Systems  Constitutional: Negative.   Eyes: Negative.   Respiratory: Negative.        Objective:   Physical Exam Constitutional:      Appearance: Normal appearance.  Cardiovascular:     Rate and Rhythm: Normal rate.  Pulmonary:     Effort: Pulmonary effort is normal.  Skin:    Capillary Refill: Capillary refill takes less than 2 seconds.  Neurological:     Mental Status: She is alert and oriented to person, place, and time. Mental status is at baseline.  Psychiatric:        Mood and Affect: Mood normal.        Behavior: Behavior normal.        Assessment & Plan:     ICD-10-CM   1. Malignant neoplasm of upper-outer quadrant of right breast in female, estrogen receptor positive ([place])  C50.411    Z17.0       We placed injectable saline in the Expander using a sterile technique: Right: 50 cc for a total of 150 / 300 cc Left: 50 cc for a total of 150 / 300 cc We will plan to remove the drains next week.  Continue with the sports bra.

## 2021-04-10 ENCOUNTER — Ambulatory Visit (INDEPENDENT_AMBULATORY_CARE_PROVIDER_SITE_OTHER): Payer: Medicare Other | Admitting: Physician Assistant

## 2021-04-10 ENCOUNTER — Other Ambulatory Visit: Payer: Self-pay

## 2021-04-10 DIAGNOSIS — C50411 Malignant neoplasm of upper-outer quadrant of right female breast: Secondary | ICD-10-CM

## 2021-04-10 DIAGNOSIS — Z170 Estrogen receptor positive status [ER+]: Secondary | ICD-10-CM

## 2021-04-10 NOTE — Progress Notes (Signed)
Patient is a 45 year old female with past medical history significant for right-sided invasive ductal carcinoma s/p bilateral NSM mastectomy with immediate bilateral breast reconstruction with placement of tissue expanders and Flex HD performed 03/25/2021 by Dr. Marla Roe who presents to clinic for postop visit.  Patient was last seen here in clinic 04/03/2021 at which point injectable saline was placed in expanders bilaterally.  At end of visit, her right and left breast expanders were 150 out of 300 cc.  Plan was for her to continue wearing compressive sports bra and for JP drain removal and additional fill at next visit.  On my examination, patient states that she has been doing well.  She has been wearing a sports bra 24/7 except for when she is showering.  She did ask if she could base from her waist down if she is not submerging her incisions.  I told her that as long as she is not submerging her upper half, I do not see why that would be an issue.  We also discussed the need to avoid any overhead arm lifting, pushing, or pulling in effort to mitigate risk of wound dehiscence.  She denies any fevers, chills, shortness of breath, leg swelling, cough, redness, or any other concerning history.  She states that she has only had scant drainage from her JP drains bilaterally and is desperately hoping that they can be removed today.  She tells me that she was devastated last week when she was told that they needed to remain.  She has not been keeping a quantifiable record of her output, but states that it is "hardly anything".    Removed her honeycomb dressings along with her JP drains bilaterally.  Tolerated procedures well.  Left the Steri-Strips intact to remain.  She denies any significant fullness or pressure discomfort in her breasts bilaterally.  She would like to proceed with an additional fill today.  Her exam is reassuring without any evidence concerning for infection, seroma, or hematoma.  We  placed injectable saline in the Expander using a sterile technique: Right: 50 cc for a total of 200 / 300 cc Left: 50 cc for a total of 200 / 300 cc  Patient to return in 7-10 days for next fill.  Will call the clinic should she develop any new questions or concerns.

## 2021-04-14 NOTE — Progress Notes (Signed)
Patient is a 45 year old female with PMH of right-sided invasive ductal carcinoma s/p bilateral NSM mastectomy with immediate bilateral breast reconstruction and placement of tissue expanders with Flex HD performed 03/25/2021 by Dr. Marla Roe who presents to the clinic for postop visit.  Patient was last seen here in clinic 04/10/2021 at which time she was doing well.  Her JP drains were removed without complication.  Steri-Strips remained intact.  She denied any fullness or pressure discomfort.  Physical exam was reassuring.  50 cc were injected into each breast which brought expanders to 200 / 300 cc bilaterally.  Plan was for return visit in 7 to 10 days for additional fill.  On my examination, she states that her breasts have been tight, endorses some discomfort particular with activity.  She denies any asymmetry or unusual swelling.  She notices some skin wrinkling.  She understands that this is part of the process.  She denies any fevers, chills, redness, drainage, dehiscence, or other symptoms.  She is showering.  Continues to wear supportive sports bra.  Physical exam was reassuring.  No evidence of infection.  Breasts seem amenable to additional fill.  She would like to proceed.  Tolerated procedure without difficulty.  We placed injectable saline in the Expander using a sterile technique: Right: 50 cc for a total of 250 / 300 cc Left: 50 cc for a total of 250 / 300 cc  She will return to clinic in 10 to 14 days for additional fill.  She can call the clinic should she develop any questions or concerns.

## 2021-04-17 ENCOUNTER — Ambulatory Visit (INDEPENDENT_AMBULATORY_CARE_PROVIDER_SITE_OTHER): Payer: Medicare Other | Admitting: Physician Assistant

## 2021-04-17 ENCOUNTER — Other Ambulatory Visit: Payer: Self-pay

## 2021-04-17 DIAGNOSIS — C50411 Malignant neoplasm of upper-outer quadrant of right female breast: Secondary | ICD-10-CM

## 2021-04-17 DIAGNOSIS — Z170 Estrogen receptor positive status [ER+]: Secondary | ICD-10-CM

## 2021-04-20 NOTE — Progress Notes (Signed)
[facility]  [address] [place],  [place]  [address] [phone]  Clinic Day: 04/28/21  Referring physician: Jeanie Sewer, NP  This document serves as a record of services personally performed by Hosie Poisson, MD. It was created on their behalf by Curry,Lauren E, a trained medical scribe. The creation of this record is based on the scribe's personal observations and the provider's statements to them.  CHIEF COMPLAINT:  CC:  Stage I A hormone sensitive breast cancer  Current Treatment:   Tamoxifen 20 mg daily   HISTORY OF PRESENT ILLNESS:  Brianna Merritt is a 45 y.o. 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  Annual mammography in March revealed possible asymmetries within the bilateral breasts. Bilateral diagnostic mammogram revealed a heterogeneous hypoechoic irregular mass with microlobulated and angular margins at the 10 o'clock position 6 cm from the nipple measuring 7 x 9 x 6 mm in the right breast.  Ultrasound of the right axilla was negative.  No evidence of malignancy was seen in the left breast.  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  The invasive carcinoma involved all cores measuring 8 mm in maximal extent.  Estrogen receptor was 95% positive and progesterone receptor was 100% positive.  HER2 was equivocal 2+, but HER2 by FISH was negative.    Due to her personal and family history of breast cancer she underwent genetic testing with the [employer] myRisk Hereditary Cancer Panel.  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  This particular mutation is associated with an increased lifetime risk of breast cancer of 20 to 31%, with the risk of a 2nd breast cancer primary within 10  years of her 1st breast cancer up to 29%.  There is insufficient evidence to recommend prophylactic mastectomy with a CHEK2 mutation. There is also possibly an elevated risk of colorectal cancer associated with a CHEK2 mutation, which cannot be quantified. There was also a variant of uncertain significance (VUS) found in the MSH2.  She was treated with lumpectomy and right axillary sentinel lymph node biopsy in April 2021.  Final pathology revealed an 8 mm, grade 2,  invasive ductal carcinoma with ductal carcinoma in situ, intermediate nuclear grade.  Margins were free of neoplasm.  One sentinel lymph node was negative for metastatic carcinoma (0/1).  She was premenopausal, and had irregular menses, so was taking Lo Loestrin 1 mg/10 mcg for about 5 months prior to her diagnosis.  This was discontinued following her diagnosis.  EndoPredict revealed a 12 gene molecular score of 7.2 with an EPclin score of 3.0, which is considered low risk.  Her risk for distant recurrence within 10 years is 7.4% with adjuvant radiation and endocrine therapy alone, with an absolute chemotherapy benefit of 1.9%.  Her risk for distant recurrence in 5-15 years is 5.8%.  Adjuvant chemotherapy was not recommended.  She completed adjuvant radiation in July. She was placed on adjuvant hormonal therapy with tamoxifen 20 mg daily in September. She was on duloxetine at that time and due to the interaction with tamoxifen, she was switched to venlafaxine, but later stopped it. Her last colonoscopy and EGD were in 2004, at which time colonoscopy was unremarkable. She had GERD with a hiatal hernia, mild distal esophagitis and healed  gastric ulcer.  We recommended repeat colonoscopy due to the CHEK2 mutation. EGD and colonoscopy did not reveal any significant abnormality.  She did have removal of 2 polyps, 1 of which was a villous adenoma, and the other a hyperplastic polyp.    She had an abnormal mammogram that revealed calcifications within the  bilateral breasts. On MRI breast these areas were suspicious.  There was no corresponding mass on ultrasound.   MRI guided biopsy of the right breast upper inner quadrant and left breast upper inner quadrant revealed fibroadenomatoid changes and fibrocystic with usual ductal hyperplasia, fibroadenomatoid changes respectively.  Stereotactic biopsy of 2 areas the right lower outer quadrant was also recommended.  Pathology of the posterior depth revealed diminutive complex sclerosing lesion with calcifications. Pathology of the anterior depth revealed benign glands with calcifications. She was scheduled for a stereotactic biopsy of 2 areas of the left breast upper outer quadrant on May 16th, but she saw Dr. Lilia Pro.  Due to the need for multiple biopsies and increased risk for breast cancer with her CHEK2 mutation, they decided on bilateral mastectomies and immediate breast reconstruction and he referred her to Dr. Marla Roe.    INTERVAL HISTORY:  Brianna Merritt is here for routine follow up after undergoing bilateral mastectomies with Dr. Lilia Pro on August 3rd.  This was followed by immediate reconstruction with Dr. Marla Roe.  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  Margins uninvolved by carcinoma.  Complex sclerosing lesion, fibrocystic changes with apocrine metaplasia and adenosis and calcifications.  Right breast revealed fibroadenomatoid and fibrocystic changes with calcifications.  Previous surgical site changes.  No malignancy identified.  She states that she is healing well, but notes fatigue and mild pain which she rates as a 4/10.  She has expanders in place.  She has not restarted her tamoxifen, and so I advised that she resume this.  Her  appetite is fair, and she has lost 1 pound since her last visit.  She denies fever, chills or other signs of infection.  She denies nausea, vomiting, bowel issues, or abdominal pain.  She denies sore  throat, cough, dyspnea, or chest pain.  REVIEW OF SYSTEMS:  Review of Systems  Constitutional:  Positive for fatigue. Negative for appetite change, chills, fever and unexpected weight change.  HENT:  Negative.    Eyes: Negative.   Respiratory: Negative.  Negative for chest tightness, cough, hemoptysis, shortness of breath and wheezing.   Cardiovascular: Negative.  Negative for chest pain, leg swelling and palpitations.  Gastrointestinal: Negative.  Negative for abdominal distention, abdominal pain, blood in stool, constipation, diarrhea, nausea and vomiting.  Endocrine: Negative.   Genitourinary: Negative.  Negative for difficulty urinating, dysuria, frequency and hematuria.   Musculoskeletal: Negative.  Negative for arthralgias, back pain, flank pain, gait problem and myalgias.       Mild pain from bilateral mastectomies/reconstruction  Skin: Negative.   Neurological: Negative.  Negative for dizziness, extremity weakness, gait problem, headaches, light-headedness, numbness, seizures and speech difficulty.  Hematological: Negative.   Psychiatric/Behavioral: Negative.  Negative for depression and sleep disturbance. The patient is not nervous/anxious.     VITALS:  Blood pressure (!) 105/56, pulse 75, temperature 97.7 F (36.5 C), temperature source Oral, resp. rate 18, height 5' (1.524 m), weight 134 lb 3.2 oz (60.9 kg), SpO2 100 %.  Wt Readings from Last 3 Encounters:  04/28/21 134 lb 3.2 oz (60.9 kg)  03/25/21 130 lb 15.3 oz (59.4 kg)  03/03/21  131 lb 9.6 oz (59.7 kg)    Body mass index is 26.21 kg/m.  Performance status (ECOG): 1 - Symptomatic but completely ambulatory  PHYSICAL EXAM:  Physical Exam Constitutional:      General: She is not in acute distress.    Appearance: Normal appearance. She is normal weight.  HENT:     Head: Normocephalic and atraumatic.  Eyes:     General: No scleral icterus.    Extraocular Movements: Extraocular movements intact.      Conjunctiva/sclera: Conjunctivae normal.     Pupils: Pupils are equal, round, and reactive to light.  Cardiovascular:     Rate and Rhythm: Normal rate and regular rhythm.     Pulses: Normal pulses.     Heart sounds: Normal heart sounds. No murmur heard.   No friction rub. No gallop.  Pulmonary:     Effort: Pulmonary effort is normal. No respiratory distress.     Breath sounds: Normal breath sounds.  Chest:     Comments: Bilateral reconstructions are healing well but mildly tender.  She has her temporary expanders in place. Abdominal:     General: Bowel sounds are normal. There is no distension.     Palpations: Abdomen is soft. There is no hepatomegaly, splenomegaly or mass.     Tenderness: There is no abdominal tenderness.  Musculoskeletal:        General: Normal range of motion.     Cervical back: Normal range of motion and neck supple.     Right lower leg: No edema.     Left lower leg: No edema.  Lymphadenopathy:     Cervical: No cervical adenopathy.  Skin:    General: Skin is warm and dry.  Neurological:     General: No focal deficit present.     Mental Status: She is alert and oriented to person, place, and time. Mental status is at baseline.  Psychiatric:        Mood and Affect: Mood normal.        Behavior: Behavior normal.        Thought Content: Thought content normal.        Judgment: Judgment normal.   LABS:   CBC Latest Ref Rng & Units 04/28/2021 10/03/2020 07/04/2020  WBC - 5.2 4.2 6.4  Hemoglobin 12.0 - 16.0 13.1 13.3 13.5  Hematocrit 36 - 46 39 39 40.6  Platelets 150 - 399 244 213 267   CMP Latest Ref Rng & Units 04/28/2021 10/03/2020 07/04/2020  Glucose 70 - 99 mg/dL - - 95  BUN 4 - 21 13 10 12   Creatinine 0.5 - 1.1 0.8 0.8 0.80  Sodium 137 - 147 139 139 140  Potassium 3.4 - 5.3 3.7 4.3 3.9  Chloride 99 - 108 103 108 108  CO2 13 - 22 25(A) 26(A) 21(L)  Calcium 8.7 - 10.7 8.8 9.0 9.1  Total Protein 6.5 - 8.1 g/dL - - 7.4  Total Bilirubin 0.3 - 1.2 mg/dL - -  0.3  Alkaline Phos 25 - 125 49 43 32(L)  AST 13 - 35 29 18 19   ALT 7 - 35 15 19 15     STUDIES:  No results found.   SURGICAL PATHOLOGY  THIS IS AN ADDENDUM REPORT  CASE: [id number]  PATIENT: [facility]  Surgical Pathology Report  Addendum  Reason for Addendum #1:  Breast Biomarker Results   Clinical History: malignant neoplasm of upper-outer quadrant of right  breast in female, ER + (cm)    FINAL  MICROSCOPIC DIAGNOSIS:   A. BREAST, LEFT, MASTECTOMY:  -  Ductal carcinoma in situ, intermediate grade, 0.3 cm  -  Margins uninvolved by carcinoma  -  Complex sclerosing lesion, fibrocystic changes with apocrine  metaplasia and adenosis  -  Calcifications  -  See oncology table below   B. BREAST, RIGHT, MASTECTOMY:  -  Fibroadenomatoid and fibrocystic changes with calcifications  -  Previous surgical site changes  -  No malignancy identified   HISTORY:   Allergies:  Allergies  Allergen Reactions   Ketorolac Itching   Ketorolac Tromethamine Itching   Sumatriptan Other (See Comments) and Anaphylaxis    Stroke risk   Meperidine Hcl     Combative   Meperidine Other (See Comments)    combative Other reaction(s): Other (See Comments) combative   Triptans Other (See Comments)    Hemiplegic migraine    Current Medications: Current Outpatient Medications  Medication Sig Dispense Refill   albuterol (PROVENTIL HFA;VENTOLIN HFA) 108 (90 BASE) MCG/ACT inhaler Inhale 2 puffs into the lungs every 6 (six) hours as needed for wheezing or shortness of breath.     busPIRone (BUSPAR) 5 MG tablet Take by mouth.     clonazePAM (KLONOPIN) 0.5 MG tablet Take 0.5 mg by mouth 3 (three) times daily as needed.     diphenhydramine-acetaminophen (TYLENOL PM) 25-500 MG TABS tablet Take 1 tablet by mouth at bedtime as needed.     hydrOXYzine (ATARAX/VISTARIL) 25 MG tablet SMARTSIG:1-1.5 Tablet(s) By Mouth 3 Times Daily PRN     linaclotide (LINZESS) 72 MCG capsule Take 1 capsule (72 mcg  total) by mouth daily before breakfast. 30 capsule 11   methocarbamol (ROBAXIN) 500 MG tablet Take 1 tablet (500 mg total) by mouth 2 (two) times daily as needed for muscle spasms. 20 tablet 0   omeprazole (PRILOSEC) 20 MG capsule Take 1 capsule (20 mg total) by mouth daily. 90 capsule 3   ondansetron (ZOFRAN) 4 MG tablet Take 1 tablet (4 mg total) by mouth every 8 (eight) hours as needed for nausea or vomiting. 20 tablet 0   tamoxifen (NOLVADEX) 20 MG tablet Take 1 tablet by mouth once daily (Patient taking differently: Stopped it for surgery and has not started it back) 90 tablet 3   Current Facility-Administered Medications  Medication Dose Route Frequency Provider Last Rate Last Admin   0.9 %  sodium chloride infusion  500 mL Intravenous Once Jackquline Denmark, MD         ASSESSMENT & PLAN:   Assessment:  1.  Stage IA (T1b N0 M0) hormone receptor positive breast cancer, treated with lumpectomy and adjuvant radiation.  She continues adjuvant hormonal therapy with tamoxifen daily and is tolerating this well, except for fatigue. She remains without evidence of recurrence.  2.  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  She has undergone bilateral mastectomies with Dr. Lilia Pro, and she is undergoing reconstruction with Dr. Marla Roe.  3.  CHEK2 heterozygous gene mutation.  This is a high risk gene related to an increased risk of breast cancer with her cancer risk being 20-31% when compared to the risk for the general population of 10.2%.  Her risk for developing a secondary primary within 10 years is up to 29%.  This gene is also associated with a possible elevated risk for colorectal cancer.  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 EGD and colonoscopy in March was negative.  4.  Seizure  disorder; she has been without an episode in about 4 years.  5.  Tobacco abuse.  I discussed the importance of the cessation of  smoking.   6.  Depression/anxiety, she is not taking venlafaxine or duloxetine.  She continues clonazepam and buspirone.  Plan:      She has undergone bilateral mastectomies and is healing well from her procedure.  She is undergoing reconstruction with Dr. Marla Roe, and has expanders in place.  She will resume tamoxifen daily, but will hold a week prior to her definitve reconstruction surgery.  Otherwise, we will plan to see her back in 3 months for re-examination.  The patient understands the plans discussed today and is in agreement with them.  She knows to contact our office if she develops concerns prior to her next appointment.   I, Rita Ohara, am acting as scribe for Derwood Kaplan, MD  I have reviewed this report as typed by the medical scribe, and it is complete and accurate.

## 2021-04-21 NOTE — Progress Notes (Cosign Needed Addendum)
Patient is a 45 year old female with PMH of right-sided invasive ductal carcinoma s/p bilateral NSM mastectomy with immediate bilateral breast reconstruction and placement of tissue expanders with Flex HD performed 03/25/2021 by Dr. Marla Roe who presents to the clinic for postop visit.  Patient was last seen here in clinic on 04/17/2021.  At that time, she mentioned that her breasts have been tight and she has had mild discomfort with some activity.  Her physical exam was entirely reassuring.  No evidence of infection or seroma amenable to aspiration.  She added that she would like to proceed with additional fill.  50 cc was added bilaterally, total volume at conclusion was 250 / 300 cc bilaterally.  Plan was for her to return to the clinic in 10 to 14 days for additional fill.  On my exam today, 7 days since last encounter, patient continues to endorse similar complaints of discomfort with muscular component.  Whenever she exerts herself or moves her arms in a certain way, she will feel discomfort in the breasts.  She states that she will take ibuprofen which helps relieve her discomfort.  She already takes Klonopin daily.  Given that she is endorsing muscle spasms in the expanders are submuscular, will prescribe course of Robaxin.  Discussed adverse effects of the medication.  She denies any fevers, redness, worsening pain, streaking, or any other symptoms.  Advised patient to return in 2 weeks to follow-up with Dr. Tedra Coupe him given that she is starting to endorse tightness and unsure as to whether or not there will be any additional fills.  We placed injectable saline in the Expander using a sterile technique: Right: 50 cc for a total of 300 / 300 cc Left: 50 cc for a total of 300 / 300 cc  Any pictures obtained of the patient and placed in the chart were with the patient's or guardian's permission.

## 2021-04-24 ENCOUNTER — Ambulatory Visit (INDEPENDENT_AMBULATORY_CARE_PROVIDER_SITE_OTHER): Payer: Medicare Other | Admitting: Physician Assistant

## 2021-04-24 ENCOUNTER — Other Ambulatory Visit: Payer: Self-pay

## 2021-04-24 DIAGNOSIS — C50411 Malignant neoplasm of upper-outer quadrant of right female breast: Secondary | ICD-10-CM

## 2021-04-24 DIAGNOSIS — Z170 Estrogen receptor positive status [ER+]: Secondary | ICD-10-CM

## 2021-04-24 MED ORDER — METHOCARBAMOL 500 MG PO TABS: 500.0000 mg | ORAL_TABLET | Freq: Two times a day (BID) | ORAL | 0 refills | Status: DC | PRN

## 2021-04-24 NOTE — Addendum Note (Signed)
Addended by: Krista Blue on: 04/24/2021 10:51 AM   Modules accepted: Orders

## 2021-04-28 ENCOUNTER — Inpatient Hospital Stay: Payer: Medicare Other | Attending: Hematology and Oncology | Admitting: Oncology

## 2021-04-28 ENCOUNTER — Other Ambulatory Visit: Payer: Self-pay

## 2021-04-28 ENCOUNTER — Telehealth: Payer: Self-pay | Admitting: Oncology

## 2021-04-28 ENCOUNTER — Other Ambulatory Visit: Payer: Self-pay | Admitting: Oncology

## 2021-04-28 ENCOUNTER — Inpatient Hospital Stay: Payer: Medicare Other

## 2021-04-28 ENCOUNTER — Encounter: Payer: Self-pay | Admitting: Oncology

## 2021-04-28 VITALS — BP 105/56 | HR 75 | Temp 97.7°F | Resp 18 | Ht 60.0 in | Wt 134.2 lb

## 2021-04-28 DIAGNOSIS — C50919 Malignant neoplasm of unspecified site of unspecified female breast: Secondary | ICD-10-CM

## 2021-04-28 DIAGNOSIS — Z1502 Genetic susceptibility to malignant neoplasm of ovary: Secondary | ICD-10-CM

## 2021-04-28 DIAGNOSIS — Z1509 Genetic susceptibility to other malignant neoplasm: Secondary | ICD-10-CM

## 2021-04-28 DIAGNOSIS — Z170 Estrogen receptor positive status [ER+]: Secondary | ICD-10-CM | POA: Diagnosis not present

## 2021-04-28 DIAGNOSIS — C50411 Malignant neoplasm of upper-outer quadrant of right female breast: Secondary | ICD-10-CM

## 2021-04-28 DIAGNOSIS — D0512 Intraductal carcinoma in situ of left breast: Secondary | ICD-10-CM | POA: Diagnosis not present

## 2021-04-28 DIAGNOSIS — Z1589 Genetic susceptibility to other disease: Secondary | ICD-10-CM

## 2021-04-28 LAB — CBC AND DIFFERENTIAL
HCT: 39 (ref 36–46)
Hemoglobin: 13.1 (ref 12.0–16.0)
Neutrophils Absolute: 2.86
Platelets: 244 (ref 150–399)
WBC: 5.2

## 2021-04-28 LAB — BASIC METABOLIC PANEL
BUN: 13 (ref 4–21)
CO2: 25 — AB (ref 13–22)
Chloride: 103 (ref 99–108)
Creatinine: 0.8 (ref 0.5–1.1)
Glucose: 83
Potassium: 3.7 (ref 3.4–5.3)
Sodium: 139 (ref 137–147)

## 2021-04-28 LAB — HEPATIC FUNCTION PANEL
ALT: 15 (ref 7–35)
AST: 29 (ref 13–35)
Alkaline Phosphatase: 49 (ref 25–125)
Bilirubin, Total: 0.5

## 2021-04-28 LAB — CBC: RBC: 4.09 (ref 3.87–5.11)

## 2021-04-28 LAB — COMPREHENSIVE METABOLIC PANEL
Albumin: 4.1 (ref 3.5–5.0)
Calcium: 8.8 (ref 8.7–10.7)

## 2021-04-28 NOTE — Telephone Encounter (Signed)
Per 9/6 LOS, patient scheduled for Dec Appt.  Gave patient Appt Summary

## 2021-05-01 DIAGNOSIS — D0512 Intraductal carcinoma in situ of left breast: Secondary | ICD-10-CM | POA: Insufficient documentation

## 2021-05-08 ENCOUNTER — Other Ambulatory Visit: Payer: Self-pay

## 2021-05-08 ENCOUNTER — Ambulatory Visit (INDEPENDENT_AMBULATORY_CARE_PROVIDER_SITE_OTHER): Payer: Medicare Other | Admitting: Plastic Surgery

## 2021-05-08 ENCOUNTER — Encounter: Payer: Self-pay | Admitting: Plastic Surgery

## 2021-05-08 DIAGNOSIS — Z9013 Acquired absence of bilateral breasts and nipples: Secondary | ICD-10-CM

## 2021-05-08 DIAGNOSIS — Z170 Estrogen receptor positive status [ER+]: Secondary | ICD-10-CM

## 2021-05-08 DIAGNOSIS — D0512 Intraductal carcinoma in situ of left breast: Secondary | ICD-10-CM

## 2021-05-08 DIAGNOSIS — C50411 Malignant neoplasm of upper-outer quadrant of right female breast: Secondary | ICD-10-CM

## 2021-05-08 DIAGNOSIS — Z9010 Acquired absence of unspecified breast and nipple: Secondary | ICD-10-CM | POA: Insufficient documentation

## 2021-05-08 NOTE — Progress Notes (Signed)
   Subjective:    Patient ID: Brianna Merritt, female    DOB: Apr 22, 1976, 45 y.o.   MRN: [id number]  The patient is a 45 year old female here for follow-up on her bilateral breast reconstruction.  She is doing well.  She complains of a little bit of asymmetry and a little bit of discomfort on the right.  No sign of infection or seroma.  She would like to move towards exchange as soon as possible.     Review of Systems  Constitutional: Negative.   Eyes: Negative.   Respiratory: Negative.    Cardiovascular: Negative.   Gastrointestinal: Negative.   Endocrine: Negative.   Genitourinary: Negative.       Objective:   Physical Exam Constitutional:      Appearance: Normal appearance.  Cardiovascular:     Rate and Rhythm: Normal rate.     Pulses: Normal pulses.  Pulmonary:     Effort: Pulmonary effort is normal.  Neurological:     Mental Status: She is alert. Mental status is at baseline.  Psychiatric:        Mood and Affect: Mood normal.        Behavior: Behavior normal.        Assessment & Plan:     ICD-10-CM   1. Ductal carcinoma in situ of left breast  D05.12     2. Malignant neoplasm of upper-outer quadrant of right breast in female, estrogen receptor positive ([place])  C50.411    Z17.0     3. Acquired absence of both breasts  Z90.13       We placed injectable saline in the Expander using a sterile technique: Right: 50 cc for a total of 350 / 300 cc Left: 50 cc for a total of 350 / 300 cc  Plan for removal of bilateral expanders and placement of silicone implants.

## 2021-05-12 ENCOUNTER — Other Ambulatory Visit: Payer: Self-pay | Admitting: Physician Assistant

## 2021-05-12 NOTE — Telephone Encounter (Signed)
This was one of those auto-generated pharmacy requests.  I called patient, she was not requesting additional muscles relaxants. Thank you

## 2021-06-09 ENCOUNTER — Encounter: Payer: Self-pay | Admitting: Physician Assistant

## 2021-06-09 ENCOUNTER — Other Ambulatory Visit: Payer: Self-pay

## 2021-06-09 ENCOUNTER — Ambulatory Visit (INDEPENDENT_AMBULATORY_CARE_PROVIDER_SITE_OTHER): Payer: Medicare Other | Admitting: Physician Assistant

## 2021-06-09 VITALS — BP 99/66 | HR 83 | Ht 60.0 in | Wt 136.2 lb

## 2021-06-09 DIAGNOSIS — D0512 Intraductal carcinoma in situ of left breast: Secondary | ICD-10-CM

## 2021-06-09 MED ORDER — CEPHALEXIN 500 MG PO CAPS: 500.0000 mg | ORAL_CAPSULE | Freq: Four times a day (QID) | ORAL | 0 refills | Status: AC

## 2021-06-09 MED ORDER — DIAZEPAM 2 MG PO TABS: 2.0000 mg | ORAL_TABLET | Freq: Two times a day (BID) | ORAL | 0 refills | Status: DC | PRN

## 2021-06-09 MED ORDER — HYDROCODONE-ACETAMINOPHEN 5-325 MG PO TABS: 1.0000 | ORAL_TABLET | Freq: Four times a day (QID) | ORAL | 0 refills | Status: AC | PRN

## 2021-06-09 NOTE — H&P (View-Only) (Signed)
Patient ID: Brianna Merritt, female    DOB: 1976/06/02, 45 y.o.   MRN: [id number]  Chief Complaint  Patient presents with   Pre-op Exam      ICD-10-CM   1. Ductal carcinoma in situ of left breast  D05.12        History of Present Illness: Brianna Merritt is a 45 y.o.  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  She presents for preoperative evaluation for upcoming procedure, implant exchange, scheduled for 07/01/2021 with Dr. Marla Roe.  The patient has not had problems with anesthesia.  Patient quit smoking cigarettes approximately 8 months ago.  However, she does state that she will occasionally vape.  Emphasized the importance of any and all nicotine cessation, including patches or other products that help with tobacco cessation.  She believes that her product might be nicotine free, will verify.  If there is any nicotine, she knows that she needs to discontinue immediately and abstain until after she is recovered.  She denies any personal or family history of blood clot or known clotting disease.  She is not diabetic.  Patient understands the need to abstain from taking any of her preventative tamoxifen 2 weeks before and after surgery.  Summary of Previous Visit: Patient was last seen here in clinic on 05/08/2021.  Her exam was reassuring and she expressed that she would like to move towards the second phase of reconstruction as soon as possible.  She has a total of 350/300 cc in each expander.  Plan is for silicone implant placement.  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  PMH Significant for: Right-sided invasive ductal carcinoma s/p NSM mastectomy with reconstruction, bipolar disorder, anxiety.     Past Medical History: Allergies: Allergies  Allergen Reactions   Ketorolac Itching   Ketorolac Tromethamine Itching   Sumatriptan Other  (See Comments) and Anaphylaxis    Stroke risk   Meperidine Hcl     Combative   Meperidine Other (See Comments)    combative Other reaction(s): Other (See Comments) combative   Triptans Other (See Comments)    Hemiplegic migraine    Current Medications:  Current Outpatient Medications:    albuterol (PROVENTIL HFA;VENTOLIN HFA) 108 (90 BASE) MCG/ACT inhaler, Inhale 2 puffs into the lungs every 6 (six) hours as needed for wheezing or shortness of breath., Disp: , Rfl:    busPIRone (BUSPAR) 5 MG tablet, Take by mouth., Disp: , Rfl:    clonazePAM (KLONOPIN) 0.5 MG tablet, Take 0.5 mg by mouth 3 (three) times daily as needed., Disp: , Rfl:    diphenhydramine-acetaminophen (TYLENOL PM) 25-500 MG TABS tablet, Take 1 tablet by mouth at bedtime as needed., Disp: , Rfl:    hydrOXYzine (ATARAX/VISTARIL) 25 MG tablet, SMARTSIG:1-1.5 Tablet(s) By Mouth 3 Times Daily PRN, Disp: , Rfl:    linaclotide (LINZESS) 72 MCG capsule, Take 1 capsule (72 mcg total) by mouth daily before breakfast., Disp: 30 capsule, Rfl: 11   methocarbamol (ROBAXIN) 500 MG tablet, Take 1 tablet (500 mg total) by mouth 2 (two) times daily as needed for muscle spasms., Disp: 20 tablet, Rfl: 0   omeprazole (PRILOSEC) 20 MG capsule, Take 1 capsule (20 mg total) by mouth daily., Disp: 90 capsule, Rfl: 3   ondansetron (ZOFRAN) 4 MG tablet, Take 1 tablet (4 mg total) by mouth every 8 (eight) hours as needed for nausea or vomiting., Disp: 20 tablet,  Rfl: 0   tamoxifen (NOLVADEX) 20 MG tablet, Take 1 tablet by mouth once daily (Patient taking differently: Stopped it for surgery and has not started it back), Disp: 90 tablet, Rfl: 3  Current Facility-Administered Medications:    0.9 %  sodium chloride infusion, 500 mL, Intravenous, Once, Jackquline Denmark, MD  Past Medical Problems: Past Medical History:  Diagnosis Date   Anxiety    Anxiety    Breast cancer ([place]) 2021   DDD (degenerative disc disease)    Migraine    Seizures ([place])     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    Past Surgical History: Past Surgical History:  Procedure Laterality Date   BREAST LUMPECTOMY     BREAST RECONSTRUCTION WITH PLACEMENT OF TISSUE EXPANDER AND FLEX HD (ACELLULAR HYDRATED DERMIS) Bilateral 03/25/2021   Procedure: BILATERAL BREAST RECONSTRUCTION WITH PLACEMENT OF TISSUE EXPANDER AND FLEX HD (ACELLULAR HYDRATED DERMIS);  Surgeon: Wallace Going, DO;  Location: [facility];  Service: Plastics;  Laterality: Bilateral;   CESAREAN SECTION     CHOLECYSTECTOMY     ENDOMETRIAL ABLATION     NIPPLE SPARING MASTECTOMY Bilateral 03/25/2021   Procedure: BILATERAL SIMPLE NIPPLE SPARING MASTECTOMY;  Surgeon: Pollyann Samples, MD;  Location: [facility];  Service: General;  Laterality: Bilateral;   TUBAL LIGATION      Social History: Social History   Socioeconomic History   Marital status: Married    Spouse name: Brianna Merritt   Number of children: 5   Years of education: Not on file   Highest education level: Not on file  Occupational History   Not on file  Tobacco Use   Smoking status: Former    Packs/day: 1.50    Years: 33.00    Pack years: 49.50    Types: Cigarettes    Start date: 08/23/1988    Quit date: 11/21/2020    Years since quitting: 0.5   Smokeless tobacco: Former   Tobacco comments:    also vape at times  Vaping Use   Vaping Use: Some days  Substance and Sexual Activity   Alcohol use: No   Drug use: No   Sexual activity: Yes  Other Topics Concern   Not on file  Social History Narrative   Not on file   Social Determinants of Health   Financial Resource Strain: Not on file  Food Insecurity: Not on file  Transportation Needs: Not on file  Physical Activity: Not on file  Stress: Not on file  Social Connections: Not on file  Intimate Partner Violence: Not on file    Family History: Family History  Problem Relation Age of Onset   Colitis Mother    Colon cancer Paternal Uncle     Diabetes Paternal Grandmother     Review of Systems: ROS Denies recent illness or infection  Physical Exam: Vital Signs BP 99/66 (BP Location: Left Arm, Patient Position: Sitting, Cuff Size: Normal)   Pulse 83   Ht 5' (1.524 m)   Wt 136 lb 3.2 oz (61.8 kg)   SpO2 100%   BMI 26.60 kg/m   Physical Exam Constitutional:      General: Not in acute distress.    Appearance: Normal appearance. Not ill-appearing.  HENT:     Head: Normocephalic and atraumatic.  Eyes:     Pupils: Pupils are equal, round Neck:     Musculoskeletal: Normal range of motion.  Cardiovascular:     Rate and Rhythm: Normal rate  Pulses: Normal pulses.  Pulmonary:     Effort: Pulmonary effort is normal. No respiratory distress.  Abdominal:     General: Abdomen is flat. There is no distension.  Musculoskeletal: Normal range of motion.  No lower extremity swelling or edema.  Peripheral pulses intact.  No varicosities. Skin:    General: Skin is warm and dry.     Findings: No erythema or rash.  Neurological:     General: No focal deficit present.     Mental Status: Alert and oriented to person, place, and time. Mental status is at baseline.     Motor: No weakness.  Psychiatric:        Mood and Affect: Mood normal.        Behavior: Behavior normal.    Assessment/Plan: The patient is scheduled for 07/01/2021 with Dr. Marla Roe.  Risks, benefits, and alternatives of procedure discussed, questions answered and consent obtained.    Smoking Status: Quit smoking cigarettes 8 months ago.  Occasionally vapes, she will abstain moving forward.    Caprini Score: 6, high risk; Risk Factors include: Previous malignancy, BMI greater than 25, age, and length of planned surgery. Recommendation for mechanical prophylaxis. Encourage early ambulation.   Pictures obtained: 04/24/2021.  Post-op Rx sent to pharmacy: Norco, Keflex, and Valium.  She reports that she has adequate amount of Zofran at home remaining.   Patient  was provided with the General Surgical Risk consent document and Pain Medication Agreement prior to their appointment.  They had adequate time to read through the risk consent documents and Pain Medication Agreement. We also discussed them in person together during this preop appointment. All of their questions were answered to their satisfaction.  Recommended calling if they have any further questions.  Risk consent form and Pain Medication Agreement to be scanned into patient's chart.  The risks that can be encountered with and after placement of a breast implant placement were discussed and include the following but not limited to these: bleeding, infection, delayed healing, anesthesia risks, skin sensation changes, injury to structures including nerves, blood vessels, and muscles which may be temporary or permanent, allergies to tape, suture materials and glues, blood products, topical preparations or injected agents, skin contour irregularities, skin discoloration and swelling, deep vein thrombosis, cardiac and pulmonary complications, pain, which may persist, fluid accumulation, wrinkling of the skin over the implant, changes in nipple or breast sensation, implant leakage or rupture, faulty position of the implant, persistent pain, formation of tight scar tissue around the implant (capsular contracture), possible need for revisional surgery or staged procedures.    Electronically signed by: Krista Blue, PA-C 06/09/2021 4:50 PM

## 2021-06-09 NOTE — Progress Notes (Signed)
Patient ID: Brianna Merritt, female    DOB: 1976-01-23, 45 y.o.   MRN: [id number]  Chief Complaint  Patient presents with   Pre-op Exam      ICD-10-CM   1. Ductal carcinoma in situ of left breast  D05.12        History of Present Illness: Brianna Merritt is a 45 y.o.  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  She presents for preoperative evaluation for upcoming procedure, implant exchange, scheduled for 07/01/2021 with Dr. Marla Roe.  The patient has not had problems with anesthesia.  Patient quit smoking cigarettes approximately 8 months ago.  However, she does state that she will occasionally vape.  Emphasized the importance of any and all nicotine cessation, including patches or other products that help with tobacco cessation.  She believes that her product might be nicotine free, will verify.  If there is any nicotine, she knows that she needs to discontinue immediately and abstain until after she is recovered.  She denies any personal or family history of blood clot or known clotting disease.  She is not diabetic.  Patient understands the need to abstain from taking any of her preventative tamoxifen 2 weeks before and after surgery.  Summary of Previous Visit: Patient was last seen here in clinic on 05/08/2021.  Her exam was reassuring and she expressed that she would like to move towards the second phase of reconstruction as soon as possible.  She has a total of 350/300 cc in each expander.  Plan is for silicone implant placement.  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  PMH Significant for: Right-sided invasive ductal carcinoma s/p NSM mastectomy with reconstruction, bipolar disorder, anxiety.     Past Medical History: Allergies: Allergies  Allergen Reactions   Ketorolac Itching   Ketorolac Tromethamine Itching   Sumatriptan Other  (See Comments) and Anaphylaxis    Stroke risk   Meperidine Hcl     Combative   Meperidine Other (See Comments)    combative Other reaction(s): Other (See Comments) combative   Triptans Other (See Comments)    Hemiplegic migraine    Current Medications:  Current Outpatient Medications:    albuterol (PROVENTIL HFA;VENTOLIN HFA) 108 (90 BASE) MCG/ACT inhaler, Inhale 2 puffs into the lungs every 6 (six) hours as needed for wheezing or shortness of breath., Disp: , Rfl:    busPIRone (BUSPAR) 5 MG tablet, Take by mouth., Disp: , Rfl:    clonazePAM (KLONOPIN) 0.5 MG tablet, Take 0.5 mg by mouth 3 (three) times daily as needed., Disp: , Rfl:    diphenhydramine-acetaminophen (TYLENOL PM) 25-500 MG TABS tablet, Take 1 tablet by mouth at bedtime as needed., Disp: , Rfl:    hydrOXYzine (ATARAX/VISTARIL) 25 MG tablet, SMARTSIG:1-1.5 Tablet(s) By Mouth 3 Times Daily PRN, Disp: , Rfl:    linaclotide (LINZESS) 72 MCG capsule, Take 1 capsule (72 mcg total) by mouth daily before breakfast., Disp: 30 capsule, Rfl: 11   methocarbamol (ROBAXIN) 500 MG tablet, Take 1 tablet (500 mg total) by mouth 2 (two) times daily as needed for muscle spasms., Disp: 20 tablet, Rfl: 0   omeprazole (PRILOSEC) 20 MG capsule, Take 1 capsule (20 mg total) by mouth daily., Disp: 90 capsule, Rfl: 3   ondansetron (ZOFRAN) 4 MG tablet, Take 1 tablet (4 mg total) by mouth every 8 (eight) hours as needed for nausea or vomiting., Disp: 20 tablet,  Rfl: 0   tamoxifen (NOLVADEX) 20 MG tablet, Take 1 tablet by mouth once daily (Patient taking differently: Stopped it for surgery and has not started it back), Disp: 90 tablet, Rfl: 3  Current Facility-Administered Medications:    0.9 %  sodium chloride infusion, 500 mL, Intravenous, Once, Jackquline Denmark, MD  Past Medical Problems: Past Medical History:  Diagnosis Date   Anxiety    Anxiety    Breast cancer ([place]) 2021   DDD (degenerative disc disease)    Migraine    Seizures ([place])     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    Past Surgical History: Past Surgical History:  Procedure Laterality Date   BREAST LUMPECTOMY     BREAST RECONSTRUCTION WITH PLACEMENT OF TISSUE EXPANDER AND FLEX HD (ACELLULAR HYDRATED DERMIS) Bilateral 03/25/2021   Procedure: BILATERAL BREAST RECONSTRUCTION WITH PLACEMENT OF TISSUE EXPANDER AND FLEX HD (ACELLULAR HYDRATED DERMIS);  Surgeon: Wallace Going, DO;  Location: [facility];  Service: Plastics;  Laterality: Bilateral;   CESAREAN SECTION     CHOLECYSTECTOMY     ENDOMETRIAL ABLATION     NIPPLE SPARING MASTECTOMY Bilateral 03/25/2021   Procedure: BILATERAL SIMPLE NIPPLE SPARING MASTECTOMY;  Surgeon: Pollyann Samples, MD;  Location: [facility];  Service: General;  Laterality: Bilateral;   TUBAL LIGATION      Social History: Social History   Socioeconomic History   Marital status: Married    Spouse name: Barbaraann Rondo   Number of children: 5   Years of education: Not on file   Highest education level: Not on file  Occupational History   Not on file  Tobacco Use   Smoking status: Former    Packs/day: 1.50    Years: 33.00    Pack years: 49.50    Types: Cigarettes    Start date: 08/23/1988    Quit date: 11/21/2020    Years since quitting: 0.5   Smokeless tobacco: Former   Tobacco comments:    also vape at times  Vaping Use   Vaping Use: Some days  Substance and Sexual Activity   Alcohol use: No   Drug use: No   Sexual activity: Yes  Other Topics Concern   Not on file  Social History Narrative   Not on file   Social Determinants of Health   Financial Resource Strain: Not on file  Food Insecurity: Not on file  Transportation Needs: Not on file  Physical Activity: Not on file  Stress: Not on file  Social Connections: Not on file  Intimate Partner Violence: Not on file    Family History: Family History  Problem Relation Age of Onset   Colitis Mother    Colon cancer Paternal Uncle     Diabetes Paternal Grandmother     Review of Systems: ROS Denies recent illness or infection  Physical Exam: Vital Signs BP 99/66 (BP Location: Left Arm, Patient Position: Sitting, Cuff Size: Normal)   Pulse 83   Ht 5' (1.524 m)   Wt 136 lb 3.2 oz (61.8 kg)   SpO2 100%   BMI 26.60 kg/m   Physical Exam Constitutional:      General: Not in acute distress.    Appearance: Normal appearance. Not ill-appearing.  HENT:     Head: Normocephalic and atraumatic.  Eyes:     Pupils: Pupils are equal, round Neck:     Musculoskeletal: Normal range of motion.  Cardiovascular:     Rate and Rhythm: Normal rate  Pulses: Normal pulses.  Pulmonary:     Effort: Pulmonary effort is normal. No respiratory distress.  Abdominal:     General: Abdomen is flat. There is no distension.  Musculoskeletal: Normal range of motion.  No lower extremity swelling or edema.  Peripheral pulses intact.  No varicosities. Skin:    General: Skin is warm and dry.     Findings: No erythema or rash.  Neurological:     General: No focal deficit present.     Mental Status: Alert and oriented to person, place, and time. Mental status is at baseline.     Motor: No weakness.  Psychiatric:        Mood and Affect: Mood normal.        Behavior: Behavior normal.    Assessment/Plan: The patient is scheduled for 07/01/2021 with Dr. Marla Roe.  Risks, benefits, and alternatives of procedure discussed, questions answered and consent obtained.    Smoking Status: Quit smoking cigarettes 8 months ago.  Occasionally vapes, she will abstain moving forward.    Caprini Score: 6, high risk; Risk Factors include: Previous malignancy, BMI greater than 25, age, and length of planned surgery. Recommendation for mechanical prophylaxis. Encourage early ambulation.   Pictures obtained: 04/24/2021.  Post-op Rx sent to pharmacy: Norco, Keflex, and Valium.  She reports that she has adequate amount of Zofran at home remaining.   Patient  was provided with the General Surgical Risk consent document and Pain Medication Agreement prior to their appointment.  They had adequate time to read through the risk consent documents and Pain Medication Agreement. We also discussed them in person together during this preop appointment. All of their questions were answered to their satisfaction.  Recommended calling if they have any further questions.  Risk consent form and Pain Medication Agreement to be scanned into patient's chart.  The risks that can be encountered with and after placement of a breast implant placement were discussed and include the following but not limited to these: bleeding, infection, delayed healing, anesthesia risks, skin sensation changes, injury to structures including nerves, blood vessels, and muscles which may be temporary or permanent, allergies to tape, suture materials and glues, blood products, topical preparations or injected agents, skin contour irregularities, skin discoloration and swelling, deep vein thrombosis, cardiac and pulmonary complications, pain, which may persist, fluid accumulation, wrinkling of the skin over the implant, changes in nipple or breast sensation, implant leakage or rupture, faulty position of the implant, persistent pain, formation of tight scar tissue around the implant (capsular contracture), possible need for revisional surgery or staged procedures.    Electronically signed by: Krista Blue, PA-C 06/09/2021 4:50 PM

## 2021-06-23 ENCOUNTER — Other Ambulatory Visit: Payer: Self-pay

## 2021-06-23 ENCOUNTER — Encounter (HOSPITAL_BASED_OUTPATIENT_CLINIC_OR_DEPARTMENT_OTHER): Payer: Self-pay | Admitting: Plastic Surgery

## 2021-07-01 ENCOUNTER — Encounter (HOSPITAL_BASED_OUTPATIENT_CLINIC_OR_DEPARTMENT_OTHER)
Admission: RE | Disposition: A | Discharge status: Home / Self Care | Payer: Self-pay | Source: Home / Self Care | Attending: Plastic Surgery

## 2021-07-01 ENCOUNTER — Ambulatory Visit (HOSPITAL_BASED_OUTPATIENT_CLINIC_OR_DEPARTMENT_OTHER): Payer: Medicare Other | Admitting: Certified Registered"

## 2021-07-01 ENCOUNTER — Ambulatory Visit (HOSPITAL_BASED_OUTPATIENT_CLINIC_OR_DEPARTMENT_OTHER)
Admission: RE | Admit: 2021-07-01 | Discharge: 2021-07-01 | Disposition: A | Discharge status: Home / Self Care | Payer: Medicare Other | Attending: Plastic Surgery | Admitting: Plastic Surgery

## 2021-07-01 ENCOUNTER — Other Ambulatory Visit: Payer: Self-pay

## 2021-07-01 ENCOUNTER — Encounter (HOSPITAL_BASED_OUTPATIENT_CLINIC_OR_DEPARTMENT_OTHER): Payer: Self-pay | Admitting: Plastic Surgery

## 2021-07-01 DIAGNOSIS — F319 Bipolar disorder, unspecified: Secondary | ICD-10-CM | POA: Insufficient documentation

## 2021-07-01 DIAGNOSIS — Z853 Personal history of malignant neoplasm of breast: Secondary | ICD-10-CM | POA: Diagnosis not present

## 2021-07-01 DIAGNOSIS — Z9013 Acquired absence of bilateral breasts and nipples: Secondary | ICD-10-CM | POA: Insufficient documentation

## 2021-07-01 DIAGNOSIS — C50411 Malignant neoplasm of upper-outer quadrant of right female breast: Secondary | ICD-10-CM | POA: Diagnosis not present

## 2021-07-01 DIAGNOSIS — Z45812 Encounter for adjustment or removal of left breast implant: Secondary | ICD-10-CM | POA: Diagnosis present

## 2021-07-01 DIAGNOSIS — Z87891 Personal history of nicotine dependence: Secondary | ICD-10-CM | POA: Insufficient documentation

## 2021-07-01 DIAGNOSIS — F419 Anxiety disorder, unspecified: Secondary | ICD-10-CM | POA: Insufficient documentation

## 2021-07-01 DIAGNOSIS — Z45811 Encounter for adjustment or removal of right breast implant: Secondary | ICD-10-CM | POA: Insufficient documentation

## 2021-07-01 DIAGNOSIS — K219 Gastro-esophageal reflux disease without esophagitis: Secondary | ICD-10-CM | POA: Diagnosis not present

## 2021-07-01 DIAGNOSIS — Z170 Estrogen receptor positive status [ER+]: Secondary | ICD-10-CM | POA: Diagnosis not present

## 2021-07-01 HISTORY — DX: Unspecified asthma, uncomplicated: J45.909

## 2021-07-01 HISTORY — PX: REMOVAL OF BILATERAL TISSUE EXPANDERS WITH PLACEMENT OF BILATERAL BREAST IMPLANTS: SHX6431

## 2021-07-01 LAB — BASIC METABOLIC PANEL
Anion gap: 9 (ref 5–15)
BUN: 10 mg/dL (ref 6–20)
CO2: 25 mmol/L (ref 22–32)
Calcium: 8.8 mg/dL — ABNORMAL LOW (ref 8.9–10.3)
Chloride: 103 mmol/L (ref 98–111)
Creatinine, Ser: 0.71 mg/dL (ref 0.44–1.00)
GFR, Estimated: >60 mL/min (ref >60)
Glucose, Bld: 101 mg/dL — ABNORMAL HIGH (ref 70–99)
Potassium: 3.8 mmol/L (ref 3.5–5.1)
Sodium: 137 mmol/L (ref 135–145)

## 2021-07-01 LAB — POCT PREGNANCY, URINE: Preg Test, Ur: NEGATIVE

## 2021-07-01 SURGERY — REMOVAL, TISSUE EXPANDER, BREAST, BILATERAL, WITH BILATERAL IMPLANT IMPLANT INSERTION
Anesthesia: General | Site: Breast | Laterality: Bilateral

## 2021-07-01 MED ORDER — OXYCODONE HCL 5 MG PO TABS
ORAL_TABLET | ORAL | Status: AC
  Filled 2021-07-01: qty 1

## 2021-07-01 MED ORDER — LIDOCAINE-EPINEPHRINE 1 %-1:100000 IJ SOLN
INTRAMUSCULAR | Status: AC
  Filled 2021-07-01: qty 1

## 2021-07-01 MED ORDER — FENTANYL CITRATE (PF) 100 MCG/2ML IJ SOLN
INTRAMUSCULAR | Status: AC
  Filled 2021-07-01: qty 2

## 2021-07-01 MED ORDER — EPHEDRINE SULFATE 50 MG/ML IJ SOLN
INTRAMUSCULAR | Status: DC | PRN
  Administered 2021-07-01 (×2): 10 mg via INTRAVENOUS

## 2021-07-01 MED ORDER — PROPOFOL 10 MG/ML IV BOLUS
INTRAVENOUS | Status: DC | PRN
  Administered 2021-07-01: 150 mg via INTRAVENOUS

## 2021-07-01 MED ORDER — MIDAZOLAM HCL 5 MG/5ML IJ SOLN
INTRAMUSCULAR | Status: DC | PRN
  Administered 2021-07-01: 2 mg via INTRAVENOUS

## 2021-07-01 MED ORDER — ATROPINE SULFATE 0.4 MG/ML IV SOLN
INTRAVENOUS | Status: AC
  Filled 2021-07-01: qty 1

## 2021-07-01 MED ORDER — ONDANSETRON HCL 4 MG/2ML IJ SOLN
INTRAMUSCULAR | Status: DC | PRN
  Administered 2021-07-01: 4 mg via INTRAVENOUS

## 2021-07-01 MED ORDER — DEXAMETHASONE SODIUM PHOSPHATE 4 MG/ML IJ SOLN
INTRAMUSCULAR | Status: DC | PRN
  Administered 2021-07-01: 10 mg via INTRAVENOUS

## 2021-07-01 MED ORDER — FENTANYL CITRATE (PF) 100 MCG/2ML IJ SOLN
25.0000 ug | INTRAMUSCULAR | Status: DC | PRN
  Administered 2021-07-01: 50 ug via INTRAVENOUS

## 2021-07-01 MED ORDER — LACTATED RINGERS IV SOLN: INTRAVENOUS | Status: DC

## 2021-07-01 MED ORDER — EPHEDRINE 5 MG/ML INJ
INTRAVENOUS | Status: AC
  Filled 2021-07-01: qty 5

## 2021-07-01 MED ORDER — CEFAZOLIN SODIUM-DEXTROSE 2-4 GM/100ML-% IV SOLN
2.0000 g | INTRAVENOUS | Status: AC
  Administered 2021-07-01: 2 g via INTRAVENOUS

## 2021-07-01 MED ORDER — SUCCINYLCHOLINE CHLORIDE 200 MG/10ML IV SOSY
PREFILLED_SYRINGE | INTRAVENOUS | Status: AC
  Filled 2021-07-01: qty 10

## 2021-07-01 MED ORDER — DEXAMETHASONE SODIUM PHOSPHATE 10 MG/ML IJ SOLN
INTRAMUSCULAR | Status: AC
  Filled 2021-07-01: qty 1

## 2021-07-01 MED ORDER — LIDOCAINE-EPINEPHRINE 1 %-1:100000 IJ SOLN
INTRAMUSCULAR | Status: DC | PRN
  Administered 2021-07-01 (×2): 10 mL

## 2021-07-01 MED ORDER — PHENYLEPHRINE 40 MCG/ML (10ML) SYRINGE FOR IV PUSH (FOR BLOOD PRESSURE SUPPORT)
PREFILLED_SYRINGE | INTRAVENOUS | Status: AC
  Filled 2021-07-01: qty 10

## 2021-07-01 MED ORDER — LIDOCAINE HCL (CARDIAC) PF 100 MG/5ML IV SOSY
PREFILLED_SYRINGE | INTRAVENOUS | Status: DC | PRN
  Administered 2021-07-01: 60 mg via INTRAVENOUS

## 2021-07-01 MED ORDER — PROMETHAZINE HCL 25 MG/ML IJ SOLN
INTRAMUSCULAR | Status: AC
  Filled 2021-07-01: qty 1

## 2021-07-01 MED ORDER — FENTANYL CITRATE (PF) 100 MCG/2ML IJ SOLN
INTRAMUSCULAR | Status: DC | PRN
  Administered 2021-07-01: 25 ug via INTRAVENOUS
  Administered 2021-07-01: 50 ug via INTRAVENOUS
  Administered 2021-07-01: 25 ug via INTRAVENOUS
  Administered 2021-07-01 (×2): 50 ug via INTRAVENOUS

## 2021-07-01 MED ORDER — PROMETHAZINE HCL 25 MG/ML IJ SOLN
6.2500 mg | INTRAMUSCULAR | Status: DC | PRN
  Administered 2021-07-01: 6.25 mg via INTRAVENOUS

## 2021-07-01 MED ORDER — BUPIVACAINE HCL (PF) 0.25 % IJ SOLN
INTRAMUSCULAR | Status: AC
  Filled 2021-07-01: qty 30

## 2021-07-01 MED ORDER — BUPIVACAINE HCL (PF) 0.25 % IJ SOLN
INTRAMUSCULAR | Status: DC | PRN
  Administered 2021-07-01 (×2): 15 mL

## 2021-07-01 MED ORDER — ONDANSETRON HCL 4 MG/2ML IJ SOLN
INTRAMUSCULAR | Status: AC
  Filled 2021-07-01: qty 2

## 2021-07-01 MED ORDER — LIDOCAINE 2% (20 MG/ML) 5 ML SYRINGE
INTRAMUSCULAR | Status: AC
  Filled 2021-07-01: qty 5

## 2021-07-01 MED ORDER — CHLORHEXIDINE GLUCONATE CLOTH 2 % EX PADS: 6.0000 | MEDICATED_PAD | Freq: Once | CUTANEOUS | Status: DC

## 2021-07-01 MED ORDER — CEFAZOLIN SODIUM-DEXTROSE 2-4 GM/100ML-% IV SOLN
INTRAVENOUS | Status: AC
  Filled 2021-07-01: qty 100

## 2021-07-01 MED ORDER — OXYCODONE HCL 5 MG/5ML PO SOLN: 5.0000 mg | Freq: Once | ORAL | Status: AC | PRN

## 2021-07-01 MED ORDER — MIDAZOLAM HCL 2 MG/2ML IJ SOLN
INTRAMUSCULAR | Status: AC
  Filled 2021-07-01: qty 2

## 2021-07-01 MED ORDER — OXYCODONE HCL 5 MG PO TABS
5.0000 mg | ORAL_TABLET | Freq: Once | ORAL | Status: AC | PRN
  Administered 2021-07-01: 5 mg via ORAL

## 2021-07-01 SURGICAL SUPPLY — 68 items
ADH SKN CLS APL DERMABOND .7 (GAUZE/BANDAGES/DRESSINGS)
BAG DECANTER FOR FLEXI CONT (MISCELLANEOUS) ×2 IMPLANT
BINDER BREAST LRG (GAUZE/BANDAGES/DRESSINGS) IMPLANT
BINDER BREAST MEDIUM (GAUZE/BANDAGES/DRESSINGS) IMPLANT
BINDER BREAST XLRG (GAUZE/BANDAGES/DRESSINGS) IMPLANT
BINDER BREAST XXLRG (GAUZE/BANDAGES/DRESSINGS) IMPLANT
BIOPATCH RED 1 DISK 7.0 (GAUZE/BANDAGES/DRESSINGS) IMPLANT
BLADE HEX COATED 2.75 (ELECTRODE) ×2 IMPLANT
BLADE SURG 15 STRL LF DISP TIS (BLADE) ×2 IMPLANT
BLADE SURG 15 STRL SS (BLADE) ×4
CANISTER SUCT 1200ML W/VALVE (MISCELLANEOUS) ×2 IMPLANT
COVER BACK TABLE 60X90IN (DRAPES) ×2 IMPLANT
COVER MAYO STAND STRL (DRAPES) ×2 IMPLANT
DECANTER SPIKE VIAL GLASS SM (MISCELLANEOUS) IMPLANT
DERMABOND ADVANCED (GAUZE/BANDAGES/DRESSINGS)
DERMABOND ADVANCED .7 DNX12 (GAUZE/BANDAGES/DRESSINGS) IMPLANT
DRAIN CHANNEL 19F RND (DRAIN) IMPLANT
DRAPE LAPAROSCOPIC ABDOMINAL (DRAPES) ×2 IMPLANT
DRSG OPSITE POSTOP 4X6 (GAUZE/BANDAGES/DRESSINGS) ×8 IMPLANT
DRSG PAD ABDOMINAL 8X10 ST (GAUZE/BANDAGES/DRESSINGS) ×4 IMPLANT
ELECT BLADE 4.0 EZ CLEAN MEGAD (MISCELLANEOUS) ×2
ELECT REM PT RETURN 9FT ADLT (ELECTROSURGICAL) ×2
ELECTRODE BLDE 4.0 EZ CLN MEGD (MISCELLANEOUS) ×1 IMPLANT
ELECTRODE REM PT RTRN 9FT ADLT (ELECTROSURGICAL) ×1 IMPLANT
EVACUATOR SILICONE 100CC (DRAIN) IMPLANT
FUNNEL KELLER 2 DISP (MISCELLANEOUS) IMPLANT
GAUZE SPONGE 4X4 12PLY STRL LF (GAUZE/BANDAGES/DRESSINGS) IMPLANT
GLOVE SURG ENC MOIS LTX SZ6.5 (GLOVE) ×6 IMPLANT
GLOVE SURG ENC MOIS LTX SZ7 (GLOVE) ×2 IMPLANT
GOWN STRL REUS W/ TWL LRG LVL3 (GOWN DISPOSABLE) ×2 IMPLANT
GOWN STRL REUS W/TWL LRG LVL3 (GOWN DISPOSABLE) ×4
IMPL BREAST GEL HP 400CC (Breast) ×2 IMPLANT
IMPLANT BREAST GEL HP 400CC (Breast) ×4 IMPLANT
IV NS 1000ML (IV SOLUTION)
IV NS 1000ML BAXH (IV SOLUTION) IMPLANT
IV NS 500ML (IV SOLUTION)
IV NS 500ML BAXH (IV SOLUTION) IMPLANT
KIT FILL SYSTEM UNIVERSAL (SET/KITS/TRAYS/PACK) IMPLANT
NDL SAFETY ECLIPSE 18X1.5 (NEEDLE) ×1 IMPLANT
NEEDLE HYPO 18GX1.5 SHARP (NEEDLE) ×2
NEEDLE HYPO 25X1 1.5 SAFETY (NEEDLE) ×2 IMPLANT
PACK BASIN DAY SURGERY FS (CUSTOM PROCEDURE TRAY) ×2 IMPLANT
PENCIL SMOKE EVACUATOR (MISCELLANEOUS) ×2 IMPLANT
PIN SAFETY STERILE (MISCELLANEOUS) IMPLANT
SIZER BREAST REUSE 375CC (SIZER) ×2
SIZER BREAST REUSE 400CC (SIZER) ×2
SIZER BRST REUSE 400CC (SIZER) ×1 IMPLANT
SIZER BRST REUSE P5.3 375CC (SIZER) ×1 IMPLANT
SLEEVE SCD COMPRESS KNEE MED (STOCKING) ×2 IMPLANT
SPONGE T-LAP 18X18 ~~LOC~~+RFID (SPONGE) ×4 IMPLANT
STRIP SUTURE WOUND CLOSURE 1/2 (MISCELLANEOUS) IMPLANT
SUT MNCRL AB 4-0 PS2 18 (SUTURE) ×4 IMPLANT
SUT MON AB 3-0 SH 27 (SUTURE) ×8
SUT MON AB 3-0 SH27 (SUTURE) ×4 IMPLANT
SUT MON AB 5-0 PS2 18 (SUTURE) ×2 IMPLANT
SUT PDS 3-0 CT2 (SUTURE) ×4
SUT PDS AB 2-0 CT2 27 (SUTURE) IMPLANT
SUT PDS II 3-0 CT2 27 ABS (SUTURE) ×2 IMPLANT
SUT VIC AB 3-0 SH 27 (SUTURE)
SUT VIC AB 3-0 SH 27X BRD (SUTURE) IMPLANT
SUT VICRYL 4-0 PS2 18IN ABS (SUTURE) IMPLANT
SYR BULB IRRIG 60ML STRL (SYRINGE) ×2 IMPLANT
SYR CONTROL 10ML LL (SYRINGE) ×2 IMPLANT
TOWEL GREEN STERILE FF (TOWEL DISPOSABLE) ×4 IMPLANT
TRAY DSU PREP LF (CUSTOM PROCEDURE TRAY) ×2 IMPLANT
TUBE CONNECTING 20X1/4 (TUBING) ×2 IMPLANT
UNDERPAD 30X36 HEAVY ABSORB (UNDERPADS AND DIAPERS) ×4 IMPLANT
YANKAUER SUCT BULB TIP NO VENT (SUCTIONS) ×2 IMPLANT

## 2021-07-01 NOTE — Discharge Instructions (Addendum)
INSTRUCTIONS FOR AFTER SURGERY   You will likely have some questions about what to expect following your operation.  The following information will help you and your family understand what to expect when you are discharged from the hospital.  Following these guidelines will help ensure a smooth recovery and reduce risks of complications.  Postoperative instructions include information on: diet, wound care, medications and physical activity.  AFTER SURGERY Expect to go home after the procedure.  In some cases, you may need to spend one night in the hospital for observation.  DIET This surgery does not require a specific diet.  However, I have to mention that the healthier you eat the better your body can start healing. It is important to increasing your protein intake.  This means limiting the foods with added sugar.  Focus on fruits and vegetables and some meat. It is very important to drink water after your surgery.  If your urine is bright yellow, then it is concentrated, and you need to drink more water.  As a general rule after surgery, you should have 8 ounces of water every hour while awake.  If you find you are persistently nauseated or unable to take in liquids let us know.  NO TOBACCO USE or EXPOSURE.  This will slow your healing process and increase the risk of a wound.  WOUND CARE If you don't have a drain: You can shower the day after surgery.  Use fragrance free soap.  Dial, [place], [place] and Cetaphil are usually mild on the skin.  If you have steri-strips / tape directly attached to your skin leave them in place. It is OK to get these wet.  No baths, pools or hot tubs for two weeks. We close your incision to leave the smallest and best-looking scar. No ointment or creams on your incisions until given the go ahead.  Especially not Neosporin (Too many skin reactions with this one).  A few weeks after surgery you can use Mederma and start massaging the scar. We ask you to wear your binder or  sports bra for the first 6 weeks around the clock, including while sleeping. This provides added comfort and helps reduce the fluid accumulation at the surgery site.  ACTIVITY No heavy lifting until cleared by the doctor.  It is OK to walk and climb stairs. In fact, moving your legs is very important to decrease your risk of a blood clot.  It will also help keep you from getting deconditioned.  Every 1 to 2 hours get up and walk for 5 minutes. This will help with a quicker recovery back to normal.  Let pain be your guide so you don't do too much.  NO, you cannot do the spring cleaning and don't plan on taking care of anyone else.  This is your time for TLC.   WORK Everyone returns to work at different times. As a rough guide, most people take at least 1 - 2 weeks off prior to returning to work. If you need documentation for your job, bring the forms to your postoperative follow up visit.  DRIVING Arrange for someone to bring you home from the hospital.  You may be able to drive a few days after surgery but not while taking any narcotics or valium.  BOWEL MOVEMENTS Constipation can occur after anesthesia and while taking pain medication.  It is important to stay ahead for your comfort.  We recommend taking Milk of Magnesia (2 tablespoons; twice a day) while taking  the pain pills.  SEROMA This is fluid your body tried to put in the surgical site.  This is normal but if it creates excessive pain and swelling let us know.  It usually decreases in a few weeks.  MEDICATIONS and PAIN CONTROL At your preoperative visit for you history and physical you were given the following medications: An antibiotic: Start this medication when you get home and take according to the instructions on the bottle. Zofran 4 mg:  This is to treat nausea and vomiting.  You can take this every 6 hours as needed and only if needed. Norco (hydrocodone/acetaminophen) 5/325 mg:  This is only to be used after you have taken the  motrin or the tylenol. Every 8 hours as needed. Over the counter Medication to take: Ibuprofen (Motrin) 600 mg:  Take this every 6 hours.  If you have additional pain then take 500 mg of the tylenol.  Only take the Norco after you have tried these two. Miralax or stool softener of choice: Take this according to the bottle if you take the [place] Call your surgeon's office if any of the following occur:  Fever 101 degrees F or greater  Excessive bleeding or fluid from the incision site.  Pain that increases over time without aid from the medications  Redness, warmth, or pus draining from incision sites  Persistent nausea or inability to take in liquids  Severe misshapen area that underwent the operation.  Post Anesthesia Home Care Instructions  Activity: Get plenty of rest for the remainder of the day. A responsible individual must stay with you for 24 hours following the procedure.  For the next 24 hours, DO NOT: -Drive a car -Paediatric nurse -Drink alcoholic beverages -Take any medication unless instructed by your physician -Make any legal decisions or sign important papers.  Meals: Start with liquid foods such as gelatin or soup. Progress to regular foods as tolerated. Avoid greasy, spicy, heavy foods. If nausea and/or vomiting occur, drink only clear liquids until the nausea and/or vomiting subsides. Call your physician if vomiting continues.  Special Instructions/Symptoms: Your throat may feel dry or sore from the anesthesia or the breathing tube placed in your throat during surgery. If this causes discomfort, gargle with warm salt water. The discomfort should disappear within 24 hours.  If you had a scopolamine patch placed behind your ear for the management of post- operative nausea and/or vomiting:  1. The medication in the patch is effective for 72 hours, after which it should be removed.  Wrap patch in a tissue and discard in the trash. Wash hands thoroughly  with soap and water. 2. You may remove the patch earlier than 72 hours if you experience unpleasant side effects which may include dry mouth, dizziness or visual disturbances. 3. Avoid touching the patch. Wash your hands with soap and water after contact with the patch.

## 2021-07-01 NOTE — Anesthesia Preprocedure Evaluation (Addendum)
Anesthesia Evaluation  Patient identified by MRN, date of birth, ID band Patient awake    Reviewed: Allergy & Precautions, NPO status , Patient's Chart, lab work & pertinent test results  History of Anesthesia Complications Negative for: history of anesthetic complications  Airway Mallampati: II  TM Distance: >3 FB Neck ROM: Full    Dental  (+) Edentulous Upper, Edentulous Lower   Pulmonary Patient abstained from smoking., former smoker,    Pulmonary exam normal        Cardiovascular negative cardio ROS Normal cardiovascular exam     Neuro/Psych  Headaches, PSYCHIATRIC DISORDERS Anxiety Depression Bipolar Disorder  Hx pseudoseizures     GI/Hepatic Neg liver ROS, GERD  Medicated and Controlled,  Endo/Other   Luetscher's syndrome (Hyperaldosteronism) - denies hx    Renal/GU negative Renal ROS     Musculoskeletal  (+) Arthritis ,   Abdominal   Peds  Hematology negative hematology ROS (+)   Anesthesia Other Findings   Reproductive/Obstetrics  Breast cancer s/p tubal ligation                            Anesthesia Physical Anesthesia Plan  ASA: 2  Anesthesia Plan: General   Post-op Pain Management:    Induction: Intravenous  PONV Risk Score and Plan: 3 and Treatment may vary due to age or medical condition, Ondansetron, Dexamethasone and Midazolam  Airway Management Planned: LMA  Additional Equipment: None  Intra-op Plan:   Post-operative Plan: Extubation in OR  Informed Consent: I have reviewed the patients History and Physical, chart, labs and discussed the procedure including the risks, benefits and alternatives for the proposed anesthesia with the patient or authorized representative who has indicated his/her understanding and acceptance.     Dental advisory given  Plan Discussed with: CRNA and Anesthesiologist  Anesthesia Plan Comments:        Anesthesia  Quick Evaluation

## 2021-07-01 NOTE — Transfer of Care (Signed)
Immediate Anesthesia Transfer of Care Note  Patient: Brianna Merritt  Procedure(s) Performed: REMOVAL OF BILATERAL TISSUE EXPANDERS WITH PLACEMENT OF BILATERAL BREAST IMPLANTS (Bilateral: Breast)  Patient Location: PACU  Anesthesia Type:General  Level of Consciousness: awake, alert , oriented, drowsy and patient cooperative  Airway & Oxygen Therapy: Patient Spontanous Breathing and Patient connected to face mask oxygen  Post-op Assessment: Report given to RN and Post -op Vital signs reviewed and stable  Post vital signs: Reviewed and stable  Last Vitals:  Vitals Value Taken Time  BP    Temp    Pulse 85 07/01/21 1219  Resp    SpO2 99 % 07/01/21 1219  Vitals shown include unvalidated device data.  Last Pain:  Vitals:   07/01/21 1022  TempSrc: Oral  PainSc: 0-No pain      Patients Stated Pain Goal: 4 ([id number])  Complications: No notable events documented.

## 2021-07-01 NOTE — Anesthesia Procedure Notes (Signed)
Procedure Name: LMA Insertion Date/Time: 07/01/2021 11:01 AM Performed by: Willa Frater, CRNA Pre-anesthesia Checklist: Patient identified, Emergency Drugs available, Suction available and Patient being monitored Patient Re-evaluated:Patient Re-evaluated prior to induction Oxygen Delivery Method: Circle system utilized Preoxygenation: Pre-oxygenation with 100% oxygen Induction Type: IV induction Ventilation: Mask ventilation without difficulty LMA: LMA inserted LMA Size: 3.0 Number of attempts: 1 Airway Equipment and Method: Bite block Placement Confirmation: positive ETCO2 Tube secured with: Tape Dental Injury: Teeth and Oropharynx as per pre-operative assessment

## 2021-07-01 NOTE — Interval H&P Note (Signed)
History and Physical Interval Note:  07/01/2021 10:21 AM  Brianna Merritt  has presented today for surgery, with the diagnosis of Ductal carcinoma in situ of left breast.  The various methods of treatment have been discussed with the patient and family. After consideration of risks, benefits and other options for treatment, the patient has consented to  Procedure(s) with comments: REMOVAL OF BILATERAL TISSUE EXPANDERS WITH PLACEMENT OF BILATERAL BREAST IMPLANTS (Bilateral) - 2 hours as a surgical intervention.  The patient's history has been reviewed, patient examined, no change in status, stable for surgery.  I have reviewed the patient's chart and labs.  Questions were answered to the patient's satisfaction.     Loel Lofty Keionna Kinnaird

## 2021-07-01 NOTE — Op Note (Signed)
Op report Bilateral Exchange   DATE OF OPERATION: 07/01/2021  LOCATION: [place]  SURGICAL DIVISION: Plastic Surgery  PREOPERATIVE DIAGNOSIS:  1.History of breast cancer.  2. Acquired absence of bilateral breast.   POSTOPERATIVE DIAGNOSIS:  1. History of breast cancer.  2. Acquired absence of bilateral breast.   PROCEDURE:  1. Bilateral exchange of tissue expanders for implants.  2. Bilateral capsulotomies for implant respositioning.  SURGEON: Javeria Briski Sanger Lavonta Tillis, DO  ASSISTANT: Dr. Lennice Sites and Krista Blue, PA  ANESTHESIA:  General.   COMPLICATIONS: None.   IMPLANTS: Left - [employer]or Smooth Round Ultra High Profile Gel 400cc. Ref #[id number].  Serial Number [id number] Right - [employer]or Smooth Round Ultra High Profile Gel 400cc. Ref #[id number].  Serial Number [id number]  INDICATIONS FOR PROCEDURE:  The patient, Brianna Merritt, is a 45 y.o. female born on 12-May-1976, is here for treatment after bilateral mastectomies.  She had tissue expanders placed at the time of mastectomies. She now presents for exchange of her expanders for implants.  She requires capsulotomies to better position the implants. MRN: [id number]  CONSENT:  Informed consent was obtained directly from the patient. Risks, benefits and alternatives were fully discussed. Specific risks including but not limited to bleeding, infection, hematoma, seroma, scarring, pain, implant infection, implant extrusion, capsular contracture, asymmetry, wound healing problems, and need for further surgery were all discussed. The patient did have an ample opportunity to have her questions answered to her satisfaction.   DESCRIPTION OF PROCEDURE:  The patient was taken to the operating room. SCDs were placed and IV antibiotics were given. The patient's chest was prepped and draped in a sterile fashion. A time out was performed and the implants to be used were identified.    On the right breast:  Local with epinephrine was used to infiltrate at the incision site. The old mastectomy scar was not utilized.  The inframammary fold was chosen to avoid the area directly radiated. The inframammary fold was incised for 3 cm and the superior and inferior flaps were raised over the ADM for several centimeters to minimize tension for the closure. The ADM was split to expose and remove the tissue expander.  Inspection of the pocket showed a normal healthy capsule and good integration of the biologic matrix.  The pocket was irrigated with antibiotic solution.  The ADM released from the muscle but stayed connected superior lateral and inferior medial.  New ADM was not applied. This was kept intact.  Circumferential capsulotomies were performed to allow for breast pocket expansion.  Measurements were made and a sizer used to confirm adequate pocket size for the implant dimensions.  Hemostasis was ensured with electrocautery. New gloves were placed. The implant was soaked in antibiotic solution and then placed in the pocket and oriented appropriately. The pectoralis major muscle and capsule on the anterior surface were re-closed with a 3-0 PDS suture. The remaining skin was closed with 3-0 Monocryl deep dermal and 4-0 Monocryl subcuticular stitches.   On the left breast: The old mastectomy scar was incised.  The mastectomy flaps from the superior and inferior flaps were raised over the pectoralis major muscle and ADM for several centimeters to minimize tension for the closure. The ADM was split diagonal to the skin incision to expose and remove the tissue expander.  Inspection of the pocket showed a normal healthy capsule and good integration of the biologic matrix.   Circumferential capsulotomies were performed to allow for breast pocket expansion.  Measurements were made and  a sizer utilized to confirm adequate pocket size for the implant dimensions.  Hemostasis was ensured with the electrocautery.  New gloves were  applied. The implant was soaked in antibiotic solution and placed in the pocket and oriented appropriately. The pectoralis major muscle and capsule on the anterior surface were re-closed with a 3-0 PDS suture. The remaining skin was closed with 3-0 Monocryl deep dermal and 4-0 Monocryl subcuticular stitches.  Dermabond was applied to the incision site. A breast binder and ABDs were placed.  The patient was allowed to wake from anesthesia and taken to the recovery room in satisfactory condition.   The advanced practice practitioner (APP) assisted throughout the case.  The APP was essential in retraction and counter traction when needed to make the case progress smoothly.  This retraction and assistance made it possible to see the tissue plans for the procedure.  The assistance was needed for blood control, tissue re-approximation and assisted with closure of the incision site.

## 2021-07-02 ENCOUNTER — Encounter (HOSPITAL_BASED_OUTPATIENT_CLINIC_OR_DEPARTMENT_OTHER): Payer: Self-pay | Admitting: Plastic Surgery

## 2021-07-02 NOTE — Anesthesia Postprocedure Evaluation (Signed)
Anesthesia Post Note  Patient: Brianna Merritt  Procedure(s) Performed: REMOVAL OF BILATERAL TISSUE EXPANDERS WITH PLACEMENT OF BILATERAL BREAST IMPLANTS (Bilateral: Breast)     Patient location during evaluation: PACU Anesthesia Type: General Level of consciousness: awake and alert Pain management: pain level controlled Vital Signs Assessment: post-procedure vital signs reviewed and stable Respiratory status: spontaneous breathing, nonlabored ventilation and respiratory function stable Cardiovascular status: stable and blood pressure returned to baseline Anesthetic complications: no   No notable events documented.  Last Vitals:  Vitals:   07/01/21 1245 07/01/21 1326  BP: 112/73 (!) 126/92  Pulse: 69 67  Resp: 10 14  Temp:  36.4 C  SpO2: 95% 96%                   Audry Pili

## 2021-07-09 ENCOUNTER — Encounter (HOSPITAL_BASED_OUTPATIENT_CLINIC_OR_DEPARTMENT_OTHER): Payer: Self-pay | Admitting: Plastic Surgery

## 2021-07-09 MED ORDER — SODIUM CHLORIDE 0.9 % IV SOLN: INTRAVENOUS | Status: DC | PRN

## 2021-07-10 ENCOUNTER — Other Ambulatory Visit: Payer: Self-pay

## 2021-07-10 ENCOUNTER — Ambulatory Visit (INDEPENDENT_AMBULATORY_CARE_PROVIDER_SITE_OTHER): Payer: Medicare Other | Admitting: Physician Assistant

## 2021-07-10 ENCOUNTER — Encounter: Payer: Medicare Other | Admitting: Plastic Surgery

## 2021-07-10 DIAGNOSIS — Z9889 Other specified postprocedural states: Secondary | ICD-10-CM

## 2021-07-10 NOTE — Progress Notes (Signed)
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 Marla Roe.  She presents today for her 1 week postoperative visit.  Today, she is doing well.  Accompanied by her husband and daughter at bedside.  She states that this phase of the reconstruction is entirely more tolerable in the last phase.  Her pain is well controlled with over-the-counter analgesics in addition to occasional Norco as needed.  She denies any fevers, redness, significant drainage, or other symptoms.  She reports that her dressings are still intact.  Patient does report asymmetry in her breasts.  Her right breast appears to be much higher, left breast appears more ptotic.  The nipple position on left side also appears to be lower than the right.  Physical exam performed without evidence concerning for dehiscence or infection.  The dressings all remain firmly intact.  No redness or streaking.  No obvious subcutaneous fluid collections.  No asymmetric swelling.  No visible drainage.  Her asymmetry could be partially attributable to the dressings.  The right breast dressing is placed over inframammary incision and could be lifting that breast whereas the left breast honeycomb dressing is over the center of the breast.  She admits to walking the dog as well as daily vaping with nicotine product.  She understands the risks of poor wound healing in the context of nicotine and is making an effort to eliminate her smoking.  With regard to the dog, she understands that if he pulls the lesion could cause injury.  She also is abstaining from any lifting and is trying to modify her activities.  She continues with compressive wrap.  Aside from her current asymmetry, she is otherwise quite pleased.  She already has a follow-up appointment scheduled for 10 to 12 days from now.  Obtain pictures at that visit.

## 2021-07-14 ENCOUNTER — Telehealth: Payer: Self-pay | Admitting: *Deleted

## 2021-07-14 NOTE — Telephone Encounter (Signed)
Received on (07/09/21) DME Standard Written Order via of fax from [facility].  Requesting signature and return.  Given to provider to sign and return.    DME Standard Written Order signed and faxed back to [facility].  Confirmation received and copy scanned into the chart.//AB/CMA

## 2021-07-20 NOTE — Progress Notes (Signed)
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 Marla Roe.    She was seen for initial postoperative appointment on 07/10/2021.  At that time, her pain had been well controlled with over-the-counter analgesics in addition to occasional Norco as needed.  Patient complained of asymmetry, felt as though her right breast was higher on her chest.  Physical exam was reassuring, dressings all remained firmly intact.  Asymmetry could be partially attributable to the dressings.  Today, patient reports that she is doing well.  She does endorse a fullness "pressure" in each breast to which she states she is getting accustomed.  She denies any significant pain symptoms and states that she is now only taking ibuprofen as needed for discomfort.  The honeycomb dressing on the right side fell off spontaneously in the left side has also begun to peel off due to moisture.  She still endorses the asymmetry and nipple position.  Denies any fevers, chills, drainage, wounds, or other symptoms.  From a healing standpoint, physical exam is entirely reassuring.  Steri-Strips still remain firmly intact.  Left honeycomb dressing was removed without complication.  Skin looks excellent, no redness or ecchymoses appreciated.  No obvious subcutaneous fluid.  Good shape.  Mild asymmetry with left breast slightly more ptotic.  She has had right-sided radiation.  Her nipple position on left breast also appears to be more downward pointing than the right side which makes the asymmetry appear worse than it is.    Recommending massaging her right breast inferiorly.  Continue ibuprofen as needed.  Otherwise, she is doing an excellent job from healing standpoint.  No evidence of infection or seroma.  She can return to clinic in 3 weeks.    Picture(s) obtained of the patient and placed in the chart were with the  patient's or guardian's permission.

## 2021-07-21 ENCOUNTER — Ambulatory Visit (INDEPENDENT_AMBULATORY_CARE_PROVIDER_SITE_OTHER): Payer: Medicare Other | Admitting: Physician Assistant

## 2021-07-21 ENCOUNTER — Other Ambulatory Visit: Payer: Self-pay

## 2021-07-21 DIAGNOSIS — Z9889 Other specified postprocedural states: Secondary | ICD-10-CM

## 2021-07-27 NOTE — Progress Notes (Addendum)
No care team member to display  Clinic Day:  07/28/2021  Referring physician: Jeanie Sewer, NP  ASSESSMENT & PLAN:   Assessment & Plan: Malignant neoplasm of upper-outer quadrant of right female breast ([place]) Stage IA (T1b N0 M0) hormone receptor positive breast cancer, treated with lumpectomy and adjuvant radiation.  She continues adjuvant hormonal therapy with tamoxifen daily and is tolerating this well, except for fatigue.  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  This is a high risk gene related to an increased risk of breast cancer with her cancer risk being 20-31% when compared to the risk for the general population of 10.2%.  Her risk for developing a secondary primary within 10 years is up to 29%.  This gene is also associated with a possible elevated risk for colorectal cancer.  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 EGD and colonoscopy in March was negative. She underwent bilateral mastectomies with reconstruction and is healing well.   Depression She continues to suffer with depression and anxiety. She states this has increased significantly since surgery as she is processing her "new normal". She has a supportive husband who is present today and states that he has noticed her depression increasing. She is currently on buspar and weaning clonazepam. She was on Effexor in the past for hot flashes and depression, but did not like how this made her feel, so she stopped taking it. She continues with hot flashes and would like to start another medication in hopes of treating the hot flashes as well as depression. She has used zoloft in the past and this as well made her feel "zombie like". We will try celexa 20 mg daily.    The patient understands the plans discussed today and is in agreement with them.  She knows to contact our office if she develops concerns prior to her next  appointment.   Melodye Ped, NP  [facility] [address] [place] [address] Dept: [phone] Dept Fax: [phone]   No orders of the defined types were placed in this encounter.     CHIEF COMPLAINT:  CC: A 45 year old female with history of breast cancer here for 3 month evaluation.   Current Treatment:  Tamoxifen  INTERVAL HISTORY:  Brianna Merritt is here today for repeat clinical assessment. She denies fevers or chills. She denies pain. Her appetite is good. Her weight has been stable.  I have reviewed the past medical history, past surgical history, social history and family history with the patient and they are unchanged from previous note.  ALLERGIES:  is allergic to ketorolac, ketorolac tromethamine, sumatriptan, meperidine hcl, meperidine, and triptans.  MEDICATIONS:  Current Outpatient Medications  Medication Sig Dispense Refill   ALPRAZolam (XANAX) 0.5 MG tablet Take 1 tablet (0.5 mg total) by mouth 2 (two) times daily as needed for anxiety. 60 tablet 0   citalopram (CELEXA) 20 MG tablet Take 1 tablet (20 mg total) by mouth daily. 30 tablet 0   albuterol (PROVENTIL HFA;VENTOLIN HFA) 108 (90 BASE) MCG/ACT inhaler Inhale 2 puffs into the lungs every 6 (six) hours as needed for wheezing or shortness of breath.     busPIRone (BUSPAR) 5 MG tablet Take by mouth.     clonazePAM (KLONOPIN) 0.5 MG tablet Take 0.5 mg by mouth 3 (three) times daily as needed.     diphenhydramine-acetaminophen (TYLENOL PM) 25-500 MG TABS tablet Take 1 tablet by  mouth at bedtime as needed.     hydrOXYzine (ATARAX/VISTARIL) 25 MG tablet SMARTSIG:1-1.5 Tablet(s) By Mouth 3 Times Daily PRN     linaclotide (LINZESS) 72 MCG capsule Take 1 capsule (72 mcg total) by mouth daily before breakfast. 30 capsule 11   omeprazole (PRILOSEC) 20 MG capsule Take 1 capsule (20 mg total) by mouth daily. 90 capsule 3   ondansetron (ZOFRAN) 4 MG  tablet Take 1 tablet (4 mg total) by mouth every 8 (eight) hours as needed for nausea or vomiting. 20 tablet 0   tamoxifen (NOLVADEX) 20 MG tablet Take 1 tablet by mouth once daily (Patient taking differently: Stopped it for surgery and has not started it back) 90 tablet 3   Current Facility-Administered Medications  Medication Dose Route Frequency Provider Last Rate Last Admin   0.9 %  sodium chloride infusion  500 mL Intravenous Once Jackquline Denmark, MD        HISTORY OF PRESENT ILLNESS:   Oncology History  Malignant neoplasm of upper-outer quadrant of right female breast ([place])  12/06/2019 Initial Diagnosis   Breast cancer, right ([place])   12/06/2019 Cancer Staging   Staging form: Breast, AJCC 8th Edition - Clinical stage from 12/06/2019: Stage IA (cT1b, cN0(sn), cM0, G2, ER+, PR+, HER2-) - Signed by Derwood Kaplan, MD on 07/07/2020    Ductal carcinoma in situ of left breast  03/31/2021 Cancer Staging   Staging form: Breast, AJCC 8th Edition - Clinical stage from 03/31/2021: Stage 0 (cTis (DCIS), cN0, cM0, G2, ER+, PR+, HER2: Not Assessed) - Signed by Derwood Kaplan, MD on 05/01/2021 Histopathologic type: Intraductal carcinoma, noninfiltrating, NOS Stage prefix: Initial diagnosis Method of lymph node assessment: Clinical Nuclear grade: G2 Multigene prognostic tests performed: None Histologic grading system: 3 grade system Laterality: Left Tumor size (mm): 3 Lymph-vascular invasion (LVI): LVI not present (absent)/not identified Diagnostic confirmation: Positive histology Specimen type: Excision Staged by: Managing physician Menopausal status: Premenopausal Prognostic indicators: Incidental finding at bilat mast for CHEK 2 mutation, prior invasive breast cancer in the contralateral breast Stage used in treatment planning: Yes National guidelines used in treatment planning: Yes Type of national guideline used in treatment planning: NCCN    05/01/2021 Initial Diagnosis   Ductal  carcinoma in situ of left breast       REVIEW OF SYSTEMS:   Constitutional: Denies fevers, chills or abnormal weight loss Eyes: Denies blurriness of vision Ears, nose, mouth, throat, and face: Denies mucositis or sore throat Respiratory: Denies cough, dyspnea or wheezes Cardiovascular: Denies palpitation, chest discomfort or lower extremity swelling Gastrointestinal:  Denies nausea, heartburn or change in bowel habits Skin: Denies abnormal skin rashes Lymphatics: Denies new lymphadenopathy or easy bruising Neurological:Denies numbness, tingling or new weaknesses Behavioral/Psych: Mood is stable, no new changes  All other systems were reviewed with the patient and are negative.   VITALS:  Blood pressure 100/60, pulse 76, temperature 97.8 F (36.6 C), temperature source Oral, resp. rate 18, height 5' (1.524 m), weight 140 lb 1.6 oz (63.5 kg), SpO2 100 %.  Wt Readings from Last 3 Encounters:  07/28/21 140 lb 1.6 oz (63.5 kg)  07/01/21 136 lb 14.5 oz (62.1 kg)  06/09/21 136 lb 3.2 oz (61.8 kg)    Body mass index is 27.36 kg/m.  Performance status (ECOG): 1 - Symptomatic but completely ambulatory  PHYSICAL EXAM:   GENERAL:alert, no distress and comfortable SKIN: skin color, texture, turgor are normal, no rashes or significant lesions EYES: normal, Conjunctiva are pink and non-injected, sclera  clear OROPHARYNX:no exudate, no erythema and lips, buccal mucosa, and tongue normal  NECK: supple, thyroid normal size, non-tender, without nodularity LYMPH:  no palpable lymphadenopathy in the cervical, axillary or inguinal LUNGS: clear to auscultation and percussion with normal breathing effort HEART: regular rate & rhythm and no murmurs and no lower extremity edema ABDOMEN:abdomen soft, non-tender and normal bowel sounds Musculoskeletal:no cyanosis of digits and no clubbing  NEURO: alert & oriented x 3 with fluent speech, no focal motor/sensory deficits  LABORATORY DATA:  I have  reviewed the data as listed    Component Value Date/Time   NA 137 07/01/2021 1013   NA 139 04/28/2021 0000   K 3.8 07/01/2021 1013   CL 103 07/01/2021 1013   CO2 25 07/01/2021 1013   GLUCOSE 101 (H) 07/01/2021 1013   BUN 10 07/01/2021 1013   BUN 13 04/28/2021 0000   CREATININE 0.71 07/01/2021 1013   CALCIUM 8.8 (L) 07/01/2021 1013   PROT 7.4 07/04/2020 1122   ALBUMIN 4.1 04/28/2021 0000   AST 29 04/28/2021 0000   ALT 15 04/28/2021 0000   ALKPHOS 49 04/28/2021 0000   BILITOT 0.3 07/04/2020 1122   GFRNONAA >60 07/01/2021 1013   GFRAA >90 06/08/2014 1418    No results found for: SPEP, UPEP  Lab Results  Component Value Date   WBC 5.2 04/28/2021   NEUTROABS 2.86 04/28/2021   HGB 13.1 04/28/2021   HCT 39 04/28/2021   MCV 97.8 07/04/2020   PLT 244 04/28/2021      Chemistry      Component Value Date/Time   NA 137 07/01/2021 1013   NA 139 04/28/2021 0000   K 3.8 07/01/2021 1013   CL 103 07/01/2021 1013   CO2 25 07/01/2021 1013   BUN 10 07/01/2021 1013   BUN 13 04/28/2021 0000   CREATININE 0.71 07/01/2021 1013   GLU 83 04/28/2021 0000      Component Value Date/Time   CALCIUM 8.8 (L) 07/01/2021 1013   ALKPHOS 49 04/28/2021 0000   AST 29 04/28/2021 0000   ALT 15 04/28/2021 0000   BILITOT 0.3 07/04/2020 1122       RADIOGRAPHIC STUDIES: I have personally reviewed the radiological images as listed and agreed with the findings in the report. No results found.

## 2021-07-28 ENCOUNTER — Telehealth: Payer: Self-pay

## 2021-07-28 ENCOUNTER — Encounter: Payer: Self-pay | Admitting: Hematology and Oncology

## 2021-07-28 ENCOUNTER — Inpatient Hospital Stay: Payer: Medicare Other | Attending: Hematology and Oncology | Admitting: Hematology and Oncology

## 2021-07-28 DIAGNOSIS — Z1502 Genetic susceptibility to malignant neoplasm of ovary: Secondary | ICD-10-CM

## 2021-07-28 DIAGNOSIS — F3289 Other specified depressive episodes: Secondary | ICD-10-CM

## 2021-07-28 DIAGNOSIS — Z1509 Genetic susceptibility to other malignant neoplasm: Secondary | ICD-10-CM

## 2021-07-28 DIAGNOSIS — C50919 Malignant neoplasm of unspecified site of unspecified female breast: Secondary | ICD-10-CM

## 2021-07-28 DIAGNOSIS — C50411 Malignant neoplasm of upper-outer quadrant of right female breast: Secondary | ICD-10-CM | POA: Diagnosis not present

## 2021-07-28 DIAGNOSIS — Z1589 Genetic susceptibility to other disease: Secondary | ICD-10-CM

## 2021-07-28 DIAGNOSIS — Z170 Estrogen receptor positive status [ER+]: Secondary | ICD-10-CM | POA: Diagnosis not present

## 2021-07-28 MED ORDER — ALPRAZOLAM 0.5 MG PO TABS: 0.5000 mg | ORAL_TABLET | Freq: Two times a day (BID) | ORAL | 0 refills | Status: DC | PRN

## 2021-07-28 MED ORDER — CITALOPRAM HYDROBROMIDE 20 MG PO TABS: 20.0000 mg | ORAL_TABLET | Freq: Every day | ORAL | 0 refills | Status: DC

## 2021-07-28 NOTE — Assessment & Plan Note (Signed)
Stage IA (T1b N0 M0) hormone receptor positive breast cancer, treated with lumpectomy and adjuvant radiation.  She continues adjuvant hormonal therapy with tamoxifen daily and is tolerating this well, except for fatigue.

## 2021-07-28 NOTE — Telephone Encounter (Signed)
Per Almira Bar NP.  Hold Xanax prescription for now since pt has just received refill on 07/14/2021.

## 2021-07-28 NOTE — Assessment & Plan Note (Signed)
CHEK2 heterozygous gene mutation.  This is a high risk gene related to an increased risk of breast cancer with her cancer risk being 20-31% when compared to the risk for the general population of 10.2%.  Her risk for developing a secondary primary within 10 years is up to 29%.  This gene is also associated with a possible elevated risk for colorectal cancer.  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 EGD and colonoscopy in March was negative. She underwent bilateral mastectomies with reconstruction and is healing well.

## 2021-07-28 NOTE — Assessment & Plan Note (Addendum)
She continues to suffer with depression and anxiety. She states this has increased significantly since surgery as she is processing her "new normal". She has a supportive husband who is present today and states that he has noticed her depression increasing. She is currently on buspar and weaning clonazepam. She was on Effexor in the past for hot flashes and depression, but did not like how this made her feel, so she stopped taking it. She continues with hot flashes and would like to start another medication in hopes of treating the hot flashes as well as depression. She has used zoloft in the past and this as well made her feel "zombie like". We will try celexa 20 mg daily.

## 2021-08-10 ENCOUNTER — Telehealth: Payer: Self-pay | Admitting: Hematology and Oncology

## 2021-08-10 NOTE — Progress Notes (Deleted)
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 Marla Roe.    She was seen most recently on 07/21/2021.  At that time, she complained of mild pressure, but no infectious symptoms or obvious wounds/drainage.  Physical exam was largely reassuring.  Honeycomb was removed without complication.  Left breast was slightly more ptotic, but she has history of right-sided radiation.  Recommended that she massage her right breast inferiorly and continue with ibuprofen as needed for any chest discomfort.  ***Pictures***

## 2021-08-10 NOTE — Telephone Encounter (Signed)
Per 12/6 LOS, patient scheduled for March 2023 Appt.  Gave patient Appt Summary

## 2021-08-14 ENCOUNTER — Ambulatory Visit: Payer: Medicare Other | Admitting: Physician Assistant

## 2021-08-18 ENCOUNTER — Other Ambulatory Visit: Payer: Self-pay

## 2021-08-18 ENCOUNTER — Ambulatory Visit (INDEPENDENT_AMBULATORY_CARE_PROVIDER_SITE_OTHER): Payer: Medicare Other | Admitting: Physician Assistant

## 2021-08-18 DIAGNOSIS — Z9889 Other specified postprocedural states: Secondary | ICD-10-CM

## 2021-08-18 NOTE — Progress Notes (Signed)
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 Marla Roe.    Patient was last seen here in clinic on 07/21/2021.  At that time, she endorsed a fullness "pressure" in each breast to which she reported that she was still getting accustomed.  Denies any pain symptoms.  Physical exam was entirely reassuring.  Mild asymmetry with left nipple more downward pointing.  See her preoperative photo 01/20/2021 which shows similar preoperative asymmetry.  History of right-sided radiation.  Recommended that she massage her right breast inferiorly.   Today, patient is doing well.  She does complain of animation when engaging her pectoralis muscles, but otherwise is pleased with the outcome.  She also felt as though there is a residual suture at medial aspect of her incision left breast.  She inquires about restrictions.  Denies fevers or redness.  Physical exam is entirely reassuring.  There was an area of scabbing over medial aspect incision, left breast.  Suspect possible suture, but there is no suture visible for removal.  Recommending Vaseline gauze over that area.  Breasts have good shape, no erythema.  Mild persistent asymmetry with left downward pointing nipple compared to right upward pointing nipple.  As mentioned previously, similar to her preoperative asymmetry and she has been radiated on the right side.  Discussed with patient that she can return to work and physical activity.  She does not need to wear a specific compressive bra.  Advised to avoid underwire bras.  I also encouraged her to avoid submerging her breasts and water for a couple more months.  Given that this is a new scar, recommend that they apply Mederma once daily at night x8 weeks.  She is cleared from a postoperative recovery standpoint, no specific follow-up needed.  Picture(s) obtained of the patient and  placed in the chart were with the patient's or guardian's permission.

## 2021-08-18 NOTE — Addendum Note (Signed)
Addended by: Lindon Romp on: 08/18/2021 03:24 PM   Modules accepted: Orders

## 2021-10-19 ENCOUNTER — Telehealth: Payer: Self-pay | Admitting: *Deleted

## 2021-10-19 NOTE — Telephone Encounter (Signed)
Received on (10/09/2021) via of fax DME Standard Written Order from [facility].  Requesting signature and return.  Given to provider to sign and return.  DME Standard Written Order signed and faxed back to [facility].  Confirmation received and copy scanned into the chart.//AB/CMA

## 2021-10-19 NOTE — Progress Notes (Signed)
Brianna Merritt  [address] [place],  [place]  [address] [phone]  Clinic Day: 10/23/2021  Referring physician: Jeanie Sewer, NP  This document serves as a record of services personally performed by Hosie Poisson, MD. It was created on their behalf by Curry,Lauren E, a trained medical scribe. The creation of this record is based on the scribe's personal observations and the provider's statements to them.  CHIEF COMPLAINT:  CC:  Stage I A hormone sensitive breast cancer  Current Treatment:   Tamoxifen 20 mg daily   HISTORY OF PRESENT ILLNESS:  Brianna Merritt is a 46 y.o. 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  Annual mammography in March revealed possible asymmetries within the bilateral breasts. Bilateral diagnostic mammogram revealed a heterogeneous hypoechoic irregular mass with microlobulated and angular margins at the 10 oclock position 6 cm from the nipple measuring 7 x 9 x 6 mm in the right breast.  Ultrasound of the right axilla was negative.  No evidence of malignancy was seen in the left breast.  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  The invasive carcinoma involved all cores measuring 8 mm in maximal extent.  Estrogen receptor was 95% positive and progesterone receptor was 100% positive.  HER2 was equivocal 2+, but HER2 by FISH was negative.    Due to her personal and family history of breast cancer she underwent genetic testing with the [employer] myRisk Hereditary Cancer Panel.  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  This particular mutation is associated with an increased lifetime risk of breast cancer of 20 to 31%, with the risk of a 2nd breast cancer primary within  10 years of her 1st breast cancer up to 29%.  There is insufficient evidence to recommend prophylactic mastectomy with a CHEK2 mutation. There is also possibly an elevated risk of colorectal cancer associated with a CHEK2 mutation, which cannot be quantified. There was also a variant of uncertain significance (VUS) found in the MSH2.  She was treated with lumpectomy and right axillary sentinel lymph node biopsy in April 2021.  Final pathology revealed an 8 mm, grade 2,  invasive ductal carcinoma with ductal carcinoma in situ, intermediate nuclear grade.  Margins were free of neoplasm.  One sentinel lymph node was negative for metastatic carcinoma (0/1).  She was premenopausal, and had irregular menses, so was taking Lo Loestrin 1 mg/10 mcg for about 5 months prior to her diagnosis.  This was discontinued following her diagnosis.  EndoPredict revealed a 12 gene molecular score of 7.2 with an EPclin score of 3.0, which is considered low risk.  Her risk for distant recurrence within 10 years is 7.4% with adjuvant radiation and endocrine therapy alone, with an absolute chemotherapy benefit of 1.9%.  Her risk for distant recurrence in 5-15 years is 5.8%.  Adjuvant chemotherapy was not recommended.  She completed adjuvant radiation in July. She was placed on adjuvant hormonal therapy with tamoxifen 20 mg daily in September. She was on duloxetine at that time and due to the interaction with tamoxifen, she was switched to venlafaxine, but later stopped it. Her last colonoscopy and EGD were in 2004, at which time colonoscopy was unremarkable. She had GERD with a hiatal hernia, mild distal esophagitis and healed  gastric ulcer.  We recommended repeat colonoscopy due to the CHEK2 mutation. EGD and colonoscopy did not reveal any significant abnormality.  She did have removal of 2 polyps, 1 of which was a villous adenoma, and the other a hyperplastic polyp.    She had an abnormal mammogram that revealed calcifications within  the bilateral breasts. On MRI breast these areas were suspicious.  There was no corresponding mass on ultrasound.   MRI guided biopsy of the right breast upper inner quadrant and left breast upper inner quadrant revealed fibroadenomatoid changes and fibrocystic with usual ductal hyperplasia, fibroadenomatoid changes respectively.  Stereotactic biopsy of 2 areas the right lower outer quadrant was also recommended.  Pathology of the posterior depth revealed diminutive complex sclerosing lesion with calcifications. Pathology of the anterior depth revealed benign glands with calcifications. She was scheduled for a stereotactic biopsy of 2 areas of the left breast upper outer quadrant on May 16th, but she saw Dr. Lilia Pro.  Due to the need for multiple biopsies and increased risk for breast cancer with her CHEK2 mutation, they decided on bilateral mastectomies and immediate breast reconstruction and he referred her to Dr. Marla Roe. She underwent  bilateral mastectomies with Dr. Lilia Pro on August 3rd.  This was followed by immediate reconstruction with Dr. Marla Roe.  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  Margins uninvolved by carcinoma.  Complex sclerosing lesion, fibrocystic changes with apocrine metaplasia and adenosis and calcifications.  Right breast revealed fibroadenomatoid and fibrocystic changes with calcifications.  Previous surgical site changes.  No malignancy identified.    INTERVAL HISTORY:  Brianna Merritt is here for routine follow up and notes intermittent sharp stabbing pain underneath her implants, right worse than left. This pain ranges from a 3-6/10. This is not relieved with pain medication but is relieved with muscle relaxer. She still has not regained feeling of the right reconstruction. She continues tamoxifen daily without significant difficulty. She does report some hair thinning. She is scheduled for a sleep study in April  to evaluate for sleep apnea. She undergoes routine lab work at her primary care office. Her  appetite is fair, and she has gained 2 pounds since her last visit.  She denies fever, chills or other signs of infection.  She denies nausea, vomiting, bowel issues, or abdominal pain.  She denies sore throat, cough, dyspnea, or chest pain.  REVIEW OF SYSTEMS:  Review of Systems  Constitutional: Negative.  Negative for appetite change, chills, fatigue, fever and unexpected weight change.  HENT:  Negative.    Eyes: Negative.   Respiratory: Negative.  Negative for chest tightness, cough, hemoptysis, shortness of breath and wheezing.   Cardiovascular: Negative.  Negative for chest pain, leg swelling and palpitations.  Gastrointestinal: Negative.  Negative for abdominal distention, abdominal pain, blood in stool, constipation, diarrhea, nausea and vomiting.  Endocrine: Negative.   Genitourinary: Negative.  Negative for difficulty urinating, dysuria, frequency and hematuria.   Musculoskeletal:  Negative for arthralgias, back pain, flank pain, gait problem and myalgias.       Intermittent sharp pain underneath her implants, right worse than left. Numbness of the right reconstruction.  Skin: Negative.   Neurological: Negative.  Negative for dizziness, extremity weakness, gait problem, headaches, light-headedness, numbness, seizures and speech difficulty.  Hematological: Negative.   Psychiatric/Behavioral: Negative.  Negative for depression and sleep disturbance. The patient is not nervous/anxious.     VITALS:  Blood pressure 118/66, pulse 95, temperature 98 F (36.7 C),  temperature source Oral, resp. rate 20, SpO2 99 %.  Wt Readings from Last 3 Encounters:  07/28/21 140 lb 1.6 oz (63.5 kg)  07/01/21 136 lb 14.5 oz (62.1 kg)  06/09/21 136 lb 3.2 oz (61.8 kg)    There is no height or weight on file to calculate BMI.  Performance status (ECOG): 1 - Symptomatic but completely ambulatory  PHYSICAL EXAM:   Physical Exam Constitutional:      General: She is not in acute distress.    Appearance: Normal appearance. She is normal weight.  HENT:     Head: Normocephalic and atraumatic.  Eyes:     General: No scleral icterus.    Extraocular Movements: Extraocular movements intact.     Conjunctiva/sclera: Conjunctivae normal.     Pupils: Pupils are equal, round, and reactive to light.  Cardiovascular:     Rate and Rhythm: Normal rate and regular rhythm.     Pulses: Normal pulses.     Heart sounds: Normal heart sounds. No murmur heard.   No friction rub. No gallop.  Pulmonary:     Effort: Pulmonary effort is normal. No respiratory distress.     Breath sounds: Normal breath sounds.  Abdominal:     General: Bowel sounds are normal. There is no distension.     Palpations: Abdomen is soft. There is no hepatomegaly, splenomegaly or mass.     Tenderness: There is no abdominal tenderness.  Musculoskeletal:        General: Normal range of motion.     Cervical back: Normal range of motion and neck supple.     Right lower leg: No edema.     Left lower leg: No edema.  Lymphadenopathy:     Cervical: No cervical adenopathy.  Skin:    General: Skin is warm and dry.  Neurological:     General: No focal deficit present.     Mental Status: She is alert and oriented to person, place, and time. Mental status is at baseline.  Psychiatric:        Mood and Affect: Mood normal.        Behavior: Behavior normal.        Thought Content: Thought content normal.        Judgment: Judgment normal.  Bilateral recon negative LABS:   CBC Latest Ref Rng & Units 04/28/2021 10/03/2020 07/04/2020  WBC - 5.2 4.2 6.4  Hemoglobin 12.0 - 16.0 13.1 13.3 13.5  Hematocrit 36 - 46 39 39 40.6  Platelets 150 - 399 244 213 267   CMP Latest Ref Rng & Units 07/01/2021 04/28/2021 10/03/2020  Glucose 70 - 99 mg/dL 101(H) - -  BUN 6 - 20 mg/dL 10 13 10   Creatinine 0.44 - 1.00 mg/dL 0.71 0.8 0.8  Sodium 135 - 145 mmol/L 137 139 139   Potassium 3.5 - 5.1 mmol/L 3.8 3.7 4.3  Chloride 98 - 111 mmol/L 103 103 108  CO2 22 - 32 mmol/L 25 25(A) 26(A)  Calcium 8.9 - 10.3 mg/dL 8.8(L) 8.8 9.0  Total Protein 6.5 - 8.1 g/dL - - -  Total Bilirubin 0.3 - 1.2 mg/dL - - -  Alkaline Phos 25 - 125 - 49 43  AST 13 - 35 - 29 18  ALT 7 - 35 - 15 19    STUDIES:  No results found.    HISTORY:   Allergies:  Allergies  Allergen Reactions   Ketorolac Itching   Ketorolac Tromethamine Itching   Sumatriptan Other (See Comments) and  Anaphylaxis    Stroke risk   Meperidine Hcl     Combative   Bactrim [Sulfamethoxazole-Trimethoprim] Hives   Meperidine Other (See Comments)    combative Other reaction(s): Other (See Comments) combative   Triptans Other (See Comments)    Hemiplegic migraine    Current Medications: Current Outpatient Medications  Medication Sig Dispense Refill   albuterol (PROVENTIL HFA;VENTOLIN HFA) 108 (90 BASE) MCG/ACT inhaler Inhale 2 puffs into the lungs every 6 (six) hours as needed for wheezing or shortness of breath.     amitriptyline (ELAVIL) 25 MG tablet Take 25 mg by mouth at bedtime.     citalopram (CELEXA) 20 MG tablet Take 1 tablet (20 mg total) by mouth daily. 30 tablet 0   clonazePAM (KLONOPIN) 0.5 MG tablet Take 0.5 mg by mouth 3 (three) times daily as needed.     diphenhydramine-acetaminophen (TYLENOL PM) 25-500 MG TABS tablet Take 1 tablet by mouth at bedtime as needed.     linaclotide (LINZESS) 72 MCG capsule Take 1 capsule (72 mcg total) by mouth daily before breakfast. 30 capsule 11   methocarbamol (ROBAXIN) 500 MG tablet Take 1 tablet (500 mg total) by mouth every 6 (six) hours as needed for muscle spasms. 30 tablet 5   omeprazole (PRILOSEC) 20 MG capsule Take 1 capsule (20 mg total) by mouth daily. 90 capsule 3   ondansetron (ZOFRAN) 4 MG tablet Take 1 tablet (4 mg total) by mouth every 8 (eight) hours as needed for nausea or vomiting. 20 tablet 0   tamoxifen (NOLVADEX) 20 MG tablet Take 1  tablet by mouth once daily (Patient taking differently: Stopped it for surgery and has not started it back) 90 tablet 3   TRULANCE 3 MG TABS Take 1 tablet by mouth daily.     Current Facility-Administered Medications  Medication Dose Route Frequency Provider Last Rate Last Admin   0.9 %  sodium chloride infusion  500 mL Intravenous Once Jackquline Denmark, MD         ASSESSMENT & PLAN:   Assessment:  1.  Stage IA (T1b N0 M0) hormone receptor positive breast cancer, treated with lumpectomy and adjuvant radiation.  She continues adjuvant hormonal therapy with tamoxifen daily and is tolerating this well, except for fatigue. She remains without evidence of recurrence.  2.  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  She has undergone bilateral mastectomies with Dr. Lilia Pro, and underwent reconstruction with Dr. Marla Roe.  3.  CHEK2 heterozygous gene mutation.  This is a high risk gene related to an increased risk of breast cancer with her cancer risk being 20-31% when compared to the risk for the general population of 10.2%.  Her risk for developing a secondary primary within 10 years is up to 29%.  This gene is also associated with a possible elevated risk for colorectal cancer.  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 EGD and colonoscopy in March 2022 was negative.  4.  Seizure disorder; she has been without an episode in about 4 years.  5.  Tobacco abuse.  I discussed the importance of the cessation of smoking.   6.  Depression/anxiety, she is not taking venlafaxine or duloxetine.  She continues clonazepam and buspirone.  7.  Intermittent sharp pain of the bilateral reconstructions, right worse than left. This is not relieved with pain medication, but is improved with muscle relaxer. The nature of her pain does seem more consistent with muscle  spasm/cramps rather than neuropathic pain. I will send in Robaxin 500 mg  to use as needed.  Plan:      She knows to continue tamoxifen daily. As above, I will send in Robaxin to use as needed for her pain of the bilateral reconstructions. Otherwise, we will plan to see her back in 4 months for re-examination.  The patient understands the plans discussed today and is in agreement with them.  She knows to contact our office if she develops concerns prior to her next appointment.   I, Rita Ohara, am acting as scribe for Derwood Kaplan, MD  I have reviewed this report as typed by the medical scribe, and it is complete and accurate.

## 2021-10-21 NOTE — Telephone Encounter (Signed)
Next appt scheduled ?

## 2021-10-23 ENCOUNTER — Other Ambulatory Visit: Payer: Self-pay

## 2021-10-23 ENCOUNTER — Inpatient Hospital Stay: Payer: Medicare Other | Attending: Hematology and Oncology | Admitting: Oncology

## 2021-10-23 ENCOUNTER — Other Ambulatory Visit: Payer: Self-pay | Admitting: Oncology

## 2021-10-23 VITALS — BP 118/66 | HR 95 | Temp 98.0°F | Resp 20

## 2021-10-23 DIAGNOSIS — Z170 Estrogen receptor positive status [ER+]: Secondary | ICD-10-CM

## 2021-10-23 DIAGNOSIS — Z1589 Genetic susceptibility to other disease: Secondary | ICD-10-CM

## 2021-10-23 DIAGNOSIS — Z1509 Genetic susceptibility to other malignant neoplasm: Secondary | ICD-10-CM

## 2021-10-23 DIAGNOSIS — C50411 Malignant neoplasm of upper-outer quadrant of right female breast: Secondary | ICD-10-CM | POA: Diagnosis not present

## 2021-10-23 DIAGNOSIS — Z1502 Genetic susceptibility to malignant neoplasm of ovary: Secondary | ICD-10-CM

## 2021-10-23 DIAGNOSIS — C50919 Malignant neoplasm of unspecified site of unspecified female breast: Secondary | ICD-10-CM

## 2021-10-23 MED ORDER — METHOCARBAMOL 500 MG PO TABS: 500.0000 mg | ORAL_TABLET | Freq: Four times a day (QID) | ORAL | 5 refills | Status: DC | PRN

## 2021-10-30 ENCOUNTER — Encounter: Payer: Self-pay | Admitting: Oncology

## 2021-11-04 ENCOUNTER — Other Ambulatory Visit: Payer: Self-pay | Admitting: Gastroenterology

## 2022-02-02 ENCOUNTER — Other Ambulatory Visit: Payer: Self-pay | Admitting: Gastroenterology

## 2022-02-25 ENCOUNTER — Other Ambulatory Visit: Payer: Self-pay

## 2022-02-25 NOTE — Progress Notes (Signed)
[facility]  [address] [place],  [place]  [address] [phone]  Clinic Day:  03/01/2022  Referring physician: Brantley Fling Me*  ASSESSMENT & PLAN:   Assessment & Plan: Malignant neoplasm of upper-outer quadrant of right female breast ([place]) Stage IA (T1b N0 M0) hormone receptor positive breast cancer diagnosed in April 2021.  She was treated with lumpectomy and adjuvant radiation.  She was placed on adjuvant hormonal therapy with tamoxifen 20 mg daily in August 2021.  She continues to tolerate this well, except for fatigue. She remains without evidence of recurrence.  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  She has undergone bilateral mastectomies with Dr. Lilia Pro, and underwent reconstruction with Dr. Marla Roe.  She remains without evidence of recurrence.  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  This is associated with a high risk of breast cancer with her cancer risk being 20-31% when compared to the risk for the general population of 10.2%.  Her risk for developing a secondary primary within 10 years is up to 29%, so additional screening MRI breast was recommended.  This gene is also associated with a possible elevated risk for colorectal cancer, so screening colonoscopy every 5 years instead of 10 years was recommended.  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    EGD and colonoscopy in March 2022 revealed reactive gastropathy, as well as removal of a villous adenoma and hyperplastic polyp.  She is not sure when she is to be scheduled for her next colonoscopy.    She has undergone bilateral mastectomies with reconstruction in August 2022.   Tobacco abuse She quit smoking in April 2022.  I reinforced the importance of continued abstinence from  tobacco.  She previously smoked up to 1.5 packs/day for 33 years, so has up to 49-year pack history of smoking.  She will be eligible for low-dose CT chest for lung cancer screening at age 15.    The patient understands the plans discussed today and is in agreement with them.  She knows to contact our office if she develops concerns prior to her next appointment.   I provided 30 minutes of face-to-face time during this encounter and > 50% was spent counseling as documented under my assessment and plan.    Marvia Pickles, PA-C  [facility] AT [facility] [address] [place] [address] Dept: [phone] Dept Fax: [phone]   No orders of the defined types were placed in this encounter.     CHIEF COMPLAINT:  CC: Stage IA hormone receptor positive breast cancer  Current Treatment: Adjuvant tamoxifen 20 mg daily  HISTORY OF PRESENT ILLNESS:  Brianna Merritt is a 46 y.o. 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  Annual mammography in March revealed possible asymmetries within the bilateral breasts. Bilateral diagnostic mammogram revealed a heterogeneous hypoechoic irregular mass with microlobulated and angular margins at the 10 o'clock position 6 cm from the nipple measuring 7 x 9 x 6 mm in the right breast.  Ultrasound of the right axilla was negative.  No evidence of malignancy was seen in the left breast.  Ultrasound guided biopsy of the right breast mass in April revealed a grade 2, invasive  ductal carcinoma in the background of DCIS with necrosis.  The invasive carcinoma involved all cores measuring 8 mm in maximal extent.  Estrogen receptor was 95% positive and progesterone receptor was 100% positive.  HER2 was equivocal 2+, but HER2 by FISH was negative.     Due to her personal and family history of breast cancer she underwent genetic  testing with the [employer] myRisk Hereditary Cancer Panel.  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  This particular mutation is associated with an increased lifetime risk of breast cancer of 20 to 31%, with the risk of a 2nd breast cancer primary within 10 years of her 1st breast cancer up to 29%.  There is insufficient evidence to recommend prophylactic mastectomy with a CHEK2 mutation. There is also possibly an elevated risk of colorectal cancer associated with a CHEK2 mutation, which cannot be quantified. There was also a variant of uncertain significance (VUS) found in the MSH2.   She was treated with lumpectomy and right axillary sentinel lymph node biopsy in April 2021.  Final pathology revealed an 8 mm, grade 2,  invasive ductal carcinoma with ductal carcinoma in situ, intermediate nuclear grade.  Margins were free of neoplasm.  One sentinel lymph node was negative for metastatic carcinoma (0/1).  She was premenopausal, and had irregular menses, so was taking Lo Loestrin 1 mg/10 mcg for about 5 months prior to her diagnosis.  This was discontinued following her diagnosis.  EndoPredict revealed a 12 gene molecular score of 7.2 with an EPclin score of 3.0, which is considered low risk.  Her risk for distant recurrence within 10 years is 7.4% with adjuvant radiation and endocrine therapy alone, with an absolute chemotherapy benefit of 1.9%.  Her risk for distant recurrence in 5-15 years is 5.8%.  Adjuvant chemotherapy was not recommended.  She completed adjuvant radiation in July. She was placed on adjuvant hormonal therapy with tamoxifen 20 mg daily in September 2021. She was on duloxetine at that time and due to the interaction with tamoxifen, she was switched to venlafaxine, but later stopped it. Her last colonoscopy and EGD were in 2004, at which time colonoscopy was unremarkable. She had GERD with a hiatal hernia, mild distal esophagitis and  healed gastric ulcer.  We recommended repeat colonoscopy due to the CHEK2 mutation. EGD and colonoscopy did not reveal any significant abnormality.  She did have removal of 2 polyps, 1 of which was a villous adenoma, and the other a hyperplastic polyp.     She had an abnormal mammogram in March 2022 that revealed calcifications within the bilateral breasts. There was no corresponding mass on ultrasound.   On MRI breast, these areas were suspicious.  MRI guided biopsy of the right breast upper inner quadrant and left breast upper inner quadrant revealed fibroadenomatoid changes and fibrocystic with usual ductal hyperplasia, fibroadenomatoid changes respectively.  Stereotactic biopsy of 2 areas the right lower outer quadrant was also recommended.  Pathology of the posterior depth revealed diminutive complex sclerosing lesion with calcifications. Pathology of the anterior depth revealed benign glands with calcifications. She was scheduled for a stereotactic biopsy of 2 areas of the left breast upper outer quadrant in May, but she saw Dr. Lilia Pro.  Due to the need for multiple biopsies and increased risk for breast cancer with her CHEK2 mutation, they decided on bilateral mastectomies and immediate breast reconstruction and he referred her to Dr. Marla Roe. She underwent bilateral mastectomies and immediate reconstruction with Dr. Lilia Pro and  Dr. Marla Roe in August.   Final pathology revealed ductal carcinoma in situ, intermediate grade, 0.3 cm of the left (contralateral) breast.  Margins uninvolved by carcinoma.  Complex sclerosing lesion, fibrocystic changes with apocrine metaplasia and adenosis and calcifications.  Right breast revealed fibroadenomatoid and fibrocystic changes with calcifications. Previous surgical site changes.  No malignancy identified.  Tamoxifen 20 mg daily was continued.   Oncology History  Malignant neoplasm of upper-outer quadrant of right female breast ([place])  12/06/2019 Initial  Diagnosis   Breast cancer, right ([place])   12/06/2019 Cancer Staging   Staging form: Breast, AJCC 8th Edition - Clinical stage from 12/06/2019: Stage IA (cT1b, cN0(sn), cM0, G2, ER+, PR+, HER2-) - Signed by Derwood Kaplan, MD on 07/07/2020   Ductal carcinoma in situ of left breast  03/31/2021 Cancer Staging   Staging form: Breast, AJCC 8th Edition - Clinical stage from 03/31/2021: Stage 0 (cTis (DCIS), cN0, cM0, G2, ER+, PR+, HER2: Not Assessed) - Signed by Derwood Kaplan, MD on 05/01/2021 Histopathologic type: Intraductal carcinoma, noninfiltrating, NOS Stage prefix: Initial diagnosis Method of lymph node assessment: Clinical Nuclear grade: G2 Multigene prognostic tests performed: None Histologic grading system: 3 grade system Laterality: Left Tumor size (mm): 3 Lymph-vascular invasion (LVI): LVI not present (absent)/not identified Diagnostic confirmation: Positive histology Specimen type: Excision Staged by: Managing physician Menopausal status: Premenopausal Prognostic indicators: Incidental finding at bilat mast for CHEK 2 mutation, prior invasive breast cancer in the contralateral breast Stage used in treatment planning: Yes National guidelines used in treatment planning: Yes Type of national guideline used in treatment planning: NCCN   05/01/2021 Initial Diagnosis   Ductal carcinoma in situ of left breast       INTERVAL HISTORY:  [place] is here today for repeat clinical assessment and states she continues tamoxifen daily without significant difficulty.  She continues to report intermittent pain in the right chest wall/axilla, which feels like a muscle spasm.  Dr. Hinton Rao gave her methocarbamol to use as needed, which is effective.  She denies any changes in her reconstructions.  She denies new symptoms.  She denies fevers or chills. She reports mild pelvic pain.  She states she has been having frequent menses every 2 to 3 weeks for 3 to 5 days.  She is scheduled to see Dr.  Modesta Messing tomorrow.  Her appetite is good. Her weight has been stable.  She is struggling with survivor issues, adjusting to her reconstructions and intermittent pain.  I will ask our new licensed clinical social worker to meet with her in the near future.  REVIEW OF SYSTEMS:  Review of Systems  Constitutional:  Negative for appetite change, chills, fatigue, fever and unexpected weight change.  HENT:   Negative for lump/mass, mouth sores and sore throat.   Respiratory:  Negative for cough and shortness of breath.   Cardiovascular:  Negative for chest pain and leg swelling.  Gastrointestinal:  Negative for abdominal pain, constipation, diarrhea, nausea and vomiting.  Endocrine: Negative for hot flashes.  Genitourinary:  Positive for menstrual problem and pelvic pain. Negative for difficulty urinating, dysuria, frequency and hematuria.   Musculoskeletal:  Negative for arthralgias, back pain and myalgias.  Skin:  Negative for rash.  Neurological:  Negative for dizziness and headaches.  Hematological:  Negative for adenopathy. Does not bruise/bleed easily.  Psychiatric/Behavioral:  Positive for depression. Negative for sleep disturbance. The patient is nervous/anxious.      VITALS:  Blood pressure (!) 96/54, pulse 88, temperature 98.2 F (36.8 C), temperature source  Oral, resp. rate 20, height 5' (1.524 m), weight 140 lb 3.2 oz (63.6 kg), SpO2 98 %.  Wt Readings from Last 3 Encounters:  03/01/22 140 lb 3.2 oz (63.6 kg)  07/28/21 140 lb 1.6 oz (63.5 kg)  07/01/21 136 lb 14.5 oz (62.1 kg)    Body mass index is 27.38 kg/m.  Performance status (ECOG): 0 - Asymptomatic  PHYSICAL EXAM:  Physical Exam Vitals and nursing note reviewed.  Constitutional:      General: She is not in acute distress.    Appearance: Normal appearance.  HENT:     Head: Normocephalic and atraumatic.     Mouth/Throat:     Mouth: Mucous membranes are moist.     Pharynx: Oropharynx is clear. No oropharyngeal exudate  or posterior oropharyngeal erythema.  Eyes:     General: No scleral icterus.    Extraocular Movements: Extraocular movements intact.     Conjunctiva/sclera: Conjunctivae normal.     Pupils: Pupils are equal, round, and reactive to light.  Cardiovascular:     Rate and Rhythm: Normal rate and regular rhythm.     Heart sounds: Normal heart sounds. No murmur heard.    No friction rub. No gallop.  Pulmonary:     Effort: Pulmonary effort is normal.     Breath sounds: Normal breath sounds. No wheezing, rhonchi or rales.  Chest:  Breasts:    Right: Absent.     Left: Absent.     Comments: Bilateral reconstructions are negative Abdominal:     General: There is no distension.     Palpations: Abdomen is soft. There is no hepatomegaly, splenomegaly or mass.     Tenderness: There is abdominal tenderness in the right lower quadrant and left lower quadrant.  Musculoskeletal:        General: Normal range of motion.     Cervical back: Normal range of motion and neck supple. No tenderness.     Right lower leg: No edema.     Left lower leg: No edema.  Lymphadenopathy:     Cervical: No cervical adenopathy.     Upper Body:     Right upper body: No supraclavicular or axillary adenopathy.     Left upper body: No supraclavicular or axillary adenopathy.     Lower Body: No right inguinal adenopathy. No left inguinal adenopathy.  Skin:    General: Skin is warm and dry.     Coloration: Skin is not jaundiced.     Findings: No rash.  Neurological:     Mental Status: She is alert and oriented to person, place, and time.     Cranial Nerves: No cranial nerve deficit.  Psychiatric:        Mood and Affect: Mood normal.        Behavior: Behavior normal.        Thought Content: Thought content normal.     LABS:      Latest Ref Rng & Units 04/28/2021   12:00 AM 10/03/2020   12:00 AM 07/04/2020   11:22 AM  CBC  WBC  5.2  4.2     6.4   Hemoglobin 12.0 - 16.0 13.1  13.3     13.5   Hematocrit 36 - 46 39   39     40.6   Platelets 150 - 399 244  213     267      This result is from an external source.      Latest Ref Rng & Units  07/01/2021   10:13 AM 04/28/2021   12:00 AM 10/03/2020   12:00 AM  CMP  Glucose 70 - 99 mg/dL 101     BUN 6 - 20 mg/dL 10  13  10       Creatinine 0.44 - 1.00 mg/dL 0.71  0.8  0.8      Sodium 135 - 145 mmol/L 137  139  139      Potassium 3.5 - 5.1 mmol/L 3.8  3.7  4.3      Chloride 98 - 111 mmol/L 103  103  108      CO2 22 - 32 mmol/L 25  25  26       Calcium 8.9 - 10.3 mg/dL 8.8  8.8  9.0      Alkaline Phos 25 - 125  49  43      AST 13 - 35  29  18      ALT 7 - 35  15  19         This result is from an external source.     No results found for: "CEA1", "CEA" / No results found for: "CEA1", "CEA" No results found for: "PSA1" No results found for: "EBR830" No results found for: "CAN125"  No results found for: "TOTALPROTELP", "ALBUMINELP", "A1GS", "A2GS", "BETS", "BETA2SER", "GAMS", "MSPIKE", "SPEI" No results found for: "TIBC", "FERRITIN", "IRONPCTSAT" No results found for: "LDH"  STUDIES:  No results found.    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   Tobacco abuse 03/02/2022    Past Surgical History:  Procedure Laterality Date   BREAST LUMPECTOMY     BREAST RECONSTRUCTION WITH PLACEMENT OF TISSUE EXPANDER AND FLEX HD (ACELLULAR HYDRATED DERMIS) Bilateral 03/25/2021   Procedure: BILATERAL BREAST RECONSTRUCTION WITH PLACEMENT OF TISSUE EXPANDER AND FLEX HD (ACELLULAR HYDRATED DERMIS);  Surgeon: Wallace Going, DO;  Location: [facility];  Service: Plastics;  Laterality: Bilateral;   CESAREAN SECTION     CHOLECYSTECTOMY     ENDOMETRIAL ABLATION     NIPPLE SPARING MASTECTOMY Bilateral 03/25/2021   Procedure: BILATERAL SIMPLE NIPPLE SPARING MASTECTOMY;   Surgeon: Pollyann Samples, MD;  Location: [facility];  Service: General;  Laterality: Bilateral;   REMOVAL OF BILATERAL TISSUE EXPANDERS WITH PLACEMENT OF BILATERAL BREAST IMPLANTS Bilateral 07/01/2021   Procedure: REMOVAL OF BILATERAL TISSUE EXPANDERS WITH PLACEMENT OF BILATERAL BREAST IMPLANTS;  Surgeon: Wallace Going, DO;  Location: [facility];  Service: Plastics;  Laterality: Bilateral;  2 hours   TUBAL LIGATION      Family History  Problem Relation Age of Onset   Colitis Mother    Colon cancer Paternal Uncle    Diabetes Paternal Grandmother     Social History:  reports that she quit smoking about 15 months ago. Her smoking use included cigarettes. She started smoking about 33 years ago. She has a 49.50 pack-year smoking history. She has quit using smokeless tobacco. She reports that she does not drink alcohol and does not use drugs.The patient is alone today.  Allergies:  Allergies  Allergen Reactions   Ketorolac Itching   Ketorolac Tromethamine Itching   Sumatriptan Other (See Comments) and Anaphylaxis    Stroke risk   Meperidine  Hcl     Combative   Bactrim [Sulfamethoxazole-Trimethoprim] Hives   Meperidine Other (See Comments)    combative Other reaction(s): Other (See Comments) combative   Triptans Other (See Comments)    Hemiplegic migraine    Current Medications: Current Outpatient Medications  Medication Sig Dispense Refill   tamoxifen (NOLVADEX) 20 MG tablet Take 1 tablet by mouth once daily 90 tablet 3   albuterol (PROVENTIL HFA;VENTOLIN HFA) 108 (90 BASE) MCG/ACT inhaler Inhale 2 puffs into the lungs every 6 (six) hours as needed for wheezing or shortness of breath.     amitriptyline (ELAVIL) 25 MG tablet Take 25 mg by mouth at bedtime.     citalopram (CELEXA) 20 MG tablet Take 1 tablet (20 mg total) by mouth daily. 30 tablet 0   clonazePAM (KLONOPIN) 1 MG tablet Take by mouth.     diphenhydramine-acetaminophen (TYLENOL PM)  25-500 MG TABS tablet Take 1 tablet by mouth at bedtime as needed.     fluticasone (FLONASE) 50 MCG/ACT nasal spray Place 1 spray into both nostrils 2 (two) times daily.     levalbuterol (XOPENEX HFA) 45 MCG/ACT inhaler SMARTSIG:1 Puff(s) By Mouth 1-4 Times Daily     methocarbamol (ROBAXIN) 500 MG tablet Take 1 tablet (500 mg total) by mouth every 6 (six) hours as needed for muscle spasms. 30 tablet 5   omeprazole (PRILOSEC) 20 MG capsule Take 1 capsule (20 mg total) by mouth daily. Please call [phone] to schedule an office visit for refills 30 capsule 2   ondansetron (ZOFRAN) 4 MG tablet Take 1 tablet (4 mg total) by mouth every 8 (eight) hours as needed for nausea or vomiting. 20 tablet 0   TRULANCE 3 MG TABS Take 1 tablet by mouth daily.     Current Facility-Administered Medications  Medication Dose Route Frequency Provider Last Rate Last Admin   0.9 %  sodium chloride infusion  500 mL Intravenous Once Jackquline Denmark, MD

## 2022-03-01 ENCOUNTER — Encounter: Payer: Self-pay | Admitting: Hematology and Oncology

## 2022-03-01 ENCOUNTER — Inpatient Hospital Stay: Payer: Medicare Other | Attending: Hematology and Oncology | Admitting: Hematology and Oncology

## 2022-03-01 DIAGNOSIS — Z170 Estrogen receptor positive status [ER+]: Secondary | ICD-10-CM

## 2022-03-01 DIAGNOSIS — Z1509 Genetic susceptibility to other malignant neoplasm: Secondary | ICD-10-CM

## 2022-03-01 DIAGNOSIS — Z720 Tobacco use: Secondary | ICD-10-CM

## 2022-03-01 DIAGNOSIS — C50919 Malignant neoplasm of unspecified site of unspecified female breast: Secondary | ICD-10-CM

## 2022-03-01 DIAGNOSIS — C50411 Malignant neoplasm of upper-outer quadrant of right female breast: Secondary | ICD-10-CM | POA: Diagnosis not present

## 2022-03-01 DIAGNOSIS — D0512 Intraductal carcinoma in situ of left breast: Secondary | ICD-10-CM | POA: Diagnosis not present

## 2022-03-01 DIAGNOSIS — Z1589 Genetic susceptibility to other disease: Secondary | ICD-10-CM

## 2022-03-01 DIAGNOSIS — Z1502 Genetic susceptibility to malignant neoplasm of ovary: Secondary | ICD-10-CM

## 2022-03-02 ENCOUNTER — Encounter: Payer: Self-pay | Admitting: Hematology and Oncology

## 2022-03-02 DIAGNOSIS — Z720 Tobacco use: Secondary | ICD-10-CM

## 2022-03-02 HISTORY — DX: Tobacco use: Z72.0

## 2022-03-02 NOTE — Assessment & Plan Note (Addendum)
Stage 0 ductal carcinoma in situ of the left breast noticed in August 2022.  She underwent bilateral mastectomies with Dr. Lilia Pro, and underwent reconstruction with Dr. Marla Roe.  She remains without evidence of recurrence.  She continues tamoxifen 20 mg daily,

## 2022-03-02 NOTE — Assessment & Plan Note (Addendum)
CHEK2 heterozygous gene mutation.  This is associated with a high risk of breast cancer with her cancer risk being 20-31% when compared to the risk for the general population of 10.2%.  Her risk for developing a secondary primary within 10 years is up to 29%, so additional screening MRI breast was recommended.  This gene is also associated with a possible elevated risk for colorectal cancer, so screening colonoscopy every 5 years instead of 10 years was recommended.  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    EGD and colonoscopy in March 2022 revealed reactive gastropathy, as well as removal of a villous adenoma and hyperplastic polyp.  She is not sure when she is to be scheduled for her next colonoscopy.    She underwent bilateral mastectomies with reconstruction in August 2022.

## 2022-03-02 NOTE — Assessment & Plan Note (Deleted)
EGD and colonoscopy in March 2022 revealed reactive gastropathy seen, a villous adenoma and hyperplastic polyp were removed.  She is not sure when she is to have her next colonoscopy.

## 2022-03-02 NOTE — Assessment & Plan Note (Addendum)
Stage IA (T1b N0 M0) hormone receptor positive breast cancer diagnosed in April 2021.  She was treated with lumpectomy and adjuvant radiation.  She was placed on adjuvant hormonal therapy with tamoxifen 20 mg daily in August 2021.  She continues to tolerate this well, except for fatigue. She remains without evidence of recurrence.  She knows to continue tamoxifen daily.

## 2022-03-02 NOTE — Assessment & Plan Note (Addendum)
She quit smoking in April 2022.  I reinforced the importance of continued abstinence from tobacco.  She previously smoked up to 1.5 packs/day for 33 years, so has up to 49-year pack history of smoking.  She will be eligible for low-dose CT chest for lung cancer screening at age 46.

## 2022-04-04 ENCOUNTER — Other Ambulatory Visit: Payer: Self-pay | Admitting: Oncology

## 2022-04-04 DIAGNOSIS — Z170 Estrogen receptor positive status [ER+]: Secondary | ICD-10-CM

## 2022-06-03 HISTORY — PX: ABDOMINAL HYSTERECTOMY: SHX81

## 2022-07-02 ENCOUNTER — Other Ambulatory Visit: Payer: Self-pay | Admitting: Oncology

## 2022-07-02 ENCOUNTER — Encounter: Payer: Self-pay | Admitting: Oncology

## 2022-07-02 ENCOUNTER — Inpatient Hospital Stay: Payer: Medicare Other | Attending: Oncology | Admitting: Oncology

## 2022-07-02 ENCOUNTER — Telehealth: Payer: Self-pay | Admitting: Oncology

## 2022-07-02 VITALS — BP 116/69 | HR 67 | Temp 98.2°F | Resp 18 | Ht 60.0 in | Wt 132.8 lb

## 2022-07-02 DIAGNOSIS — Z170 Estrogen receptor positive status [ER+]: Secondary | ICD-10-CM

## 2022-07-02 DIAGNOSIS — C50411 Malignant neoplasm of upper-outer quadrant of right female breast: Secondary | ICD-10-CM

## 2022-07-02 DIAGNOSIS — Z780 Asymptomatic menopausal state: Secondary | ICD-10-CM

## 2022-07-02 DIAGNOSIS — R109 Unspecified abdominal pain: Secondary | ICD-10-CM

## 2022-07-02 MED ORDER — ONDANSETRON HCL 4 MG PO TABS: 4.0000 mg | ORAL_TABLET | Freq: Three times a day (TID) | ORAL | 0 refills | Status: DC | PRN

## 2022-07-02 MED ORDER — VENLAFAXINE HCL 37.5 MG PO TABS: 37.5000 mg | ORAL_TABLET | Freq: Two times a day (BID) | ORAL | 5 refills | Status: DC

## 2022-07-02 NOTE — Progress Notes (Signed)
[facility]  [address] [place],  [place]  [address] [phone]  Clinic Day: 07/02/22  Referring physician: Brantley Fling Me*  CHIEF COMPLAINT:  CC:  Stage I A hormone sensitive breast cancer  Current Treatment:   Tamoxifen 20 mg daily   HISTORY OF PRESENT ILLNESS:  Brianna Merritt is a 46 y.o. 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  Annual mammography in March revealed possible asymmetries within the bilateral breasts. Bilateral diagnostic mammogram revealed a heterogeneous hypoechoic irregular mass with microlobulated and angular margins at the 10 o'clock position 6 cm from the nipple measuring 7 x 9 x 6 mm in the right breast.  Ultrasound of the right axilla was negative.  No evidence of malignancy was seen in the left breast.  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  The invasive carcinoma involved all cores measuring 8 mm in maximal extent.  Estrogen receptor was 95% positive and progesterone receptor was 100% positive.  HER2 was equivocal 2+, but HER2 by FISH was negative.    Due to her personal and family history of breast cancer she underwent genetic testing with the [employer] myRisk Hereditary Cancer Panel.  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  This particular mutation is associated with an increased lifetime risk of breast cancer of 20 to 31%, with the risk of a 2nd breast cancer primary within 10 years of her 1st breast cancer up to 29%.  There is insufficient evidence to recommend prophylactic mastectomy with a CHEK2 mutation. There is also possibly an elevated risk of colorectal cancer associated with a CHEK2 mutation, which cannot be quantified. There was also a variant  of uncertain significance (VUS) found in the MSH2.  She was treated with lumpectomy and right axillary sentinel lymph node biopsy in April 2021.  Final pathology revealed an 8 mm, grade 2,  invasive ductal carcinoma with ductal carcinoma in situ, intermediate nuclear grade.  Margins were free of neoplasm.  One sentinel lymph node was negative for metastatic carcinoma (0/1).  She was premenopausal, and had irregular menses, so was taking Lo Loestrin 1 mg/10 mcg for about 5 months prior to her diagnosis.  This was discontinued following her diagnosis.  EndoPredict revealed a 12 gene molecular score of 7.2 with an EPclin score of 3.0, which is considered low risk.  Her risk for distant recurrence within 10 years is 7.4% with adjuvant radiation and endocrine therapy alone, with an absolute chemotherapy benefit of 1.9%.  Her risk for distant recurrence in 5-15 years is 5.8%.  Adjuvant chemotherapy was not recommended.  She completed adjuvant radiation in July. She was placed on adjuvant hormonal therapy with tamoxifen 20 mg daily in September. She was on duloxetine at that time and due to the interaction with tamoxifen, she was switched to venlafaxine, but later stopped it. Her last colonoscopy and EGD were in 2004, at which time colonoscopy was unremarkable. She had GERD with a hiatal hernia, mild distal esophagitis and healed gastric ulcer.  We recommended repeat colonoscopy due to the CHEK2 mutation. EGD and colonoscopy did not reveal any significant abnormality.  She did have removal of 2 polyps, 1 of which was a villous adenoma, and the other a hyperplastic polyp.    She  had an abnormal mammogram that revealed calcifications within the bilateral breasts. On MRI breast these areas were suspicious.  There was no corresponding mass on ultrasound.   MRI guided biopsy of the right breast upper inner quadrant and left breast upper inner quadrant revealed fibroadenomatoid changes and fibrocystic with usual ductal  hyperplasia, fibroadenomatoid changes respectively.  Stereotactic biopsy of 2 areas the right lower outer quadrant was also recommended.  Pathology of the posterior depth revealed diminutive complex sclerosing lesion with calcifications. Pathology of the anterior depth revealed benign glands with calcifications. She was scheduled for a stereotactic biopsy of 2 areas of the left breast upper outer quadrant on May 16th, but she saw Dr. Lilia Pro.  Due to the need for multiple biopsies and increased risk for breast cancer with her CHEK2 mutation, they decided on bilateral mastectomies and immediate breast reconstruction and he referred her to Dr. Marla Roe. She underwent  bilateral mastectomies with Dr. Lilia Pro on August 3rd.  This was followed by immediate reconstruction with Dr. Marla Roe.  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  Margins uninvolved by carcinoma.  Complex sclerosing lesion, fibrocystic changes with apocrine metaplasia and adenosis and calcifications.  Right breast revealed fibroadenomatoid and fibrocystic changes with calcifications.  Previous surgical site changes.  No malignancy identified.    INTERVAL HISTORY:  Brianna Merritt is here for routine follow up of her breast cancer. She reports having a complete hysterectomy/BSO 1 month ago. She is doing well following her surgery, but notes that she has been dealing with nocturia and having difficulty going back to sleep in the middle of the night. She was last seen for postop follow up on 06/16/22 with Dr. Modesta Messing. She was started on Paxil 60m a few weeks ago for her depression and hot flashes. She has had improvement of her mood and hot flashes. However, we discussed that many antidepressants interfere with the Tamoxifen so we will need to switch the Paxil to another antidepressant. She states that she developed some left eye blurred vision a week or so after starting Paxil. She  has tried Celexa in the past which helped with her mood but not her hot flashes. She reports that he appetite is decreased since surgery. She has lost 8lbs since July 2023. She reports accompanying nausea and dizziness which she suspects occurs when she needs to eat something. She has also been dealing with constipation but this is well controlled with Trulance. She states that she is not taking Elavil every night, only as needed. She undergoes routine lab work at her primary care office. She denies fever, chills or other signs of infection. She denies any vomiting. She denies sore throat, cough, dyspnea, or chest pain. She was seen at urgent care today for a laceration on her left 3rd finger. She required only steri-strips for this.  REVIEW OF SYSTEMS:  Review of Systems  Constitutional:  Positive for appetite change and unexpected weight change. Negative for chills and fever.  HENT:  Negative.  Negative for lump/mass, mouth sores and sore throat.   Eyes:  Positive for eye problems.  Respiratory: Negative.  Negative for chest tightness, cough, hemoptysis, shortness of breath and wheezing.   Cardiovascular: Negative.  Negative for chest pain, leg swelling and palpitations.  Gastrointestinal:  Positive for nausea. Negative for abdominal distention, abdominal pain, blood in stool, constipation, diarrhea and vomiting.  Endocrine: Positive for hot flashes.  Genitourinary:  Positive for nocturia. Negative for difficulty urinating, dysuria, frequency  and hematuria.   Musculoskeletal:  Negative for arthralgias, back pain, flank pain and gait problem.  Skin:  Positive for wound.  Neurological:  Positive for dizziness. Negative for extremity weakness, gait problem, headaches, light-headedness, numbness, seizures and speech difficulty.  Hematological: Negative.  Negative for adenopathy. Does not bruise/bleed easily.  Psychiatric/Behavioral:  Positive for depression and sleep disturbance. The patient is not  nervous/anxious.      VITALS:  Blood pressure 116/69, pulse 67, temperature 98.2 F (36.8 C), temperature source Oral, resp. rate 18, height 5' (1.524 m), weight 132 lb 12.8 oz (60.2 kg), SpO2 98 %.  Wt Readings from Last 3 Encounters:  07/02/22 132 lb 12.8 oz (60.2 kg)  03/01/22 140 lb 3.2 oz (63.6 kg)  07/28/21 140 lb 1.6 oz (63.5 kg)    Body mass index is 25.94 kg/m.  Performance status (ECOG): 1 - Symptomatic but completely ambulatory  PHYSICAL EXAM:  Physical Exam Constitutional:      General: She is not in acute distress.    Appearance: Normal appearance. She is normal weight.  HENT:     Head: Normocephalic and atraumatic.  Eyes:     General: No scleral icterus.    Extraocular Movements: Extraocular movements intact.     Conjunctiva/sclera: Conjunctivae normal.     Pupils: Pupils are equal, round, and reactive to light.  Cardiovascular:     Rate and Rhythm: Normal rate and regular rhythm.     Pulses: Normal pulses.     Heart sounds: Normal heart sounds. No murmur heard.    No friction rub. No gallop.  Pulmonary:     Effort: Pulmonary effort is normal. No respiratory distress.     Breath sounds: Normal breath sounds.  Chest:     Comments: Bilateral mastectomies negative Abdominal:     General: Bowel sounds are normal. There is no distension.     Palpations: Abdomen is soft. There is no hepatomegaly, splenomegaly or mass.     Tenderness: There is abdominal tenderness (mild, as expected s/p hysterectomy).  Musculoskeletal:        General: Normal range of motion.     Cervical back: Normal range of motion and neck supple.     Right lower leg: No edema.     Left lower leg: No edema.  Lymphadenopathy:     Cervical: No cervical adenopathy.     Right cervical: No superficial cervical adenopathy.    Left cervical: No superficial cervical adenopathy.     Upper Body:     Right upper body: No supraclavicular or axillary adenopathy.     Left upper body: No supraclavicular  or axillary adenopathy.  Skin:    General: Skin is warm and dry.  Neurological:     General: No focal deficit present.     Mental Status: She is alert and oriented to person, place, and time. Mental status is at baseline.  Psychiatric:        Mood and Affect: Mood normal.        Behavior: Behavior normal.        Thought Content: Thought content normal.        Judgment: Judgment normal.     LABS:      Latest Ref Rng & Units 04/28/2021   12:00 AM 10/03/2020   12:00 AM 07/04/2020   11:22 AM  CBC  WBC  5.2  4.2     6.4   Hemoglobin 12.0 - 16.0 13.1  13.3     13.5  Hematocrit 36 - 46 39  39     40.6   Platelets 150 - 399 244  213     267      This result is from an external source.      Latest Ref Rng & Units 07/01/2021   10:13 AM 04/28/2021   12:00 AM 10/03/2020   12:00 AM  CMP  Glucose 70 - 99 mg/dL 101     BUN 6 - 20 mg/dL _0 Creatinine 0.44 - 1.00 mg/dL 0.71  0.8  0.8      Sodium 135 - 145 mmol/L 137  139  139      Potassium 3.5 - 5.1 mmol/L 3.8  3.7  4.3      Chloride 98 - 111 mmol/L 103  103  108      CO2 22 - 32 mmol/L _1 Calcium 8.9 - 10.3 mg/dL 8.8  8.8  9.0      Alkaline Phos 25 - 125  49  43      AST 13 - 35  29  18      ALT 7 - 35  15  19         This result is from an external source.    STUDIES:  No results found.   Pathology ADDENDUM REPORT: 12/26/2020    ADDENDUM: Pathology revealed DIMINUTIVE COMPLEX SCLEROSING LESION WITH CALCIFICATIONS, BENIGN GLANDS WITH CALCIFICATIONS of the RIGHT breast, lower outer quadrant at posterior depth, (coil clip). The complex sclerosing lesion is small (<2 mm) and likely completely excised. There are reactive nuclear changes and abundant calcifications consistent with the patient's prior radiation. This was found to be concordant by Dr. Peggye Fothergill, with surgical consultation recommended.   Pathology revealed BENIGN GLANDS WITH CALCIFICATIONS of the RIGHT breast, lower at anterior  depth, (X clip). This was found to be concordant by Dr. Peggye Fothergill.   Pathology results were discussed with the patient by telephone. The patient reported doing well after the biopsies with tenderness at the sites. Post biopsy instructions and care were reviewed and questions were answered. The patient was encouraged to call [facility] for any additional concerns. My direct phone number was provided.   Surgical consultation has been arranged with Dr. Orrin Brigham at [facility], [place] location, on Dec 31, 2020.   The patient is scheduled for LEFT breast stereotatic guided biopsy x 2 for segmental calcifications in the upper outer quadrant, anterior and posterior extent, on Jan 05, 2021. Further recommendations will be guided by the results of these biopsies.   Pathology results and recommendations were discussed by telephone with Brianna Merritt at Dr. Maxie Barb office on Dec 26, 2020.   Pathology and imaging reports were faxed to Dr. Lilia Pro on Dec 26, 2020.   Pathology results reported by Terie Purser, RN on 12/26/2020.    HISTORY:   Allergies:  Allergies  Allergen Reactions   Ketorolac Itching   Ketorolac Tromethamine Itching   Sumatriptan Anaphylaxis and Other (See Comments)    Stroke risk  Increased risk for stroke   Meperidine Other (See Comments)    combative  Other reaction(s): Other (See Comments)  Combative    Other reaction(s): Other (See Comments)  combative  Combative   Meperidine Hcl     Combative   Bactrim [Sulfamethoxazole-Trimethoprim] Hives   Triptans Other (See Comments)  Hemiplegic migraine    Current Medications: Current Outpatient Medications  Medication Sig Dispense Refill   fluticasone (FLONASE) 50 MCG/ACT nasal spray Place into the nose.     ibuprofen (ADVIL) 800 MG tablet Take by mouth.     Plecanatide (TRULANCE) 3 MG TABS Take by mouth.     amitriptyline (ELAVIL) 25 MG tablet Take 25 mg by  mouth at bedtime.     clonazePAM (KLONOPIN) 0.5 MG tablet Take by mouth.     diphenhydramine-acetaminophen (TYLENOL PM) 25-500 MG TABS tablet Take 1 tablet by mouth at bedtime as needed.     levalbuterol (XOPENEX HFA) 45 MCG/ACT inhaler SMARTSIG:1 Puff(s) By Mouth 1-4 Times Daily     methocarbamol (ROBAXIN) 500 MG tablet TAKE 1 TABLET BY MOUTH EVERY 6 HOURS AS NEEDED FOR MUSCLE SPASM 30 tablet 5   omeprazole (PRILOSEC) 20 MG capsule Take 1 capsule (20 mg total) by mouth daily. Please call [phone] to schedule an office visit for refills 30 capsule 2   ondansetron (ZOFRAN) 4 MG tablet Take 1 tablet (4 mg total) by mouth every 8 (eight) hours as needed for nausea or vomiting. 20 tablet 0   tamoxifen (NOLVADEX) 20 MG tablet Take 1 tablet by mouth once daily 90 tablet 3   venlafaxine (EFFEXOR) 37.5 MG tablet Take 1 tablet (37.5 mg total) by mouth 2 (two) times daily with a meal. 60 tablet 5   Current Facility-Administered Medications  Medication Dose Route Frequency Provider Last Rate Last Admin   0.9 %  sodium chloride infusion  500 mL Intravenous Once Jackquline Denmark, MD         ASSESSMENT & PLAN:   Assessment:  1.  Stage IA (T1b N0 M0) hormone receptor positive breast cancer, treated with lumpectomy and adjuvant radiation.  She continues adjuvant hormonal therapy with tamoxifen daily and is tolerating this well, except for fatigue. She remains without evidence of recurrence.  2.  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  She has undergone bilateral mastectomies with Dr. Lilia Pro, and underwent reconstruction with Dr. Marla Roe.  3.  CHEK2 heterozygous gene mutation.  This is a high risk gene related to an increased risk of breast cancer with her cancer risk being 20-31% when compared to the risk for the general population of 10.2%.  Her risk for developing a secondary primary within 10 years is up to 29%.  This gene is also associated with a possible elevated risk for  colorectal cancer.  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 EGD and colonoscopy in March 2022 was negative.  4.  Seizure disorder; she has been without an episode in about 4 years.  5.  Depression/anxiety and menopausal symptoms. She continues clonazepam. We will discontinue her Paxil given the potential for interaction with Tamoxifen. I prescribed Effexor 37.63m BID today.  6. S/p hysterectomy October 2023. She is recovering well and has appropriate mild abdominal TTP today.  Plan:      She knows to continue Tamoxifen daily. We will discontinue her Paxil and start Effexor 37.526mBID. We did discuss that she does have an option of switching to an aromatase inhibitor now that she has had a hysterectomy and BSO. We will get a bone density scan in the next year or so. We will plan to see her back in 6 months for re-examination. The patient understands the plans discussed today and is in agreement with them.  She knows to contact  our office if she develops concerns prior to her next appointment.   I,Alexis Herring,acting as a scribe for Derwood Kaplan, MD.,have documented all relevant documentation on the behalf of Derwood Kaplan, MD,as directed by  Derwood Kaplan, MD while in the presence of Derwood Kaplan, MD.

## 2022-07-02 NOTE — Telephone Encounter (Signed)
07/02/22 Next appt scheduled and confirmed with patient

## 2022-07-06 ENCOUNTER — Ambulatory Visit: Payer: Medicare Other | Admitting: Dietician

## 2022-07-06 ENCOUNTER — Telehealth: Payer: Self-pay | Admitting: Dietician

## 2022-07-06 NOTE — Progress Notes (Signed)
Nutrition Assessment   Reason for Assessment: MST screen for weight loss.    ASSESSMENT: Patient is 46 year old female with Stage IA (T1b N0 M0) hormone receptor positive breast cancer, treated with lumpectomy and adjuvant radiation who is S/P recent hysterectomy.    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  She has never been a breakfast eater her body is just not ready for food, and with morning routine she just doesn't tend to eat.  Much of her calories come liquids.  She has some trouble eating meals when she cooks them and has been smelling the  while cooking.  She goes on trends of eating cereal with milk.  She reports she likes fruits, and vegetables and varied plant based foods just doesn't think to pack them.    Anthropometrics: 8# (5.7%) past 4 months  Height: 60" Weight:  07/02/22  132.8# 03/01/22  140.3# UBW: 130-140 BMI: 25.94  INTERVENTION:   Encouraged nutrient dense snacking when possible to maintain weight/strength  Suggested carb controlled oral nutrition supplements if unable to eat  Emailed Nutrition Tip sheet  for  High Protein High Calorie Snacking, Breast cancer survival  Contact information provided if unable to maintain weight   MONITORING, EVALUATION, GOAL: weight, PO intake, Nutrition Impact Symptoms, labs Goal is weight maintenance  Next Visit: PRN at patient or provider request  Brianna Merritt, RDN, LDN Registered Dietitian, [place] Part Time Remote (Usual office hours: Tuesday-Thursday) Cell: [phone]

## 2022-07-06 NOTE — Telephone Encounter (Signed)
Patient screened on MST. First attempt to reach. She reports she is at school right now would refer I call back later after 4:00pm.   [name], RDN, LDN Registered Dietitian, [facility] Part Time Remote (Usual office hours: Tuesday-Thursday) Cell: [phone]

## 2022-07-27 ENCOUNTER — Telehealth: Payer: Self-pay | Admitting: *Deleted

## 2022-07-27 NOTE — Telephone Encounter (Signed)
Received DME Standard Written Order on (07/19/22) via of fax from [facility].  Requesting signature and return.  Given to provider to sign.    DME Standard Written Order signed and faxed back to [facility].  Confirmation received and copy scanned into the chart.//AB/CMA

## 2022-09-26 ENCOUNTER — Other Ambulatory Visit: Payer: Self-pay | Admitting: Oncology

## 2022-09-26 DIAGNOSIS — Z170 Estrogen receptor positive status [ER+]: Secondary | ICD-10-CM

## 2022-12-30 NOTE — Progress Notes (Addendum)
[facility]  [address] [place],  [place]  [address] [phone]  Clinic Day: 12/31/2022  Referring physician: Lemar Livings Me*  CHIEF COMPLAINT:  CC:  Stage I A hormone sensitive breast cancer  Current Treatment:   Tamoxifen 20 mg daily  HISTORY OF PRESENT ILLNESS:  Brianna Merritt is a 47 y.o. 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  Annual mammography in March revealed possible asymmetries within the bilateral breasts. Bilateral diagnostic mammogram revealed a heterogeneous hypoechoic irregular mass with microlobulated and angular margins at the 10 o'clock position 6 cm from the nipple measuring 7 x 9 x 6 mm in the right breast.  Ultrasound of the right axilla was negative.  No evidence of malignancy was seen in the left breast.  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  The invasive carcinoma involved all cores measuring 8 mm in maximal extent.  Estrogen receptor was 95% positive and progesterone receptor was 100% positive.  HER2 was equivocal 2+, but HER2 by FISH was negative.    Due to her personal and family history of breast cancer she underwent genetic testing with the [employer] myRisk Hereditary Cancer Panel.  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  This particular mutation is associated with an increased lifetime risk of breast cancer of 20 to 31%, with the risk of a 2nd breast cancer primary within 10 years of her 1st breast cancer up to 29%.  There is insufficient evidence to recommend prophylactic mastectomy with a CHEK2 mutation. There is also possibly an elevated risk of colorectal cancer associated with a CHEK2 mutation, which cannot be quantified. There was also a variant  of uncertain significance (VUS) found in the MSH2.  She was treated with lumpectomy and right axillary sentinel lymph node biopsy in April 2021.  Final pathology revealed an 8 mm, grade 2,  invasive ductal carcinoma with ductal carcinoma in situ, intermediate nuclear grade.  Margins were free of neoplasm.  One sentinel lymph node was negative for metastatic carcinoma (0/1).  She was premenopausal, and had irregular menses, so was taking Lo Loestrin 1 mg/10 mcg for about 5 months prior to her diagnosis.  This was discontinued following her diagnosis.  EndoPredict revealed a 12 gene molecular score of 7.2 with an EPclin score of 3.0, which is considered low risk.  Her risk for distant recurrence within 10 years is 7.4% with adjuvant radiation and endocrine therapy alone, with an absolute chemotherapy benefit of 1.9%.  Her risk for distant recurrence in 5-15 years is 5.8%.  Adjuvant chemotherapy was not recommended.  She completed adjuvant radiation in July. She was placed on adjuvant hormonal therapy with tamoxifen 20 mg daily in September. She was on duloxetine at that time and due to the interaction with tamoxifen, she was switched to venlafaxine, but later stopped it. Her last colonoscopy and EGD were in 2004, at which time colonoscopy was unremarkable. She had GERD with a hiatal hernia, mild distal esophagitis and healed gastric ulcer.  We recommended repeat colonoscopy due to the CHEK2 mutation. EGD and colonoscopy did not reveal any significant abnormality.  She did have removal of 2 polyps, 1 of which was a villous adenoma, and the other a hyperplastic polyp.    She had  an abnormal mammogram that revealed calcifications within the bilateral breasts. On MRI breast these areas were suspicious.  There was no corresponding mass on ultrasound.   MRI guided biopsy of the right breast upper inner quadrant and left breast upper inner quadrant revealed fibroadenomatoid changes and fibrocystic with usual ductal  hyperplasia, fibroadenomatoid changes respectively.  Stereotactic biopsy of 2 areas the right lower outer quadrant was also recommended.  Pathology of the posterior depth revealed diminutive complex sclerosing lesion with calcifications. Pathology of the anterior depth revealed benign glands with calcifications. She was scheduled for a stereotactic biopsy of 2 areas of the left breast upper outer quadrant on May 16th, but she saw Dr. Lequita Halt.  Due to the need for multiple biopsies and increased risk for breast cancer with her CHEK2 mutation, they decided on bilateral mastectomies and immediate breast reconstruction and he referred her to Dr. Ulice Bold. She underwent  bilateral mastectomies with Dr. Lequita Halt on August 3rd.  This was followed by immediate reconstruction with Dr. Ulice Bold.  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  Margins uninvolved by carcinoma.  Complex sclerosing lesion, fibrocystic changes with apocrine metaplasia and adenosis and calcifications.  Right breast revealed fibroadenomatoid and fibrocystic changes with calcifications.  Previous surgical site changes.  No malignancy identified.   She has had a complete hysterectomy/BSO last year in 2023.    INTERVAL HISTORY:  Brianna Merritt is here for routine follow up of her stage I A hormone sensitive breast cancer. Patient states that she feels ok and complains of hot flashes and mood swings. She states that she has a constant dull pain and occasional sharp pain of her breast and pressure of her new implants. She takes tylenol and Ibuprofen to no avail but states that Robaxin is the only thing that gives her relief. She states that she has a low libido and has vaginal dryness. I informed her that I can prescribe an estrogen cream that is safe to help with the dryness and will not be absorbed systemically. She is having severe menopausal symptoms. I will prescribe premarin cream  for her to use 2-3 times a week before decreasing to once a week. I will see her back in 6 months for reevaluation. She denies signs of infection such as sore throat, sinus drainage, cough, or urinary symptoms.  She denies fevers or recurrent chills. She denies pain. She denies nausea, vomiting, chest pain, dyspnea or cough. Her appetite is on and off and her weight has increased 7 pounds over last 7 months  REVIEW OF SYSTEMS:  Review of Systems  Constitutional:  Positive for appetite change. Negative for chills, diaphoresis, fatigue, fever and unexpected weight change.  HENT:  Negative.  Negative for hearing loss, lump/mass, mouth sores, nosebleeds, sore throat, tinnitus, trouble swallowing and voice change.   Eyes: Negative.  Negative for eye problems and icterus.  Respiratory: Negative.  Negative for chest tightness, cough, hemoptysis, shortness of breath and wheezing.   Cardiovascular: Negative.  Negative for chest pain, leg swelling and palpitations.       Pain and pressure of her breast/implants  Gastrointestinal: Negative.  Negative for abdominal distention, abdominal pain, blood in stool, constipation, diarrhea, nausea, rectal pain and vomiting.  Endocrine: Positive for hot flashes.       Low libido  Genitourinary: Negative.  Negative for bladder incontinence, difficulty urinating, dyspareunia, dysuria, frequency, hematuria, menstrual problem, nocturia, pelvic pain, vaginal bleeding and vaginal discharge.  Vaginal dryness  Musculoskeletal: Negative.  Negative for arthralgias, back pain, flank pain, gait problem, myalgias, neck pain and neck stiffness.  Skin: Negative.  Negative for itching, rash and wound.  Neurological: Negative.  Negative for dizziness, extremity weakness, gait problem, headaches, light-headedness, numbness, seizures and speech difficulty.  Hematological: Negative.  Negative for adenopathy. Does not bruise/bleed easily.  Psychiatric/Behavioral:  Positive for  depression and sleep disturbance. Negative for confusion, decreased concentration and suicidal ideas. The patient is not nervous/anxious.        Mood swings     VITALS:  Blood pressure 102/61, pulse 73, temperature 98.1 F (36.7 C), temperature source Oral, resp. rate 18, height 5' (1.524 m), weight 139 lb 4.8 oz (63.2 kg), SpO2 100 %.  Wt Readings from Last 3 Encounters:  12/31/22 139 lb 4.8 oz (63.2 kg)  07/02/22 132 lb 12.8 oz (60.2 kg)  03/01/22 140 lb 3.2 oz (63.6 kg)    Body mass index is 27.21 kg/m.  Performance status (ECOG): 1 - Symptomatic but completely ambulatory  PHYSICAL EXAM:  Physical Exam Vitals and nursing note reviewed.  Constitutional:      General: She is not in acute distress.    Appearance: Normal appearance. She is normal weight. She is not ill-appearing, toxic-appearing or diaphoretic.  HENT:     Head: Normocephalic and atraumatic.     Right Ear: Tympanic membrane, ear canal and external ear normal. There is no impacted cerumen.     Left Ear: Tympanic membrane, ear canal and external ear normal. There is no impacted cerumen.     Nose: Nose normal. No congestion or rhinorrhea.     Mouth/Throat:     Mouth: Mucous membranes are moist.     Pharynx: Oropharynx is clear. No oropharyngeal exudate or posterior oropharyngeal erythema.  Eyes:     General: No scleral icterus.       Right eye: No discharge.        Left eye: No discharge.     Extraocular Movements: Extraocular movements intact.     Conjunctiva/sclera: Conjunctivae normal.     Pupils: Pupils are equal, round, and reactive to light.  Neck:     Vascular: No carotid bruit.  Cardiovascular:     Rate and Rhythm: Normal rate and regular rhythm.     Pulses: Normal pulses.     Heart sounds: Normal heart sounds. No murmur heard.    No friction rub. No gallop.  Pulmonary:     Effort: Pulmonary effort is normal. No respiratory distress.     Breath sounds: Normal breath sounds. No stridor. No wheezing,  rhonchi or rales.  Chest:     Chest wall: No tenderness.     Comments: Bilateral reconstructions negative Abdominal:     General: Bowel sounds are normal. There is no distension.     Palpations: Abdomen is soft. There is no hepatomegaly, splenomegaly or mass.     Tenderness: There is no abdominal tenderness. There is no right CVA tenderness, left CVA tenderness, guarding or rebound.     Hernia: No hernia is present.  Musculoskeletal:        General: No swelling, tenderness, deformity or signs of injury. Normal range of motion.     Cervical back: Normal range of motion and neck supple. No rigidity or tenderness.     Right lower leg: No edema.     Left lower leg: No edema.  Lymphadenopathy:     Cervical: No cervical adenopathy.     Right  cervical: No superficial cervical adenopathy.    Left cervical: No superficial cervical adenopathy.     Upper Body:     Right upper body: No supraclavicular or axillary adenopathy.     Left upper body: No supraclavicular or axillary adenopathy.  Skin:    General: Skin is warm and dry.     Coloration: Skin is not jaundiced or pale.     Findings: No bruising, erythema, lesion or rash.  Neurological:     General: No focal deficit present.     Mental Status: She is alert and oriented to person, place, and time. Mental status is at baseline.     Cranial Nerves: No cranial nerve deficit.     Sensory: No sensory deficit.     Motor: No weakness.     Coordination: Coordination normal.     Gait: Gait normal.     Deep Tendon Reflexes: Reflexes normal.  Psychiatric:        Mood and Affect: Mood normal.        Behavior: Behavior normal.        Thought Content: Thought content normal.        Judgment: Judgment normal.     LABS:      Latest Ref Rng & Units 04/28/2021   12:00 AM 10/03/2020   12:00 AM 07/04/2020   11:22 AM  CBC  WBC  5.2  4.2     6.4   Hemoglobin 12.0 - 16.0 13.1  13.3     13.5   Hematocrit 36 - 46 39  39     40.6   Platelets 150 - 399  244  213     267      This result is from an external source.      Latest Ref Rng & Units 07/01/2021   10:13 AM 04/28/2021   12:00 AM 10/03/2020   12:00 AM  CMP  Glucose 70 - 99 mg/dL 161     BUN 6 - 20 mg/dL 10  13  10       Creatinine 0.44 - 1.00 mg/dL 0.96  0.8  0.8      Sodium 135 - 145 mmol/L 137  139  139      Potassium 3.5 - 5.1 mmol/L 3.8  3.7  4.3      Chloride 98 - 111 mmol/L 103  103  108      CO2 22 - 32 mmol/L 25  25  26       Calcium 8.9 - 10.3 mg/dL 8.8  8.8  9.0      Alkaline Phos 25 - 125  49  43      AST 13 - 35  29  18      ALT 7 - 35  15  19         This result is from an external source.   STUDIES:  No results found.   HISTORY:   Allergies:  Allergies  Allergen Reactions   Ketorolac Itching   Ketorolac Tromethamine Itching   Sumatriptan Anaphylaxis and Other (See Comments)    Stroke risk  Increased risk for stroke   Meperidine Other (See Comments)    combative  Other reaction(s): Other (See Comments)  Combative    Other reaction(s): Other (See Comments)  combative  Combative  Combative, , Other reaction(s): Other (See Comments), combative, Combative   Meperidine Hcl     Combative   Bactrim [Sulfamethoxazole-Trimethoprim] Hives   Triptans Other (See Comments)  Hemiplegic migraine   Ibuprofen Itching and Rash    Current Medications: Current Outpatient Medications  Medication Sig Dispense Refill   clonazePAM (KLONOPIN) 0.5 MG tablet Take 1 mg by mouth at bedtime.     conjugated estrogens (PREMARIN) vaginal cream Place 1 Applicatorful vaginally daily. 42.5 g 12   diphenhydramine-acetaminophen (TYLENOL PM) 25-500 MG TABS tablet Take 1 tablet by mouth at bedtime as needed.     fluticasone (FLONASE) 50 MCG/ACT nasal spray Place into the nose.     ibuprofen (ADVIL) 800 MG tablet Take by mouth.     levalbuterol (XOPENEX HFA) 45 MCG/ACT inhaler SMARTSIG:1 Puff(s) By Mouth 1-4 Times Daily     methocarbamol (ROBAXIN) 500 MG tablet TAKE 1 TABLET  BY MOUTH EVERY 6 HOURS AS NEEDED FOR MUSCLE SPASM 60 tablet 5   omeprazole (PRILOSEC) 20 MG capsule Take 1 capsule (20 mg total) by mouth daily. Please call [phone] to schedule an office visit for refills 30 capsule 2   ondansetron (ZOFRAN) 4 MG tablet Take 1 tablet (4 mg total) by mouth every 8 (eight) hours as needed for nausea or vomiting. 20 tablet 0   Plecanatide (TRULANCE) 3 MG TABS Take by mouth.     tamoxifen (NOLVADEX) 20 MG tablet Take 1 tablet by mouth once daily 90 tablet 3   venlafaxine (EFFEXOR) 37.5 MG tablet Take 1 tablet (37.5 mg total) by mouth 2 (two) times daily with a meal. (Patient taking differently: Take 37.5 mg by mouth in the morning.) 60 tablet 5   Current Facility-Administered Medications  Medication Dose Route Frequency Provider Last Rate Last Admin   0.9 %  sodium chloride infusion  500 mL Intravenous Once Lynann Bologna, MD         ASSESSMENT & PLAN:  Assessment:  1.  Stage IA (T1b N0 M0) hormone receptor positive breast cancer April 2021, treated with lumpectomy and adjuvant radiation.  She continues adjuvant hormonal therapy with tamoxifen daily and is tolerating this well, except for fatigue. She remains without evidence of recurrence.  2.  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  She has undergone bilateral mastectomies with Dr. Lequita Halt, and underwent reconstruction with Dr. Ulice Bold.  3.  CHEK2 heterozygous gene mutation.  This is a high risk gene related to an increased risk of breast cancer with her cancer risk being 20-31% when compared to the risk for the general population of 10.2%.  Her risk for developing a secondary primary within 10 years is up to 29%.  This gene is also associated with a possible elevated risk for colorectal cancer.  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 EGD and colonoscopy in March 2022 was negative.  4.  Seizure disorder; she has been  without an episode for about 4 years.  5.  Depression/anxiety and severe menopausal symptoms. She continues clonazepam. We will discontinue her Paxil given the potential for interaction with Tamoxifen. I prescribed Effexor 37.5mg  BID today.  6. S/p hysterectomy October 2023. She is recovering well and has appropriate mild abdominal tenderness to palpation today.  Plan:      She takes tylenol and Ibuprofen for her breast pain to no avail, but states that Robaxin is the only thing that gives her relief. She states that she has a low libido and has vaginal dryness. I informed her that I can prescribe a estrogen cream that is perfectly safe to help with the dryness and will not be  absorbed systemically. She is having severe menopausal symptoms.  I will prescribe premarin cream for her to use 2-3 times a week before decreasing to once a week. I will see her back in 6 months for reevaluation. The patient understands the plans discussed today and is in agreement with them.  She knows to contact our office if she develops concerns prior to her next appointment.  I provided 30 minutes of face-to-face time during this this encounter and > 50% was spent counseling as documented under my assessment and plan.   I,Jasmine M Lassiter,acting as a scribe for Dellia Beckwith, MD.,have documented all relevant documentation on the behalf of Dellia Beckwith, MD,as directed by  Dellia Beckwith, MD while in the presence of Dellia Beckwith, MD.

## 2022-12-31 ENCOUNTER — Encounter: Payer: Self-pay | Admitting: Oncology

## 2022-12-31 ENCOUNTER — Other Ambulatory Visit: Payer: Self-pay | Admitting: Oncology

## 2022-12-31 ENCOUNTER — Inpatient Hospital Stay: Payer: Medicare Other | Attending: Oncology | Admitting: Oncology

## 2022-12-31 VITALS — BP 102/61 | HR 73 | Temp 98.1°F | Resp 18 | Ht 60.0 in | Wt 139.3 lb

## 2022-12-31 DIAGNOSIS — C50411 Malignant neoplasm of upper-outer quadrant of right female breast: Secondary | ICD-10-CM

## 2022-12-31 DIAGNOSIS — Z170 Estrogen receptor positive status [ER+]: Secondary | ICD-10-CM | POA: Diagnosis not present

## 2022-12-31 DIAGNOSIS — N952 Postmenopausal atrophic vaginitis: Secondary | ICD-10-CM

## 2022-12-31 MED ORDER — ESTROGENS CONJUGATED 0.625 MG/GM VA CREA: 1.0000 | TOPICAL_CREAM | Freq: Every day | VAGINAL | 12 refills | Status: AC

## 2022-12-31 MED ORDER — METHOCARBAMOL 500 MG PO TABS: ORAL_TABLET | ORAL | 5 refills | Status: DC

## 2023-01-01 ENCOUNTER — Telehealth: Payer: Self-pay | Admitting: Oncology

## 2023-01-01 NOTE — Telephone Encounter (Signed)
Patient has been scheduled. Aware of appt date and time   Scheduling Message Entered by Gery Pray H on 12/31/2022 at  4:20 PM Priority: Routine <No visit type provided>  Department: [facility]  Provider:  Scheduling Notes:  RT 6 months

## 2023-04-18 ENCOUNTER — Telehealth: Payer: Self-pay

## 2023-04-18 ENCOUNTER — Telehealth: Payer: Self-pay | Admitting: *Deleted

## 2023-04-18 NOTE — Telephone Encounter (Signed)
Received on (04/05/2023) via of fax DME Standard Written Order-Amended Request from [facility].  Requesting signature and return.  Given to provider to sign and return.  DME Standard Written Order-Amended Request signed and faxed back to [facility].  Confirmation received and copy scanned into the chart.//AB/CMA

## 2023-04-18 NOTE — Telephone Encounter (Signed)
Pt LVM on nurse line that her PCP, Mikki Santee, has left the practice and she is need of routine med refills. She wants to know if Dr Gilman Buttner would give her a one month refill, until she can establish w/new PCP? She is concerned about going 1 month without her meds.

## 2023-04-20 ENCOUNTER — Other Ambulatory Visit: Payer: Self-pay | Admitting: Oncology

## 2023-04-20 DIAGNOSIS — G893 Neoplasm related pain (acute) (chronic): Secondary | ICD-10-CM

## 2023-04-20 DIAGNOSIS — N951 Menopausal and female climacteric states: Secondary | ICD-10-CM

## 2023-04-20 DIAGNOSIS — F419 Anxiety disorder, unspecified: Secondary | ICD-10-CM

## 2023-04-20 MED ORDER — CLONAZEPAM 0.5 MG PO TABS: 1.0000 mg | ORAL_TABLET | Freq: Every day | ORAL | 0 refills | Status: DC

## 2023-04-20 MED ORDER — VENLAFAXINE HCL 37.5 MG PO TABS: 37.5000 mg | ORAL_TABLET | Freq: Every day | ORAL | 5 refills | Status: DC

## 2023-04-20 MED ORDER — IBUPROFEN 800 MG PO TABS: 800.0000 mg | ORAL_TABLET | Freq: Four times a day (QID) | ORAL | 0 refills | Status: DC | PRN

## 2023-04-20 NOTE — Telephone Encounter (Signed)
Pt is not allergic to ibuprofen, has been taking it

## 2023-07-08 ENCOUNTER — Inpatient Hospital Stay: Payer: Medicare Other | Admitting: Oncology

## 2023-07-15 ENCOUNTER — Ambulatory Visit: Payer: Medicare Other | Admitting: Hematology and Oncology

## 2023-07-18 ENCOUNTER — Inpatient Hospital Stay: Payer: Medicare Other | Attending: Hematology and Oncology | Admitting: Hematology and Oncology

## 2023-07-18 ENCOUNTER — Encounter: Payer: Self-pay | Admitting: Hematology and Oncology

## 2023-07-18 ENCOUNTER — Telehealth: Payer: Self-pay | Admitting: Hematology and Oncology

## 2023-07-18 VITALS — BP 113/68 | HR 74 | Temp 98.5°F | Resp 16 | Ht 60.0 in | Wt 143.9 lb

## 2023-07-18 DIAGNOSIS — Z1509 Genetic susceptibility to other malignant neoplasm: Secondary | ICD-10-CM | POA: Diagnosis not present

## 2023-07-18 DIAGNOSIS — Z79810 Long term (current) use of selective estrogen receptor modulators (SERMs): Secondary | ICD-10-CM | POA: Diagnosis not present

## 2023-07-18 DIAGNOSIS — Z1502 Genetic susceptibility to malignant neoplasm of ovary: Secondary | ICD-10-CM | POA: Diagnosis not present

## 2023-07-18 DIAGNOSIS — Z923 Personal history of irradiation: Secondary | ICD-10-CM | POA: Diagnosis not present

## 2023-07-18 DIAGNOSIS — Z1589 Genetic susceptibility to other disease: Secondary | ICD-10-CM

## 2023-07-18 DIAGNOSIS — D0512 Intraductal carcinoma in situ of left breast: Secondary | ICD-10-CM | POA: Diagnosis present

## 2023-07-18 DIAGNOSIS — C50919 Malignant neoplasm of unspecified site of unspecified female breast: Secondary | ICD-10-CM

## 2023-07-18 DIAGNOSIS — Z9013 Acquired absence of bilateral breasts and nipples: Secondary | ICD-10-CM | POA: Diagnosis not present

## 2023-07-18 DIAGNOSIS — Z87891 Personal history of nicotine dependence: Secondary | ICD-10-CM | POA: Insufficient documentation

## 2023-07-18 NOTE — Telephone Encounter (Signed)
07/18/23 Next appt scheduled and confirmed with patient.

## 2023-07-18 NOTE — Progress Notes (Signed)
[facility]  [address] [place],  [place]  [address] [phone]  Clinic Day:  07/18/2023  Referring physician: Lemar Livings Me*   CHIEF COMPLAINT:  CC: Stage IA hormone receptor positive breast cancer  Current Treatment:  Tamoxifen 20 mg daily  HISTORY OF PRESENT ILLNESS:  Brianna Merritt is a 47 y.o. 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  Annual mammography in March revealed possible asymmetries within the bilateral breasts. Bilateral diagnostic mammogram revealed a heterogeneous hypoechoic irregular mass with microlobulated and angular margins at the 10 o'clock position 6 cm from the nipple measuring 7 x 9 x 6 mm in the right breast.  Ultrasound of the right axilla was negative.  No evidence of malignancy was seen in the left breast.  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  The invasive carcinoma involved all cores measuring 8 mm in maximal extent.  Estrogen receptor was 95% positive and progesterone receptor was 100% positive.  HER2 was equivocal (2+), negative by FISH.     Due to her personal and family history of breast cancer she underwent preoperative genetic testing with the [employer] myRisk Hereditary Cancer Panel.  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  This particular mutation is associated with an increased lifetime risk of breast cancer of 20 to 31%, with the risk of a 2nd breast cancer primary within 10 years of her 1st breast cancer up to 29%.  There is insufficient evidence to recommend prophylactic mastectomy with a CHEK2 mutation. There is also possibly an elevated risk of colorectal cancer associated with a CHEK2 mutation, which cannot be quantified. There was also a  variant of uncertain significance (VUS) found in the MSH2.   She was treated with right lumpectomy and sentinel lymph node biopsy in April 2021.  Final pathology revealed an 8 mm, grade 2,  invasive ductal carcinoma with ductal carcinoma in situ, intermediate nuclear grade.  Margins were free of neoplasm.  One sentinel lymph node was negative for metastatic carcinoma (0/1).  She was premenopausal, and had irregular menses, so was taking Lo Loestrin 1 mg/10 mcg for about 5 months prior to her diagnosis.  This was discontinued following her diagnosis.  EndoPredict revealed a 12 gene molecular score of 7.2 with an EPclin score of 3.0, which is considered low risk.  Her risk for distant recurrence within 10 years is 7.4% with adjuvant radiation and endocrine therapy alone, with an absolute chemotherapy benefit of 1.9%.  Her risk for distant recurrence in 5-15 years is 5.8%.  Adjuvant chemotherapy was not recommended.  She completed adjuvant radiation to the right breast in July. She was placed on adjuvant hormonal therapy with tamoxifen 20 mg daily in September 2021. She was on duloxetine at that time and due to the interaction with tamoxifen, she was switched to venlafaxine, but later stopped it.   Colonoscopy and EGD were done in 2004.  Colonoscopy was unremarkable.  EGD revealed hiatal hernia, mild distal esophagitis and healed gastric ulcer.  We recommended repeat colonoscopy due to the CHEK2 mutation. EGD and colonoscopy in March 2022 did not reveal any significant abnormality.  She did have removal of 2 polyps, 1 of which was a villous adenoma, and the other a hyperplastic polyp.  She had an abnormal mammogram in March 2022 that revealed calcifications within the bilateral breasts. On MRI breast these areas were suspicious.  There was no corresponding mass on ultrasound.   MRI guided biopsy of the right breast upper inner quadrant and left breast upper inner quadrant revealed fibroadenomatoid changes and  fibrocystic with usual ductal hyperplasia, fibroadenomatoid changes respectively.  Stereotactic biopsy of 2 areas the right lower outer quadrant was also recommended.  Pathology of the posterior depth revealed diminutive complex sclerosing lesion with calcifications. Pathology of the anterior depth revealed benign glands with calcifications. She was scheduled for a stereotactic biopsy of 2 areas of the left breast upper outer quadrant, but she saw Dr. Lequita Halt.  Due to the need for multiple biopsies and increased risk for breast cancer with her CHEK2 mutation, they decided on bilateral mastectomies and immediate breast reconstruction which was done in August 2022.  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  Margins uninvolved by carcinoma.  Complex sclerosing lesion, fibrocystic changes with apocrine metaplasia and adenosis and calcifications.  Right breast revealed fibroadenomatoid and fibrocystic changes with calcifications.  Previous surgical site changes.  No malignancy identified.    She underwent hysterectomy/BSO in 2023.  Oncology History  Malignant neoplasm of upper-outer quadrant of right female breast (HCC)  12/06/2019 Initial Diagnosis   Breast cancer, right (HCC)   12/06/2019 Cancer Staging   Staging form: Breast, AJCC 8th Edition - Clinical stage from 12/06/2019: Stage IA (cT1b, cN0(sn), cM0, G2, ER+, PR+, HER2-) - Signed by Dellia Beckwith, MD on 07/07/2020   Ductal carcinoma in situ of left breast  03/31/2021 Cancer Staging   Staging form: Breast, AJCC 8th Edition - Clinical stage from 03/31/2021: Stage 0 (cTis (DCIS), cN0, cM0, G2, ER+, PR+, HER2: Not Assessed) - Signed by Dellia Beckwith, MD on 05/01/2021 Histopathologic type: Intraductal carcinoma, noninfiltrating, NOS Stage prefix: Initial diagnosis Method of lymph node assessment: Clinical Nuclear grade: G2 Multigene prognostic tests performed:  None Histologic grading system: 3 grade system Laterality: Left Tumor size (mm): 3 Lymph-vascular invasion (LVI): LVI not present (absent)/not identified Diagnostic confirmation: Positive histology Specimen type: Excision Staged by: Managing physician Menopausal status: Premenopausal Prognostic indicators: Incidental finding at bilat mast for CHEK 2 mutation, prior invasive breast cancer in the contralateral breast Stage used in treatment planning: Yes National guidelines used in treatment planning: Yes Type of national guideline used in treatment planning: NCCN   05/01/2021 Initial Diagnosis   Ductal carcinoma in situ of left breast       INTERVAL HISTORY:  Brianna Merritt is here today for repeat clinical assessment. She states she continues tamoxifen daily without significant difficulty. She has had hot flashes, which have lessened with time.  She has occasional cramping, shooting pain in the right chest wall. She otherwise denies changes in her reconstructions.  She denies clear nasal drainage and sinus congestion which she attributes to seasonal allergies.  She denies fevers or chills. She denies pain elsewhere. Her appetite is good. Her weight has increased 4 pounds over last 6 months .  REVIEW OF SYSTEMS:  Review of Systems  Constitutional:  Negative for appetite change, chills, fatigue, fever and unexpected weight change.  HENT:   Negative for lump/mass, mouth sores and sore throat.   Respiratory:  Negative for cough and shortness of breath.   Cardiovascular:  Negative for chest pain and leg swelling.  Gastrointestinal:  Negative for abdominal pain, constipation, diarrhea, nausea and vomiting.  Endocrine: Positive for hot flashes.  Genitourinary:  Negative for difficulty urinating, dysuria, frequency, hematuria and vaginal bleeding.   Musculoskeletal:  Negative for arthralgias, back pain, gait problem and myalgias.  Skin:  Negative for itching and rash.  Neurological:  Negative for  dizziness, gait problem, headaches and light-headedness.  Hematological:  Negative for adenopathy. Does not bruise/bleed easily.  Psychiatric/Behavioral:  Negative for depression and sleep disturbance. The patient is not nervous/anxious.      VITALS:  Blood pressure 113/68, pulse 74, temperature 98.5 F (36.9 C), temperature source Oral, resp. rate 16, height 5' (1.524 m), weight 143 lb 14.4 oz (65.3 kg), last menstrual period 12/21/2020, SpO2 100%.  Wt Readings from Last 3 Encounters:  07/18/23 143 lb 14.4 oz (65.3 kg)  12/31/22 139 lb 4.8 oz (63.2 kg)  07/02/22 132 lb 12.8 oz (60.2 kg)    Body mass index is 28.1 kg/m.  Performance status (ECOG): 0 - Asymptomatic  PHYSICAL EXAM:  Physical Exam Vitals and nursing note reviewed.  Constitutional:      General: She is not in acute distress.    Appearance: Normal appearance. She is not ill-appearing.  HENT:     Head: Normocephalic and atraumatic.     Mouth/Throat:     Mouth: Mucous membranes are moist.     Pharynx: Oropharynx is clear. No oropharyngeal exudate or posterior oropharyngeal erythema.  Eyes:     General: No scleral icterus.    Extraocular Movements: Extraocular movements intact.     Conjunctiva/sclera: Conjunctivae normal.     Pupils: Pupils are equal, round, and reactive to light.  Cardiovascular:     Rate and Rhythm: Normal rate and regular rhythm.     Heart sounds: Normal heart sounds. No murmur heard.    No friction rub. No gallop.  Pulmonary:     Effort: Pulmonary effort is normal.     Breath sounds: Normal breath sounds. No wheezing, rhonchi or rales.  Chest:  Breasts:    Right: Absent.     Left: Absent.     Comments: Bilateral reconstructions are negative Abdominal:     General: There is no distension.     Palpations: Abdomen is soft. There is no mass.     Tenderness: There is no abdominal tenderness.  Musculoskeletal:        General: Normal range of motion.     Cervical back: Normal range of motion  and neck supple. No tenderness.     Right lower leg: No edema.     Left lower leg: No edema.  Lymphadenopathy:     Cervical: No cervical adenopathy.     Upper Body:     Right upper body: No supraclavicular or axillary adenopathy.     Left upper body: No supraclavicular or axillary adenopathy.     Lower Body: No right inguinal adenopathy. No left inguinal adenopathy.  Skin:    General: Skin is warm and dry.     Coloration: Skin is not jaundiced.     Findings: No rash.  Neurological:     Mental Status: She is alert and oriented to person, place, and time.     Cranial Nerves: No cranial nerve deficit.  Psychiatric:        Mood and Affect: Mood normal.        Behavior: Behavior normal.        Thought Content: Thought content normal.     LABS:      Latest Ref Rng & Units 04/28/2021  12:00 AM 10/03/2020   12:00 AM 07/04/2020   11:22 AM  CBC  WBC  5.2  4.2     6.4   Hemoglobin 12.0 - 16.0 13.1  13.3     13.5   Hematocrit 36 - 46 39  39     40.6   Platelets 150 - 399 244  213     267      This result is from an external source.      Latest Ref Rng & Units 07/01/2021   10:13 AM 04/28/2021   12:00 AM 10/03/2020   12:00 AM  CMP  Glucose 70 - 99 mg/dL 295     BUN 6 - 20 mg/dL 10  13  10       Creatinine 0.44 - 1.00 mg/dL 6.21  0.8  0.8      Sodium 135 - 145 mmol/L 137  139  139      Potassium 3.5 - 5.1 mmol/L 3.8  3.7  4.3      Chloride 98 - 111 mmol/L 103  103  108      CO2 22 - 32 mmol/L 25  25  26       Calcium 8.9 - 10.3 mg/dL 8.8  8.8  9.0      Alkaline Phos 25 - 125  49  43      AST 13 - 35  29  18      ALT 7 - 35  15  19         This result is from an external source.       STUDIES:  No results found.    HISTORY:   Past Medical History:  Diagnosis Date   Anxiety    Anxiety    Asthma    Breast cancer (HCC) 2021   DDD (degenerative disc disease)    Migraine    Seizures (HCC)    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   Tobacco abuse 03/02/2022     Past Surgical History:  Procedure Laterality Date   ABDOMINAL HYSTERECTOMY  06/03/2022   BREAST LUMPECTOMY     BREAST RECONSTRUCTION WITH PLACEMENT OF TISSUE EXPANDER AND FLEX HD (ACELLULAR HYDRATED DERMIS) Bilateral 03/25/2021   Procedure: BILATERAL BREAST RECONSTRUCTION WITH PLACEMENT OF TISSUE EXPANDER AND FLEX HD (ACELLULAR HYDRATED DERMIS);  Surgeon: Peggye Form, DO;  Location: [facility];  Service: Plastics;  Laterality: Bilateral;   CESAREAN SECTION     CHOLECYSTECTOMY     ENDOMETRIAL ABLATION     NIPPLE SPARING MASTECTOMY Bilateral 03/25/2021   Procedure: BILATERAL SIMPLE NIPPLE SPARING MASTECTOMY;  Surgeon: Darleene Cleaver, MD;  Location: [facility];  Service: General;  Laterality: Bilateral;   REMOVAL OF BILATERAL TISSUE EXPANDERS WITH PLACEMENT OF BILATERAL BREAST IMPLANTS Bilateral 07/01/2021   Procedure: REMOVAL OF BILATERAL TISSUE EXPANDERS WITH PLACEMENT OF BILATERAL BREAST IMPLANTS;  Surgeon: Peggye Form, DO;  Location: [facility];  Service: Plastics;  Laterality: Bilateral;  2 hours   TUBAL LIGATION      Family History  Problem Relation Age of Onset   Colitis Mother    Colon cancer Paternal Uncle    Diabetes Paternal Grandmother     Social History:  reports that she quit smoking about 2 years ago. Her smoking use included cigarettes. She started smoking about 34 years ago. She has a 49.6 pack-year smoking history. She has quit using smokeless tobacco. She reports that she does not drink  alcohol and does not use drugs.The patient is alone today.  Allergies:  Allergies  Allergen Reactions   Ketorolac Itching   Ketorolac Tromethamine Itching   Sumatriptan Anaphylaxis and Other (See Comments)    Stroke risk  Increased risk for stroke   Meperidine Other (See Comments)    combative  Other reaction(s): Other (See Comments)  Combative    Other reaction(s): Other (See Comments)  combative   Combative  Combative, , Other reaction(s): Other (See Comments), combative, Combative   Meperidine Hcl     Combative   Bactrim [Sulfamethoxazole-Trimethoprim] Hives   Triptans Other (See Comments)    Hemiplegic migraine   Ibuprofen Itching and Rash    Current Medications: Current Outpatient Medications  Medication Sig Dispense Refill   clonazePAM (KLONOPIN) 0.5 MG tablet Take 2 tablets (1 mg total) by mouth at bedtime. 60 tablet 0   conjugated estrogens (PREMARIN) vaginal cream Place 1 Applicatorful vaginally daily. 42.5 g 12   diphenhydramine-acetaminophen (TYLENOL PM) 25-500 MG TABS tablet Take 1 tablet by mouth at bedtime as needed.     fluticasone (FLONASE) 50 MCG/ACT nasal spray Place into the nose.     ibuprofen (ADVIL) 800 MG tablet TAKE 1 TABLET BY MOUTH EVERY 6 HOURS AS NEEDED 60 tablet 0   levalbuterol (XOPENEX HFA) 45 MCG/ACT inhaler SMARTSIG:1 Puff(s) By Mouth 1-4 Times Daily     methocarbamol (ROBAXIN) 500 MG tablet TAKE 1 TABLET BY MOUTH EVERY 6 HOURS AS NEEDED FOR MUSCLE SPASM 60 tablet 5   omeprazole (PRILOSEC) 20 MG capsule Take 1 capsule (20 mg total) by mouth daily. Please call [phone] to schedule an office visit for refills 30 capsule 2   tamoxifen (NOLVADEX) 20 MG tablet Take 1 tablet by mouth once daily 90 tablet 3   venlafaxine (EFFEXOR) 37.5 MG tablet Take 1 tablet (37.5 mg total) by mouth daily. 30 tablet 5   Current Facility-Administered Medications  Medication Dose Route Frequency Provider Last Rate Last Admin   0.9 %  sodium chloride infusion  500 mL Intravenous Once Lynann Bologna, MD         ASSESSMENT & PLAN:   Assessment & Plan: Brianna Merritt is a 47 y.o. female   1.  Stage IA (T1b N0 M0) hormone receptor positive breast cancer April 2021, treated with lumpectomy and adjuvant radiation.  She continues adjuvant hormonal therapy with tamoxifen daily with plans for total of 5 years of therapy. She remains without evidence of recurrence.   2.   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  She was treated with bilateral mastectomies with Dr. Lequita Halt and underwent reconstruction with Dr. Ulice Bold.   3.  CHEK2 heterozygous gene mutation.  This is a high risk gene related to an increased risk of breast cancer with her cancer risk being 20-31% when compared to the risk for the general population of 10.2%.  Her risk for developing a secondary primary within 10 years is up to 29%.  This gene is also associated with a possible elevated risk for colorectal cancer.  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 EGD and colonoscopy in March 2022 was negative.   4.  Seizure disorder; she has been without an episode for about 4 years.   5.  Depression/anxiety.  She is being weaned off clonazepam.  She continues venlafaxine 37.5 mg daily.  6. Hysterectomy and bilateral salpingo-oophorectomy October 2023.   7.  Vaginal dryness for which  she has Premarin cream.   I will see her back in 6 months for reevaluation. The patient understands the plans discussed today and is in agreement with them.  She knows to contact our office if she develops concerns prior to her next appointment.     I provided 15 minutes of face-to-face time during this encounter and > 50% was spent counseling as documented under my assessment and plan.    Adah Perl, PA-C  [facility] [address] [place] [address] Dept: [phone] Dept Fax: [phone]   No orders of the defined types were placed in this encounter.

## 2023-07-18 NOTE — Progress Notes (Signed)
Clonazepam 0.5mg  taking 1/2 tablet at bedtime.  Is to reduced to 1/2 tablet every other night in December 2024.

## 2023-08-08 ENCOUNTER — Other Ambulatory Visit: Payer: Self-pay | Admitting: Oncology

## 2023-08-08 DIAGNOSIS — Z170 Estrogen receptor positive status [ER+]: Secondary | ICD-10-CM

## 2023-08-11 ENCOUNTER — Other Ambulatory Visit: Payer: Self-pay | Admitting: Oncology

## 2023-08-11 DIAGNOSIS — Z170 Estrogen receptor positive status [ER+]: Secondary | ICD-10-CM

## 2023-08-11 NOTE — Telephone Encounter (Signed)
duplicate

## 2023-08-30 ENCOUNTER — Other Ambulatory Visit: Payer: Self-pay | Admitting: Oncology

## 2023-08-30 DIAGNOSIS — Z170 Estrogen receptor positive status [ER+]: Secondary | ICD-10-CM

## 2023-10-10 ENCOUNTER — Other Ambulatory Visit: Payer: Self-pay | Admitting: Oncology

## 2023-10-10 DIAGNOSIS — Z170 Estrogen receptor positive status [ER+]: Secondary | ICD-10-CM

## 2023-11-17 ENCOUNTER — Encounter: Payer: Self-pay | Admitting: Gastroenterology

## 2023-11-22 ENCOUNTER — Other Ambulatory Visit: Payer: Self-pay | Admitting: Oncology

## 2023-11-22 DIAGNOSIS — Z170 Estrogen receptor positive status [ER+]: Secondary | ICD-10-CM

## 2023-12-14 ENCOUNTER — Ambulatory Visit (AMBULATORY_SURGERY_CENTER)

## 2023-12-14 VITALS — Ht 60.0 in | Wt 160.0 lb

## 2023-12-14 DIAGNOSIS — Z800 Family history of malignant neoplasm of digestive organs: Secondary | ICD-10-CM

## 2023-12-14 DIAGNOSIS — Z860100 Personal history of colon polyps, unspecified: Secondary | ICD-10-CM

## 2023-12-14 NOTE — Progress Notes (Signed)

## 2023-12-26 ENCOUNTER — Other Ambulatory Visit: Payer: Self-pay | Admitting: Oncology

## 2023-12-26 DIAGNOSIS — Z170 Estrogen receptor positive status [ER+]: Secondary | ICD-10-CM

## 2024-01-11 ENCOUNTER — Encounter: Payer: Self-pay | Admitting: Gastroenterology

## 2024-01-11 ENCOUNTER — Ambulatory Visit (AMBULATORY_SURGERY_CENTER): Admitting: Gastroenterology

## 2024-01-11 VITALS — BP 140/88 | HR 69 | Temp 97.9°F | Resp 14 | Ht 60.0 in | Wt 160.0 lb

## 2024-01-11 DIAGNOSIS — Z800 Family history of malignant neoplasm of digestive organs: Secondary | ICD-10-CM

## 2024-01-11 DIAGNOSIS — Z1211 Encounter for screening for malignant neoplasm of colon: Secondary | ICD-10-CM

## 2024-01-11 DIAGNOSIS — K621 Rectal polyp: Secondary | ICD-10-CM | POA: Diagnosis not present

## 2024-01-11 DIAGNOSIS — D128 Benign neoplasm of rectum: Secondary | ICD-10-CM

## 2024-01-11 DIAGNOSIS — K640 First degree hemorrhoids: Secondary | ICD-10-CM | POA: Diagnosis not present

## 2024-01-11 DIAGNOSIS — Z860100 Personal history of colon polyps, unspecified: Secondary | ICD-10-CM

## 2024-01-11 MED ORDER — SODIUM CHLORIDE 0.9 % IV SOLN
500.0000 mL | Freq: Once | INTRAVENOUS | Status: AC
Start: 2024-01-11 — End: ?

## 2024-01-11 NOTE — Progress Notes (Signed)
 Report given to PACU, vss

## 2024-01-11 NOTE — Patient Instructions (Signed)

## 2024-01-11 NOTE — Progress Notes (Signed)
 Chief Complaint: for GI eval  Referring Provider:  Elyce Hams, Marguerita Shih, *      ASSESSMENT AND PLAN;   #1. H/O polyps. FH colon cancer (GM)  #2. Personal H/O Breast Ca. 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 No Lynch.  #3. IBS-C  #4. GERD with HH with rare dysphagia (Ramen noodles)   Plan: colon  HPI:    Brianna Merritt is a 48 y.o. female  Sent to GI clinic by Dr. Almer Jacobson for GI evaluation Dx with stage Ia invasive ductal breast cancer in April 2021 s/p lumpectomy/XRT. 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  She had colonoscopy in 2004 which was neg per patient.  C/O longstanding constipation x since childhood, mom had her on mineral oil.  She would have BMs 1/2 weeks, with pellet-like stools, lower abdominal bloating, abdominal discomfort which gets better with defecation.  She would have better bowel movements during menstrual periods.  She has tried multiple medications including MiraLAX , MOM without any significant benefit.  Her husband is on Linzess .  She would like to try that.  No melena or hematochezia.  He does complain of occasional epigastric discomfort, occasional heartburn, and occasional problems with dysphagia mainly to Ramen noodles.   She had EGD done in 2004 which showed hiatal hernia, distal esophagitis and healed gastric ulcer.  She does use occasional nonsteroidals.  She continues to smoke despite medical advice.  Had blood work including CBC, CMP, TSH at cancer center, [place].   Past GI EGD 2004: HH, esophagitis, gastric ulcer Colonoscopy 2004: neg per patient at [facility].  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   Tobacco abuse 03/02/2022    Past Surgical History:  Procedure Laterality Date   ABDOMINAL HYSTERECTOMY  06/03/2022   BREAST LUMPECTOMY     BREAST RECONSTRUCTION WITH PLACEMENT OF TISSUE EXPANDER AND FLEX HD (ACELLULAR HYDRATED DERMIS) Bilateral 03/25/2021   Procedure: BILATERAL BREAST RECONSTRUCTION WITH PLACEMENT OF TISSUE EXPANDER AND FLEX HD (ACELLULAR HYDRATED DERMIS);  Surgeon: Thornell Flirt, DO;  Location: [facility];  Service: Plastics;  Laterality: Bilateral;   CESAREAN SECTION     CHOLECYSTECTOMY     ENDOMETRIAL ABLATION     NIPPLE SPARING MASTECTOMY Bilateral 03/25/2021   Procedure: BILATERAL SIMPLE NIPPLE SPARING MASTECTOMY;  Surgeon: Elzie Handler, MD;  Location: [facility];  Service: General;  Laterality: Bilateral;   REMOVAL OF BILATERAL TISSUE EXPANDERS WITH PLACEMENT OF BILATERAL BREAST IMPLANTS Bilateral 07/01/2021   Procedure: REMOVAL OF BILATERAL TISSUE EXPANDERS WITH PLACEMENT OF BILATERAL BREAST IMPLANTS;  Surgeon: Thornell Flirt, DO;  Location: [facility];  Service: Plastics;  Laterality: Bilateral;  2 hours   TUBAL LIGATION      Family History  Problem Relation Age of Onset   Colitis Mother    Colon polyps Paternal Uncle    Colon cancer Paternal Uncle    Diabetes Paternal Grandmother    Esophageal cancer Neg Hx    Stomach cancer Neg Hx    Rectal cancer Neg Hx     Social History   Tobacco  Use   Smoking status: Former    Current packs/day: 0.00    Average packs/day: 1.5 packs/day for 33.0 years (49.6 ttl pk-yrs)    Types: Cigarettes    Start date: 08/23/1988    Quit date: 11/21/2020    Years since quitting: 3.1   Smokeless tobacco: Former   Tobacco comments:    also vape at times  Vaping Use   Vaping status: Some Days  Substance Use Topics   Alcohol use: No   Drug use: No    Current Outpatient Medications  Medication Sig Dispense Refill   clonazePAM  (KLONOPIN ) 0.5  MG tablet Take 2 tablets (1 mg total) by mouth at bedtime. 60 tablet 0   conjugated estrogens  (PREMARIN ) vaginal cream Place 1 Applicatorful vaginally daily. 42.5 g 12   diphenhydramine -acetaminophen  (TYLENOL  PM) 25-500 MG TABS tablet Take 1 tablet by mouth at bedtime as needed.     fluticasone (FLONASE) 50 MCG/ACT nasal spray Place into the nose.     ibuprofen  (ADVIL ) 800 MG tablet TAKE 1 TABLET BY MOUTH EVERY 6 HOURS AS NEEDED 60 tablet 0   levalbuterol (XOPENEX HFA) 45 MCG/ACT inhaler SMARTSIG:1 Puff(s) By Mouth 1-4 Times Daily     methocarbamol  (ROBAXIN ) 500 MG tablet TAKE 1 TABLET BY MOUTH EVERY 6 HOURS AS NEEDED FOR MUSCLE SPASMS 60 tablet 0   omeprazole  (PRILOSEC) 20 MG capsule Take 1 capsule (20 mg total) by mouth daily. Please call [phone] to schedule an office visit for refills 30 capsule 2   tamoxifen  (NOLVADEX ) 20 MG tablet Take 1 tablet by mouth once daily 90 tablet 3   TRULANCE 3 MG TABS Take 1 tablet by mouth daily.     venlafaxine  (EFFEXOR ) 37.5 MG tablet Take 1 tablet (37.5 mg total) by mouth daily. 30 tablet 5   Venlafaxine  HCl 37.5 MG TB24 Take 1 tablet by mouth daily.     Current Facility-Administered Medications  Medication Dose Route Frequency Provider Last Rate Last Admin   0.9 %  sodium chloride  infusion  500 mL Intravenous Once Lajuan Pila, MD       0.9 %  sodium chloride  infusion  500 mL Intravenous Once Lajuan Pila, MD        Allergies  Allergen Reactions   Ketorolac  Itching   Ketorolac  Tromethamine  Itching   Sumatriptan Anaphylaxis and Other (See Comments)    Stroke risk  Increased risk for stroke   Triptans Other (See Comments)    Hemiplegic migraine   Bactrim [Sulfamethoxazole-Trimethoprim] Hives   Meperidine Other (See Comments)    combative  Other reaction(s): Other (See Comments)  Combative    Other reaction(s): Other (See Comments)  combative  Combative  Combative, , Other reaction(s): Other (See Comments), combative, Combative    Meperidine Hcl     Combative   Soy Allergy (Obsolete) Other (See Comments)    Oncologist told patient not to take anything with soy in it.    Review of Systems:  Constitutional: Denies fever, chills, diaphoresis, appetite change and fatigue.  HEENT: Denies photophobia, eye pain, redness, hearing loss, ear pain, congestion, sore throat, rhinorrhea, sneezing, mouth sores, neck pain, neck stiffness and tinnitus.   Respiratory: Denies SOB, DOE, cough, chest tightness,  and wheezing.   Cardiovascular: Denies chest pain, palpitations and leg swelling.  Genitourinary: Denies dysuria, urgency, frequency, hematuria, flank pain and difficulty urinating.  Musculoskeletal: Denies myalgias, back pain, joint swelling, arthralgias and gait problem.  Skin: No rash.  Neurological: Denies dizziness, seizures, syncope, weakness, light-headedness, numbness and  headaches.  Hematological: Denies adenopathy. Easy bruising, personal or family bleeding history  Psychiatric/Behavioral: No anxiety or depression     Physical Exam:    BP 112/62   Pulse 64   Temp 97.9 F (36.6 C)   Ht 5' (1.524 m)   Wt 160 lb (72.6 kg)   LMP 12/21/2020 (Approximate) Comment: Urine pregnancy test negative  SpO2 100%   BMI 31.25 kg/m  Wt Readings from Last 3 Encounters:  01/11/24 160 lb (72.6 kg)  12/14/23 160 lb (72.6 kg)  07/18/23 143 lb 14.4 oz (65.3 kg)   Constitutional:  Well-developed, in no acute distress. Psychiatric: Normal mood and affect. Behavior is normal. HEENT: Pupils normal.  Conjunctivae are normal. No scleral icterus. Neck supple.  Cardiovascular: Normal rate, regular rhythm. No edema Pulmonary/chest: Effort normal and breath sounds normal. No wheezing, rales or rhonchi. Abdominal: Soft, nondistended. Nontender. Bowel sounds active throughout. There are no masses palpable. No hepatomegaly. Rectal: Deferred Neurological: Alert and oriented to person place and time. Skin: Skin is warm and dry. No rashes  noted.  Data Reviewed: I have personally reviewed following labs and imaging studies  CBC:    Latest Ref Rng & Units 04/28/2021   12:00 AM 10/03/2020   12:00 AM 07/04/2020   11:22 AM  CBC  WBC  5.2  4.2     6.4   Hemoglobin 12.0 - 16.0 13.1  13.3     13.5   Hematocrit 36 - 46 39  39     40.6   Platelets 150 - 399 244  213     267      This result is from an external source.    CMP:    Latest Ref Rng & Units 07/01/2021   10:13 AM 04/28/2021   12:00 AM 10/03/2020   12:00 AM  CMP  Glucose 70 - 99 mg/dL 540     BUN 6 - 20 mg/dL 10  13  10       Creatinine 0.44 - 1.00 mg/dL 9.81  0.8  0.8      Sodium 135 - 145 mmol/L 137  139  139      Potassium 3.5 - 5.1 mmol/L 3.8  3.7  4.3      Chloride 98 - 111 mmol/L 103  103  108      CO2 22 - 32 mmol/L 25  25  26       Calcium 8.9 - 10.3 mg/dL 8.8  8.8  9.0      Alkaline Phos 25 - 125  49  43      AST 13 - 35  29  18      ALT 7 - 35  15  19         This result is from an external source.        Magnus Schuller, MD 01/11/2024, 11:08 AM  Cc: Elyce Hams, Marguerita Shih, *

## 2024-01-11 NOTE — Op Note (Signed)
 [facility] Patient Name: Brianna Merritt Procedure Date: 01/11/2024 11:08 AM MRN: [id number] Endoscopist: Lajuan Pila , MD, [id number] Age: 48 Referring MD:  Date of Birth: [birth year] Gender: Female Account #: [id number] Procedure:                Colonoscopy Indications:              #1. H/O polyps 10/2020. FH colon cancer (GM)                           #2. Personal H/O Breast Ca. CHEK+, VUS (variant of                            uncertain significance) identified in MSH2 gene. No                            Lynch.                           #3. IBS-C. Medicines:                Monitored Anesthesia Care Procedure:                Pre-Anesthesia Assessment:                           - Prior to the procedure, a History and Physical                            was performed, and patient medications and                            allergies were reviewed. The patient's tolerance of                            previous anesthesia was also reviewed. The risks                            and benefits of the procedure and the sedation                            options and risks were discussed with the patient.                            All questions were answered, and informed consent                            was obtained. Prior Anticoagulants: The patient has                            taken no anticoagulant or antiplatelet agents. ASA                            Grade Assessment: II - A patient with mild systemic  disease. After reviewing the risks and benefits,                            the patient was deemed in satisfactory condition to                            undergo the procedure.                           After obtaining informed consent, the colonoscope                            was passed under direct vision. Throughout the                            procedure, the patient's blood pressure, pulse, and                            oxygen  saturations were monitored continuously. The                            [id number]-HQ190L Colonoscope [id number] was introduced                            through the anus and advanced to the the cecum,                            identified by appendiceal orifice and ileocecal                            valve. The colonoscopy was somewhat difficult due                            to fair bowel prep. Successful completion of the                            procedure was aided by lavage. The patient                            tolerated the procedure well. The quality of the                            bowel preparation was adequate to identify polyps                            greater than 5 mm in size. There was retained solid                            stool in solid vegetable material in few areas of                            the colon which will clog the suction channel of  the scope. Nevertheless, aggressive suctioning                            aspiration was performed. Overall over 85 to 90% of                            the colonic mucosa was visualized satisfactorily.                            The ileocecal valve, appendiceal orifice, and                            rectum were photographed. Scope In: 11:17:46 AM Scope Out: 11:40:39 AM Scope Withdrawal Time: 0 hours 12 minutes 43 seconds  Total Procedure Duration: 0 hours 22 minutes 53 seconds  Findings:                 A 4 mm polyp was found in the rectum. The polyp was                            sessile. The polyp was removed with a cold biopsy                            forceps. Resection and retrieval were complete.                           Non-bleeding internal hemorrhoids were found during                            retroflexion. The hemorrhoids were small and Grade                            I (internal hemorrhoids that do not prolapse).                           The exam was otherwise without abnormality on                             direct and retroflexion views. Complications:            No immediate complications. Estimated Blood Loss:     Estimated blood loss: none. Impression:               - One 4 mm polyp in the rectum, removed with a cold                            biopsy forceps. Resected and retrieved.                           - Non-bleeding internal hemorrhoids.                           - The examination was otherwise normal on direct  and retroflexion views. Fair prep. Recommendation:           - Patient has a contact number available for                            emergencies. The signs and symptoms of potential                            delayed complications were discussed with the                            patient. Return to normal activities tomorrow.                            Written discharge instructions were provided to the                            patient.                           - Resume previous diet.                           - Continue present medications.                           - Await pathology results.                           - Repeat colonoscopy for surveillance based on                            pathology results. Likely in 3-5 years with 2-day                            prep                           - The findings and recommendations were discussed                            with the patient's family. Lajuan Pila, MD 01/11/2024 11:46:57 AM This report has been signed electronically.

## 2024-01-11 NOTE — Progress Notes (Signed)
 Called to room to assist during endoscopic procedure.  Patient ID and intended procedure confirmed with present staff. Received instructions for my participation in the procedure from the performing physician.

## 2024-01-11 NOTE — Progress Notes (Signed)
 Pt's states no medical or surgical changes since previsit or office visit.

## 2024-01-12 ENCOUNTER — Telehealth: Payer: Self-pay

## 2024-01-12 NOTE — Telephone Encounter (Signed)
  Follow up Call-     01/11/2024   11:02 AM  Call back number  Post procedure Call Back phone  # [phone]  Permission to leave phone message Yes     Patient questions:  Do you have a fever, pain , or abdominal swelling? No. Pain Score  0 *  Have you tolerated food without any problems? Yes.    Have you been able to return to your normal activities? Yes.    Do you have any questions about your discharge instructions: Diet   No. Medications  No. Follow up visit  No.  Do you have questions or concerns about your Care? No.  Actions: * If pain score is 4 or above: No action needed, pain <4.

## 2024-01-12 NOTE — Progress Notes (Incomplete)
 [facility]  [address] [place],  [place]  [address] [phone]  Clinic Day:  01/12/2024  Referring physician: Elyce Hams, Irma M, *   CHIEF COMPLAINT:  CC: Stage IA hormone receptor positive breast cancer  Current Treatment:  Tamoxifen  20 mg daily  HISTORY OF PRESENT ILLNESS:  Brianna Merritt is a 48 y.o. 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  Annual mammography in March revealed possible asymmetries within the bilateral breasts. Bilateral diagnostic mammogram revealed a heterogeneous hypoechoic irregular mass with microlobulated and angular margins at the 10 o'clock position 6 cm from the nipple measuring 7 x 9 x 6 mm in the right breast.  Ultrasound of the right axilla was negative.  No evidence of malignancy was seen in the left breast.  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  The invasive carcinoma involved all cores measuring 8 mm in maximal extent.  Estrogen receptor was 95% positive and progesterone receptor was 100% positive.  HER2 was equivocal (2+), negative by FISH.     Due to her personal and family history of breast cancer she underwent preoperative genetic testing with the [employer] myRisk Hereditary Cancer Panel.  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  This particular mutation is associated with an increased lifetime risk of breast cancer of 20 to 31%, with the risk of a 2nd breast cancer primary within 10 years of her 1st breast cancer up to 29%.  There is insufficient evidence to recommend prophylactic mastectomy with a CHEK2 mutation. There is also possibly an elevated risk of colorectal cancer associated with a CHEK2 mutation, which cannot be quantified. There was also a variant of  uncertain significance (VUS) found in the MSH2.   She was treated with right lumpectomy and sentinel lymph node biopsy in April 2021.  Final pathology revealed an 8 mm, grade 2,  invasive ductal carcinoma with ductal carcinoma in situ, intermediate nuclear grade.  Margins were free of neoplasm.  One sentinel lymph node was negative for metastatic carcinoma (0/1).  She was premenopausal, and had irregular menses, so was taking Lo Loestrin  1 mg/10 mcg for about 5 months prior to her diagnosis.  This was discontinued following her diagnosis.  EndoPredict revealed a 12 gene molecular score of 7.2 with an EPclin score of 3.0, which is considered low risk.  Her risk for distant recurrence within 10 years is 7.4% with adjuvant radiation and endocrine therapy alone, with an absolute chemotherapy benefit of 1.9%.  Her risk for distant recurrence in 5-15 years is 5.8%.  Adjuvant chemotherapy was not recommended.  She completed adjuvant radiation to the right breast in July. She was placed on adjuvant hormonal therapy with tamoxifen  20 mg daily in September 2021. She was on duloxetine  at that time and due to the interaction with tamoxifen , she was switched to venlafaxine , but later stopped it.   Colonoscopy and EGD were done in 2004.  Colonoscopy was unremarkable.  EGD revealed hiatal hernia, mild distal esophagitis and healed gastric ulcer.  We recommended repeat colonoscopy due to the CHEK2 mutation. EGD and colonoscopy in March 2022 did not reveal any significant abnormality.  She did have removal of 2 polyps, 1 of which was a villous adenoma, and the other a hyperplastic polyp.  She had an abnormal mammogram in March 2022 that revealed calcifications within the bilateral breasts. On MRI breast these areas were suspicious.  There was no corresponding mass on ultrasound.   MRI guided biopsy of the right breast upper inner quadrant and left breast upper inner quadrant revealed fibroadenomatoid changes and fibrocystic  with usual ductal hyperplasia, fibroadenomatoid changes respectively.  Stereotactic biopsy of 2 areas the right lower outer quadrant was also recommended.  Pathology of the posterior depth revealed diminutive complex sclerosing lesion with calcifications. Pathology of the anterior depth revealed benign glands with calcifications. She was scheduled for a stereotactic biopsy of 2 areas of the left breast upper outer quadrant, but she saw Dr. Britta Candy.  Due to the need for multiple biopsies and increased risk for breast cancer with her CHEK2 mutation, they decided on bilateral mastectomies and immediate breast reconstruction which was done in August 2022.  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  Margins uninvolved by carcinoma.  Complex sclerosing lesion, fibrocystic changes with apocrine metaplasia and adenosis and calcifications.  Right breast revealed fibroadenomatoid and fibrocystic changes with calcifications.  Previous surgical site changes.  No malignancy identified.    She underwent hysterectomy/BSO in 2023.  Oncology History  Malignant neoplasm of upper-outer quadrant of right female breast (HCC)  12/06/2019 Initial Diagnosis   Breast cancer, right (HCC)   12/06/2019 Cancer Staging   Staging form: Breast, AJCC 8th Edition - Clinical stage from 12/06/2019: Stage IA (cT1b, cN0(sn), cM0, G2, ER+, PR+, HER2-) - Signed by Nolia Baumgartner, MD on 07/07/2020   Ductal carcinoma in situ of left breast  03/31/2021 Cancer Staging   Staging form: Breast, AJCC 8th Edition - Clinical stage from 03/31/2021: Stage 0 (cTis (DCIS), cN0, cM0, G2, ER+, PR+, HER2: Not Assessed) - Signed by Nolia Baumgartner, MD on 05/01/2021 Histopathologic type: Intraductal carcinoma, noninfiltrating, NOS Stage prefix: Initial diagnosis Method of lymph node assessment: Clinical Nuclear grade: G2 Multigene prognostic tests performed: None Histologic  grading system: 3 grade system Laterality: Left Tumor size (mm): 3 Lymph-vascular invasion (LVI): LVI not present (absent)/not identified Diagnostic confirmation: Positive histology Specimen type: Excision Staged by: Managing physician Menopausal status: Premenopausal Prognostic indicators: Incidental finding at bilat mast for CHEK 2 mutation, prior invasive breast cancer in the contralateral breast Stage used in treatment planning: Yes National guidelines used in treatment planning: Yes Type of national guideline used in treatment planning: NCCN   05/01/2021 Initial Diagnosis   Ductal carcinoma in situ of left breast       INTERVAL HISTORY:  Brianna Merritt is here today for repeat clinical assessment for her stage IA hormone receptor positive breast cancer. Patient states that she feels *** and ***.       She denies fever, chills, night sweats, or other signs of infection. She denies cardiorespiratory and gastrointestinal issues. She  denies pain. Her appetite is *** and Her weight {Weight change:10426}.   She states she continues tamoxifen  daily without significant difficulty. She has had hot flashes, which have lessened with time.  She has occasional cramping, shooting pain in the right chest wall. She otherwise denies changes in her reconstructions.  She denies clear nasal drainage and sinus congestion which she attributes to seasonal allergies.  She denies fevers or chills. She denies pain elsewhere. Her appetite is good. Her weight has increased 4 pounds over last 6 months.  REVIEW OF SYSTEMS:  Review of Systems  Constitutional: Negative.  Negative for appetite  change, chills, diaphoresis, fatigue, fever and unexpected weight change.  HENT:  Negative.  Negative for hearing loss, lump/mass, mouth sores, nosebleeds, sore throat, tinnitus, trouble swallowing and voice change.   Eyes: Negative.   Respiratory: Negative.  Negative for chest tightness, cough, hemoptysis, shortness of breath  and wheezing.   Cardiovascular: Negative.  Negative for chest pain, leg swelling and palpitations.  Gastrointestinal: Negative.  Negative for abdominal distention, abdominal pain, blood in stool, constipation, diarrhea, nausea, rectal pain and vomiting.  Endocrine: Positive for hot flashes.  Genitourinary: Negative.  Negative for difficulty urinating, dysuria, frequency, hematuria and vaginal bleeding.   Musculoskeletal: Negative.  Negative for arthralgias, back pain, flank pain, gait problem, myalgias, neck pain and neck stiffness.  Skin: Negative.  Negative for itching, rash and wound.  Neurological:  Negative for dizziness, extremity weakness, gait problem, headaches, light-headedness, numbness, seizures and speech difficulty.  Hematological: Negative.  Negative for adenopathy. Does not bruise/bleed easily.  Psychiatric/Behavioral: Negative.  Negative for confusion, decreased concentration, depression, sleep disturbance and suicidal ideas. The patient is not nervous/anxious.      VITALS:  Last menstrual period 12/21/2020.  Wt Readings from Last 3 Encounters:  01/11/24 160 lb (72.6 kg)  12/14/23 160 lb (72.6 kg)  07/18/23 143 lb 14.4 oz (65.3 kg)    There is no height or weight on file to calculate BMI.  Performance status (ECOG): 0 - Asymptomatic  PHYSICAL EXAM:  Physical Exam Vitals and nursing note reviewed.  Constitutional:      General: She is not in acute distress.    Appearance: Normal appearance. She is not ill-appearing.  HENT:     Head: Normocephalic and atraumatic.     Mouth/Throat:     Mouth: Mucous membranes are moist.     Pharynx: Oropharynx is clear. No oropharyngeal exudate or posterior oropharyngeal erythema.  Eyes:     General: No scleral icterus.    Extraocular Movements: Extraocular movements intact.     Conjunctiva/sclera: Conjunctivae normal.     Pupils: Pupils are equal, round, and reactive to light.  Cardiovascular:     Rate and Rhythm: Normal rate  and regular rhythm.     Heart sounds: Normal heart sounds. No murmur heard.    No friction rub. No gallop.  Pulmonary:     Effort: Pulmonary effort is normal.     Breath sounds: Normal breath sounds. No wheezing, rhonchi or rales.  Chest:  Breasts:    Right: Absent.     Left: Absent.     Comments: Bilateral reconstructions are negative Abdominal:     General: There is no distension.     Palpations: Abdomen is soft. There is no mass.     Tenderness: There is no abdominal tenderness.  Musculoskeletal:        General: Normal range of motion.     Cervical back: Normal range of motion and neck supple. No tenderness.     Right lower leg: No edema.     Left lower leg: No edema.  Lymphadenopathy:     Cervical: No cervical adenopathy.     Upper Body:     Right upper body: No supraclavicular or axillary adenopathy.     Left upper body: No supraclavicular or axillary adenopathy.     Lower Body: No right inguinal adenopathy. No left inguinal adenopathy.  Skin:    General: Skin is warm and dry.     Coloration: Skin is not jaundiced.     Findings: No rash.  Neurological:  Mental Status: She is alert and oriented to person, place, and time.     Cranial Nerves: No cranial nerve deficit.  Psychiatric:        Mood and Affect: Mood normal.        Behavior: Behavior normal.        Thought Content: Thought content normal.     LABS:      Latest Ref Rng & Units 04/28/2021   12:00 AM 10/03/2020   12:00 AM 07/04/2020   11:22 AM  CBC  WBC  5.2  4.2     6.4   Hemoglobin 12.0 - 16.0 13.1  13.3     13.5   Hematocrit 36 - 46 39  39     40.6   Platelets 150 - 399 244  213     267      This result is from an external source.      Latest Ref Rng & Units 07/01/2021   10:13 AM 04/28/2021   12:00 AM 10/03/2020   12:00 AM  CMP  Glucose 70 - 99 mg/dL 161     BUN 6 - 20 mg/dL 10  13  10       Creatinine 0.44 - 1.00 mg/dL 0.96  0.8  0.8      Sodium 135 - 145 mmol/L 137  139  139      Potassium  3.5 - 5.1 mmol/L 3.8  3.7  4.3      Chloride 98 - 111 mmol/L 103  103  108      CO2 22 - 32 mmol/L 25  25  26       Calcium 8.9 - 10.3 mg/dL 8.8  8.8  9.0      Alkaline Phos 25 - 125  49  43      AST 13 - 35  29  18      ALT 7 - 35  15  19         This result is from an external source.   STUDIES:  No results found.    HISTORY:   Past Medical History:  Diagnosis Date   Anxiety    Anxiety    Asthma    Breast cancer (HCC) 2021   DDD (degenerative disc disease)    GERD (gastroesophageal reflux disease)    Migraine    Seizures (HCC)    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   Tobacco abuse 03/02/2022    Past Surgical History:  Procedure Laterality Date   ABDOMINAL HYSTERECTOMY  06/03/2022   BREAST LUMPECTOMY     BREAST RECONSTRUCTION WITH PLACEMENT OF TISSUE EXPANDER AND FLEX HD (ACELLULAR HYDRATED DERMIS) Bilateral 03/25/2021   Procedure: BILATERAL BREAST RECONSTRUCTION WITH PLACEMENT OF TISSUE EXPANDER AND FLEX HD (ACELLULAR HYDRATED DERMIS);  Surgeon: Thornell Flirt, DO;  Location: [facility];  Service: Plastics;  Laterality: Bilateral;   CESAREAN SECTION     CHOLECYSTECTOMY     ENDOMETRIAL ABLATION     NIPPLE SPARING MASTECTOMY Bilateral 03/25/2021   Procedure: BILATERAL SIMPLE NIPPLE SPARING MASTECTOMY;  Surgeon: Elzie Handler, MD;  Location:  [facility];  Service: General;  Laterality: Bilateral;   REMOVAL OF BILATERAL TISSUE EXPANDERS WITH PLACEMENT OF BILATERAL BREAST IMPLANTS Bilateral 07/01/2021   Procedure: REMOVAL OF BILATERAL TISSUE EXPANDERS WITH PLACEMENT OF BILATERAL BREAST IMPLANTS;  Surgeon: Thornell Flirt, DO;  Location: [facility];  Service: Plastics;  Laterality: Bilateral;  2 hours   TUBAL  LIGATION      Family History  Problem Relation Age of Onset   Colitis Mother    Colon polyps Paternal Uncle    Colon cancer Paternal Uncle    Diabetes Paternal Grandmother    Esophageal cancer Neg Hx     Stomach cancer Neg Hx    Rectal cancer Neg Hx     Social History:  reports that she quit smoking about 3 years ago. Her smoking use included cigarettes. She started smoking about 35 years ago. She has a 49.6 pack-year smoking history. She has quit using smokeless tobacco. She reports that she does not drink alcohol and does not use drugs.The patient is alone today.  Allergies:  Allergies  Allergen Reactions   Ketorolac  Itching   Ketorolac  Tromethamine  Itching   Sumatriptan Anaphylaxis and Other (See Comments)    Stroke risk  Increased risk for stroke   Triptans Other (See Comments)    Hemiplegic migraine   Bactrim [Sulfamethoxazole-Trimethoprim] Hives   Meperidine Other (See Comments)    combative  Other reaction(s): Other (See Comments)  Combative    Other reaction(s): Other (See Comments)  combative  Combative  Combative, , Other reaction(s): Other (See Comments), combative, Combative   Meperidine Hcl     Combative   Soy Allergy (Obsolete) Other (See Comments)    Oncologist told patient not to take anything with soy in it.    Current Medications: Current Outpatient Medications  Medication Sig Dispense Refill   clonazePAM  (KLONOPIN ) 0.5 MG tablet Take 2 tablets (1 mg total) by mouth at bedtime. 60 tablet 0   conjugated estrogens  (PREMARIN ) vaginal cream Place 1 Applicatorful vaginally daily. 42.5 g 12   diphenhydramine -acetaminophen  (TYLENOL  PM) 25-500 MG TABS tablet Take 1 tablet by mouth at bedtime as needed.     fluticasone (FLONASE) 50 MCG/ACT nasal spray Place into the nose.     ibuprofen  (ADVIL ) 800 MG tablet TAKE 1 TABLET BY MOUTH EVERY 6 HOURS AS NEEDED 60 tablet 0   levalbuterol (XOPENEX HFA) 45 MCG/ACT inhaler SMARTSIG:1 Puff(s) By Mouth 1-4 Times Daily     methocarbamol  (ROBAXIN ) 500 MG tablet TAKE 1 TABLET BY MOUTH EVERY 6 HOURS AS NEEDED FOR MUSCLE SPASMS 60 tablet 0   omeprazole  (PRILOSEC) 20 MG capsule Take 1 capsule (20 mg total) by mouth daily.  Please call [phone] to schedule an office visit for refills 30 capsule 2   tamoxifen  (NOLVADEX ) 20 MG tablet Take 1 tablet by mouth once daily 90 tablet 3   TRULANCE 3 MG TABS Take 1 tablet by mouth daily.     venlafaxine  (EFFEXOR ) 37.5 MG tablet Take 1 tablet (37.5 mg total) by mouth daily. 30 tablet 5   Current Facility-Administered Medications  Medication Dose Route Frequency Provider Last Rate Last Admin   0.9 %  sodium chloride  infusion  500 mL Intravenous Once Lajuan Pila, MD         ASSESSMENT & PLAN:  Assessment: 1.  Stage IA (T1b N0 M0) hormone receptor positive breast cancer April 2021, treated with lumpectomy and adjuvant radiation.  She continues adjuvant hormonal therapy with tamoxifen  daily with plans for total of 5 years of therapy. She remains without evidence of recurrence.   2.  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  She was treated with bilateral mastectomies with Dr. Britta Candy and underwent reconstruction with Dr. Orin Birk.   3.  CHEK2 heterozygous gene mutation.  This is a high risk gene related to an increased  risk of breast cancer with her cancer risk being 20-31% when compared to the risk for the general population of 10.2%.  Her risk for developing a secondary primary within 10 years is up to 29%.  This gene is also associated with a possible elevated risk for colorectal cancer.  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 EGD and colonoscopy in March 2022 was negative.   4.  Seizure disorder; she has been without an episode for about 4 years.   5.  Depression/anxiety.  She is being weaned off clonazepam .  She continues venlafaxine  37.5 mg daily.  6. Hysterectomy and bilateral salpingo-oophorectomy October 2023.   7.  Vaginal dryness for which she has Premarin  cream.   Plan:   I will see her back in 6 months for reevaluation. The patient understands the plans discussed  today and is in agreement with them.  She knows to contact our office if she develops concerns prior to her next appointment.    I provided 15 minutes of face-to-face time during this encounter and > 50% was spent counseling as documented under my assessment and plan.   Nolia Baumgartner, MD  [facility] A DEPT OF [facility] [address] [place] [address] Dept: [phone] Dept Fax: [phone]   No orders of the defined types were placed in this encounter.    I,Jasmine M Lassiter,acting as a scribe for Nolia Baumgartner, MD.,have documented all relevant documentation on the behalf of Nolia Baumgartner, MD,as directed by  Nolia Baumgartner, MD while in the presence of Nolia Baumgartner, MD.

## 2024-01-13 LAB — SURGICAL PATHOLOGY

## 2024-01-14 ENCOUNTER — Ambulatory Visit: Payer: Self-pay | Admitting: Gastroenterology

## 2024-01-17 ENCOUNTER — Inpatient Hospital Stay: Admitting: Oncology

## 2024-01-17 ENCOUNTER — Ambulatory Visit: Payer: Medicare Other | Admitting: Oncology

## 2024-01-30 ENCOUNTER — Other Ambulatory Visit: Payer: Self-pay | Admitting: Oncology

## 2024-01-30 DIAGNOSIS — C50411 Malignant neoplasm of upper-outer quadrant of right female breast: Secondary | ICD-10-CM

## 2024-02-02 ENCOUNTER — Inpatient Hospital Stay: Admitting: Oncology

## 2024-02-06 ENCOUNTER — Other Ambulatory Visit: Payer: Self-pay

## 2024-02-09 ENCOUNTER — Inpatient Hospital Stay: Attending: Oncology | Admitting: Oncology

## 2024-02-09 ENCOUNTER — Encounter: Payer: Self-pay | Admitting: Oncology

## 2024-02-09 ENCOUNTER — Other Ambulatory Visit: Payer: Self-pay | Admitting: Oncology

## 2024-02-09 VITALS — BP 103/72 | HR 68 | Temp 98.1°F | Resp 16 | Ht 60.0 in | Wt 159.1 lb

## 2024-02-09 DIAGNOSIS — C50919 Malignant neoplasm of unspecified site of unspecified female breast: Secondary | ICD-10-CM

## 2024-02-09 DIAGNOSIS — Z79810 Long term (current) use of selective estrogen receptor modulators (SERMs): Secondary | ICD-10-CM | POA: Insufficient documentation

## 2024-02-09 DIAGNOSIS — Z803 Family history of malignant neoplasm of breast: Secondary | ICD-10-CM | POA: Insufficient documentation

## 2024-02-09 DIAGNOSIS — K449 Diaphragmatic hernia without obstruction or gangrene: Secondary | ICD-10-CM | POA: Diagnosis not present

## 2024-02-09 DIAGNOSIS — Z923 Personal history of irradiation: Secondary | ICD-10-CM | POA: Insufficient documentation

## 2024-02-09 DIAGNOSIS — N6011 Diffuse cystic mastopathy of right breast: Secondary | ICD-10-CM | POA: Insufficient documentation

## 2024-02-09 DIAGNOSIS — Z860100 Personal history of colon polyps, unspecified: Secondary | ICD-10-CM | POA: Diagnosis not present

## 2024-02-09 DIAGNOSIS — G40909 Epilepsy, unspecified, not intractable, without status epilepticus: Secondary | ICD-10-CM | POA: Diagnosis not present

## 2024-02-09 DIAGNOSIS — Z90710 Acquired absence of both cervix and uterus: Secondary | ICD-10-CM | POA: Diagnosis not present

## 2024-02-09 DIAGNOSIS — N6022 Fibroadenosis of left breast: Secondary | ICD-10-CM | POA: Diagnosis not present

## 2024-02-09 DIAGNOSIS — Z1501 Genetic susceptibility to malignant neoplasm of breast: Secondary | ICD-10-CM | POA: Diagnosis not present

## 2024-02-09 DIAGNOSIS — Z1589 Genetic susceptibility to other disease: Secondary | ICD-10-CM

## 2024-02-09 DIAGNOSIS — Z170 Estrogen receptor positive status [ER+]: Secondary | ICD-10-CM | POA: Diagnosis not present

## 2024-02-09 DIAGNOSIS — Z90722 Acquired absence of ovaries, bilateral: Secondary | ICD-10-CM | POA: Diagnosis not present

## 2024-02-09 DIAGNOSIS — C50411 Malignant neoplasm of upper-outer quadrant of right female breast: Secondary | ICD-10-CM | POA: Insufficient documentation

## 2024-02-09 DIAGNOSIS — Z1502 Genetic susceptibility to malignant neoplasm of ovary: Secondary | ICD-10-CM | POA: Diagnosis not present

## 2024-02-09 DIAGNOSIS — Z1509 Genetic susceptibility to other malignant neoplasm: Secondary | ICD-10-CM

## 2024-02-09 DIAGNOSIS — N6082 Other benign mammary dysplasias of left breast: Secondary | ICD-10-CM | POA: Insufficient documentation

## 2024-02-09 DIAGNOSIS — Z87891 Personal history of nicotine dependence: Secondary | ICD-10-CM | POA: Insufficient documentation

## 2024-02-10 ENCOUNTER — Telehealth: Payer: Self-pay | Admitting: Oncology

## 2024-02-10 NOTE — Telephone Encounter (Signed)
 Patient has been scheduled for follow-up visit per 02/09/24 LOS.  Pt aware of scheduled appt details.

## 2024-02-19 NOTE — Progress Notes (Unsigned)
 [facility]  [address] [place],  [place]  [address] [phone]  Clinic Day:  02/09/24  Referring physician: Melonie Colonel, Irma M, *   CHIEF COMPLAINT:  CC: Stage IA hormone receptor positive breast cancer  Current Treatment:  Tamoxifen  20 mg daily  HISTORY OF PRESENT ILLNESS:  Brianna Merritt is a 48 y.o. 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  Annual mammography in March revealed possible asymmetries within the bilateral breasts. Bilateral diagnostic mammogram revealed a heterogeneous hypoechoic irregular mass with microlobulated and angular margins at the 10 o'clock position 6 cm from the nipple measuring 7 x 9 x 6 mm in the right breast.  Ultrasound of the right axilla was negative.  No evidence of malignancy was seen in the left breast.  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  The invasive carcinoma involved all cores measuring 8 mm in maximal extent.  Estrogen receptor was 95% positive and progesterone receptor was 100% positive.  HER2 was equivocal (2+), negative by FISH.     Due to her personal and family history of breast cancer she underwent preoperative genetic testing with the [employer] myRisk Hereditary Cancer Panel.  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  This particular mutation is associated with an increased lifetime risk of breast cancer of 20 to 31%, with the risk of a 2nd breast cancer primary within 10 years of her 1st breast cancer up to 29%.  There is insufficient evidence to recommend prophylactic mastectomy with a CHEK2 mutation. There is also possibly an elevated risk of colorectal cancer associated with a CHEK2 mutation, which cannot be quantified. There was also a  variant of uncertain significance (VUS) found in the MSH2.   She was treated with right lumpectomy and sentinel lymph node biopsy in April 2021.  Final pathology revealed an 8 mm, grade 2,  invasive ductal carcinoma with ductal carcinoma in situ, intermediate nuclear grade.  Margins were free of neoplasm.  One sentinel lymph node was negative for metastatic carcinoma (0/1).  She was premenopausal, and had irregular menses, so was taking Lo Loestrin  1 mg/10 mcg for about 5 months prior to her diagnosis.  This was discontinued following her diagnosis.  EndoPredict revealed a 12 gene molecular score of 7.2 with an EPclin score of 3.0, which is considered low risk.  Her risk for distant recurrence within 10 years is 7.4% with adjuvant radiation and endocrine therapy alone, with an absolute chemotherapy benefit of 1.9%.  Her risk for distant recurrence in 5-15 years is 5.8%.  Adjuvant chemotherapy was not recommended.  She completed adjuvant radiation to the right breast in July. She was placed on adjuvant hormonal therapy with tamoxifen  20 mg daily in September 2021. She was on duloxetine  at that time and due to the interaction with tamoxifen , she was switched to venlafaxine , but later stopped it.   Colonoscopy and EGD were done in 2004.  Colonoscopy was unremarkable.  EGD revealed hiatal hernia, mild distal esophagitis and healed gastric ulcer.  We recommended repeat colonoscopy due to the CHEK2 mutation. EGD and colonoscopy in March 2022 did not reveal any significant abnormality.  She did have removal of 2 polyps, 1 of which was a villous adenoma, and the other a hyperplastic polyp.  She had an abnormal mammogram in March 2022 that revealed calcifications within the bilateral breasts. On MRI breast these areas were suspicious.  There was no corresponding mass on ultrasound.   MRI guided biopsy of the right breast upper inner quadrant and left breast upper inner quadrant revealed fibroadenomatoid changes and  fibrocystic with usual ductal hyperplasia, fibroadenomatoid changes respectively.  Stereotactic biopsy of 2 areas the right lower outer quadrant was also recommended.  Pathology of the posterior depth revealed diminutive complex sclerosing lesion with calcifications. Pathology of the anterior depth revealed benign glands with calcifications. She was scheduled for a stereotactic biopsy of 2 areas of the left breast upper outer quadrant, but she saw Dr. Joesph.  Due to the need for multiple biopsies and increased risk for breast cancer with her CHEK2 mutation, they decided on bilateral mastectomies and immediate breast reconstruction which was done in August 2022.  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  Margins uninvolved by carcinoma.  Complex sclerosing lesion, fibrocystic changes with apocrine metaplasia and adenosis and calcifications.  Right breast revealed fibroadenomatoid and fibrocystic changes with calcifications.  Previous surgical site changes.  No malignancy identified.    She underwent hysterectomy/BSO in 2023.  Oncology History  Malignant neoplasm of upper-outer quadrant of right female breast (HCC)  12/06/2019 Initial Diagnosis   Breast cancer, right (HCC)   12/06/2019 Cancer Staging   Staging form: Breast, AJCC 8th Edition - Clinical stage from 12/06/2019: Stage IA (cT1b, cN0(sn), cM0, G2, ER+, PR+, HER2-) - Signed by Cornelius Wanda DEL, MD on 07/07/2020   Ductal carcinoma in situ of left breast  03/31/2021 Cancer Staging   Staging form: Breast, AJCC 8th Edition - Clinical stage from 03/31/2021: Stage 0 (cTis (DCIS), cN0, cM0, G2, ER+, PR+, HER2: Not Assessed) - Signed by Cornelius Wanda DEL, MD on 05/01/2021 Histopathologic type: Intraductal carcinoma, noninfiltrating, NOS Stage prefix: Initial diagnosis Method of lymph node assessment: Clinical Nuclear grade: G2 Multigene prognostic tests performed:  None Histologic grading system: 3 grade system Laterality: Left Tumor size (mm): 3 Lymph-vascular invasion (LVI): LVI not present (absent)/not identified Diagnostic confirmation: Positive histology Specimen type: Excision Staged by: Managing physician Menopausal status: Premenopausal Prognostic indicators: Incidental finding at bilat mast for CHEK 2 mutation, prior invasive breast cancer in the contralateral breast Stage used in treatment planning: Yes National guidelines used in treatment planning: Yes Type of national guideline used in treatment planning: NCCN   05/01/2021 Initial Diagnosis   Ductal carcinoma in situ of left breast       INTERVAL HISTORY:  Brianna Merritt is here today for repeat clinical assessment. She states she continues tamoxifen  daily without significant difficulty. She has had hot flashes, which have lessened with time.  She still has occasional cramping pain, but more in the right upper extremity now, relieved with Robaxin . She otherwise denies changes in her reconstructions.  She tells me her mother expired 01/24/24 and she has been 'unable to cry'. I told her everyone grieves in their own way and she will probably have release of her feelings at some point.  She tells me her paternal aunt tested positive for the dementia gene but later called it the ALS gene. Dr. Charlanne performed her colonoscopy 01/11/24 and only found a hyperplatic polyp of the rectum, so she will have a repeat in 5 years, since she carries the CHEK 2 gene. I will see her back in 6 months for re-examination.  She denies clear nasal drainage  and sinus congestion which she attributes to seasonal allergies.  She denies fevers or chills. She denies pain elsewhere. Her appetite is good. Her weight has increased 1 pounds over last 6 months.  REVIEW OF SYSTEMS:  Review of Systems  Constitutional:  Negative for appetite change, chills, fatigue, fever and unexpected weight change.  HENT:   Negative for lump/mass,  mouth sores and sore throat.   Respiratory:  Negative for cough and shortness of breath.   Cardiovascular:  Negative for chest pain and leg swelling.  Gastrointestinal:  Negative for abdominal pain, constipation, diarrhea, nausea and vomiting.  Endocrine: Positive for hot flashes.  Genitourinary:  Negative for difficulty urinating, dysuria, frequency, hematuria and vaginal bleeding.   Musculoskeletal:  Negative for arthralgias, back pain, gait problem and myalgias.  Skin:  Negative for itching and rash.  Neurological:  Negative for dizziness, gait problem, headaches and light-headedness.  Hematological:  Negative for adenopathy. Does not bruise/bleed easily.  Psychiatric/Behavioral:  Negative for depression and sleep disturbance. The patient is not nervous/anxious.     VITALS:  Blood pressure 103/72, pulse 68, temperature 98.1 F (36.7 C), temperature source Oral, resp. rate 16, height 5' (1.524 m), weight 159 lb 1.6 oz (72.2 kg), last menstrual period 12/21/2020, SpO2 100%.  Wt Readings from Last 3 Encounters:  02/09/24 159 lb 1.6 oz (72.2 kg)  01/11/24 160 lb (72.6 kg)  12/14/23 160 lb (72.6 kg)    Body mass index is 31.07 kg/m.  Performance status (ECOG): 0 - Asymptomatic  PHYSICAL EXAM:  Physical Exam Vitals and nursing note reviewed.  Constitutional:      General: She is not in acute distress.    Appearance: Normal appearance. She is not ill-appearing.  HENT:     Head: Normocephalic and atraumatic.     Mouth/Throat:     Mouth: Mucous membranes are moist.     Pharynx: Oropharynx is clear. No oropharyngeal exudate or posterior oropharyngeal erythema.   Eyes:     General: No scleral icterus.    Extraocular Movements: Extraocular movements intact.     Conjunctiva/sclera: Conjunctivae normal.     Pupils: Pupils are equal, round, and reactive to light.    Cardiovascular:     Rate and Rhythm: Normal rate and regular rhythm.     Heart sounds: Normal heart sounds. No murmur  heard.    No friction rub. No gallop.  Pulmonary:     Effort: Pulmonary effort is normal.     Breath sounds: Normal breath sounds. No wheezing, rhonchi or rales.  Chest:  Breasts:    Right: Absent.     Left: Absent.     Comments: Bilateral reconstructions are negative Abdominal:     General: There is no distension.     Palpations: Abdomen is soft. There is no mass.     Tenderness: There is no abdominal tenderness.   Musculoskeletal:        General: Normal range of motion.     Cervical back: Normal range of motion and neck supple. No tenderness.     Right lower leg: No edema.     Left lower leg: No edema.  Lymphadenopathy:     Cervical: No cervical adenopathy.     Upper Body:     Right upper body: No supraclavicular or axillary adenopathy.     Left upper body: No supraclavicular or axillary adenopathy.     Lower Body: No right inguinal adenopathy. No left inguinal adenopathy.   Skin:    General: Skin  is warm and dry.     Coloration: Skin is not jaundiced.     Findings: No rash.   Neurological:     Mental Status: She is alert and oriented to person, place, and time.     Cranial Nerves: No cranial nerve deficit.   Psychiatric:        Mood and Affect: Mood normal.        Behavior: Behavior normal.        Thought Content: Thought content normal.     LABS:      Latest Ref Rng & Units 04/28/2021   12:00 AM 10/03/2020   12:00 AM 07/04/2020   11:22 AM  CBC  WBC  5.2  4.2     6.4   Hemoglobin 12.0 - 16.0 13.1  13.3     13.5   Hematocrit 36 - 46 39  39     40.6   Platelets 150 - 399 244  213     267      This result is from an external source.      Latest Ref Rng & Units 07/01/2021   10:13 AM 04/28/2021   12:00 AM 10/03/2020   12:00 AM  CMP  Glucose 70 - 99 mg/dL 898     BUN 6 - 20 mg/dL 10  13  10       Creatinine 0.44 - 1.00 mg/dL 9.28  0.8  0.8      Sodium 135 - 145 mmol/L 137  139  139      Potassium 3.5 - 5.1 mmol/L 3.8  3.7  4.3      Chloride 98 - 111 mmol/L  103  103  108      CO2 22 - 32 mmol/L 25  25  26       Calcium 8.9 - 10.3 mg/dL 8.8  8.8  9.0      Alkaline Phos 25 - 125  49  43      AST 13 - 35  29  18      ALT 7 - 35  15  19         This result is from an external source.       STUDIES:  No results found.    HISTORY:   Past Medical History:  Diagnosis Date   Anxiety    Anxiety    Asthma    Breast cancer (HCC) 2021   DDD (degenerative disc disease)    GERD (gastroesophageal reflux disease)    Migraine    Seizures (HCC)    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   Tobacco abuse 03/02/2022    Past Surgical History:  Procedure Laterality Date   ABDOMINAL HYSTERECTOMY  06/03/2022   BREAST LUMPECTOMY     BREAST RECONSTRUCTION WITH PLACEMENT OF TISSUE EXPANDER AND FLEX HD (ACELLULAR HYDRATED DERMIS) Bilateral 03/25/2021   Procedure: BILATERAL BREAST RECONSTRUCTION WITH PLACEMENT OF TISSUE EXPANDER AND FLEX HD (ACELLULAR HYDRATED DERMIS);  Surgeon: Lowery Estefana RAMAN, DO;  Location: [facility];  Service: Plastics;  Laterality: Bilateral;   CESAREAN SECTION     CHOLECYSTECTOMY     ENDOMETRIAL ABLATION     NIPPLE SPARING MASTECTOMY Bilateral 03/25/2021   Procedure: BILATERAL SIMPLE NIPPLE SPARING MASTECTOMY;  Surgeon: Joesph Lynwood DEL, MD;  Location:  SURGERY CENTER;  Service: General;  Laterality: Bilateral;   REMOVAL OF BILATERAL TISSUE EXPANDERS WITH PLACEMENT OF BILATERAL BREAST IMPLANTS Bilateral 07/01/2021   Procedure: REMOVAL OF BILATERAL  TISSUE EXPANDERS WITH PLACEMENT OF BILATERAL BREAST IMPLANTS;  Surgeon: Lowery Estefana RAMAN, DO;  Location: [facility];  Service: Plastics;  Laterality: Bilateral;  2 hours   TUBAL LIGATION      Family History  Problem Relation Age of Onset   Colitis Mother    Colon polyps Paternal Uncle    Colon cancer Paternal Uncle    Diabetes Paternal Grandmother    Esophageal cancer Neg Hx    Stomach cancer Neg Hx    Rectal cancer Neg Hx      Social History:  reports that she quit smoking about 3 years ago. Her smoking use included cigarettes. She started smoking about 35 years ago. She has a 49.6 pack-year smoking history. She has quit using smokeless tobacco. She reports that she does not drink alcohol and does not use drugs.The patient is alone today.  Allergies:  Allergies  Allergen Reactions   Ketorolac  Itching   Ketorolac  Tromethamine  Itching   Sumatriptan Anaphylaxis and Other (See Comments)    Stroke risk  Increased risk for stroke   Triptans Other (See Comments)    Hemiplegic migraine   Bactrim [Sulfamethoxazole-Trimethoprim] Hives   Meperidine Other (See Comments)    combative  Other reaction(s): Other (See Comments)  Combative    Other reaction(s): Other (See Comments)  combative  Combative  Combative, , Other reaction(s): Other (See Comments), combative, Combative   Meperidine Hcl     Combative   Soy Allergy (Obsolete) Other (See Comments)    Oncologist told patient not to take anything with soy in it.    Current Medications: Current Outpatient Medications  Medication Sig Dispense Refill   clonazePAM  (KLONOPIN ) 0.5 MG tablet Take 2 tablets (1 mg total) by mouth at bedtime. (Patient taking differently: Take by mouth at bedtime. Take 1/2 tablet by mouth every other night) 60 tablet 0   conjugated estrogens  (PREMARIN ) vaginal cream Place 1 Applicatorful vaginally daily. (Patient taking differently: Place 1 applicator vaginally daily as needed.) 42.5 g 12   diphenhydramine -acetaminophen  (TYLENOL  PM) 25-500 MG TABS tablet Take 1 tablet by mouth at bedtime as needed.     fluticasone (FLONASE) 50 MCG/ACT nasal spray Place into the nose. (Patient taking differently: Place into the nose as needed.)     ibuprofen  (ADVIL ) 800 MG tablet TAKE 1 TABLET BY MOUTH EVERY 6 HOURS AS NEEDED 60 tablet 0   levalbuterol (XOPENEX HFA) 45 MCG/ACT inhaler SMARTSIG:1 Puff(s) By Mouth 1-4 Times Daily (Patient taking  differently: as needed.)     methocarbamol  (ROBAXIN ) 500 MG tablet TAKE 1 TABLET BY MOUTH EVERY 6 HOURS AS NEEDED FOR MUSCLE SPASM 60 tablet 3   omeprazole  (PRILOSEC) 20 MG capsule Take 1 capsule (20 mg total) by mouth daily. Please call [phone] to schedule an office visit for refills 30 capsule 2   tamoxifen  (NOLVADEX ) 20 MG tablet Take 1 tablet by mouth once daily 90 tablet 3   TRULANCE 3 MG TABS Take 1 tablet by mouth daily. (Patient taking differently: Take 1 tablet by mouth daily. Takes it as needed)     venlafaxine  (EFFEXOR ) 37.5 MG tablet Take 1 tablet (37.5 mg total) by mouth daily. 30 tablet 5   Current Facility-Administered Medications  Medication Dose Route Frequency Provider Last Rate Last Admin   0.9 %  sodium chloride  infusion  500 mL Intravenous Once Charlanne Groom, MD         ASSESSMENT & PLAN:   Assessment & Plan: Brianna Merritt is a 48 y.o.  female   1.  Stage IA (T1b N0 M0) hormone receptor positive breast cancer April 2021, treated with lumpectomy and adjuvant radiation.  She continues adjuvant hormonal therapy with tamoxifen  daily with plans for total of 5 years of therapy. She remains without evidence of recurrence.   2.  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  She was treated with bilateral mastectomies with Dr. Joesph and underwent reconstruction with Dr. Lowery.   3.  CHEK2 heterozygous gene mutation.  This is a high risk gene related to an increased risk of breast cancer with her cancer risk being 20-31% when compared to the risk for the general population of 10.2%.  Her risk for developing a secondary primary within 10 years is up to 29%.  This gene is also associated with a possible elevated risk for colorectal cancer.  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 EGD and colonoscopy in March 2022 was negative.   4.  Seizure disorder; she has been without an episode for about  4 years.   5.  Depression/anxiety.  She is being weaned off clonazepam .  She continues venlafaxine  37.5 mg daily.  6. Hysterectomy and bilateral salpingo-oophorectomy October 2023.   7.  Vaginal dryness for which she has Premarin  cream.   I will see her back in 6 months for reevaluation. The patient understands the plans discussed today and is in agreement with them.  She knows to contact our office if she develops concerns prior to her next appointment.     I provided 15 minutes of face-to-face time during this encounter and > 50% was spent counseling as documented under my assessment and plan.    Wanda VEAR Cornish, MD  [facility] - A DEPT OF [facility] [address] [place] [address] Dept: [phone] Dept Fax: [phone]   No orders of the defined types were placed in this encounter.

## 2024-05-22 ENCOUNTER — Other Ambulatory Visit: Payer: Self-pay | Admitting: Oncology

## 2024-05-22 DIAGNOSIS — Z170 Estrogen receptor positive status [ER+]: Secondary | ICD-10-CM

## 2024-08-22 ENCOUNTER — Other Ambulatory Visit: Payer: Self-pay | Admitting: Gastroenterology

## 2024-08-29 ENCOUNTER — Telehealth: Payer: Self-pay | Admitting: Oncology

## 2024-08-29 ENCOUNTER — Encounter: Payer: Self-pay | Admitting: Oncology

## 2024-08-29 ENCOUNTER — Inpatient Hospital Stay: Attending: Oncology | Admitting: Oncology

## 2024-08-29 VITALS — BP 125/78 | HR 68 | Temp 98.1°F | Resp 18 | Ht 60.0 in | Wt 165.7 lb

## 2024-08-29 DIAGNOSIS — Z170 Estrogen receptor positive status [ER+]: Secondary | ICD-10-CM

## 2024-08-29 DIAGNOSIS — C50411 Malignant neoplasm of upper-outer quadrant of right female breast: Secondary | ICD-10-CM | POA: Diagnosis not present

## 2024-08-29 NOTE — Telephone Encounter (Signed)
 Patient has been scheduled for follow-up visit per 08/29/2024 LOS.  Pt noted appt details on personal electronic device.

## 2024-08-29 NOTE — Progress Notes (Signed)
 " [facility]  [address] [place],  [place]  [address] [phone]  Clinic Day:  08/29/24  Referring physician: Melonie Colonel, Mikel HERO, MD  CHIEF COMPLAINT:  CC: Stage IA hormone receptor positive breast cancer  Current Treatment:  Tamoxifen 20 mg daily  HISTORY OF PRESENT ILLNESS:  Brianna Merritt is a 49 y.o. 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  Annual mammography in March revealed possible asymmetries within the bilateral breasts. Bilateral diagnostic mammogram revealed a heterogeneous hypoechoic irregular mass with microlobulated and angular margins at the 10 oclock position 6 cm from the nipple measuring 7 x 9 x 6 mm in the right breast.  Ultrasound of the right axilla was negative.  No evidence of malignancy was seen in the left breast.  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  The invasive carcinoma involved all cores measuring 8 mm in maximal extent.  Estrogen receptor was 95% positive and progesterone receptor was 100% positive.  HER2 was equivocal (2+), negative by FISH.     Due to her personal and family history of breast cancer she underwent preoperative genetic testing with the [employer] myRisk Hereditary Cancer Panel.  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  This particular mutation is associated with an increased lifetime risk of breast cancer of 20 to 31%, with the risk of a 2nd breast cancer primary within 10 years of her 1st breast cancer up to 29%.  There is insufficient evidence to recommend prophylactic mastectomy with a CHEK2 mutation. There is also possibly an elevated risk of colorectal cancer associated with a CHEK2 mutation, which cannot be quantified. There was also a variant of  uncertain significance (VUS) found in the MSH2.   She was treated with right lumpectomy and sentinel lymph node biopsy in April 2021.  Final pathology revealed an 8 mm, grade 2,  invasive ductal carcinoma with ductal carcinoma in situ, intermediate nuclear grade.  Margins were free of neoplasm.  One sentinel lymph node was negative for metastatic carcinoma (0/1).  She was premenopausal, and had irregular menses, so was taking Lo Loestrin 1 mg/10 mcg for about 5 months prior to her diagnosis.  This was discontinued following her diagnosis.  EndoPredict revealed a 12 gene molecular score of 7.2 with an EPclin score of 3.0, which is considered low risk.  Her risk for distant recurrence within 10 years is 7.4% with adjuvant radiation and endocrine therapy alone, with an absolute chemotherapy benefit of 1.9%.  Her risk for distant recurrence in 5-15 years is 5.8%.  Adjuvant chemotherapy was not recommended.  She completed adjuvant radiation to the right breast in July. She was placed on adjuvant hormonal therapy with tamoxifen 20 mg daily in September 2021. She was on duloxetine at that time and due to the interaction with tamoxifen, she was switched to venlafaxine , but later stopped it.   Colonoscopy and EGD were done in 2004.  Colonoscopy was unremarkable.  EGD revealed hiatal hernia, mild distal esophagitis and healed gastric ulcer.  We recommended repeat colonoscopy due to the CHEK2 mutation. EGD and colonoscopy in March 2022 did not reveal any significant abnormality.  She did have removal of 2 polyps, 1 of which was a villous adenoma, and the other a hyperplastic polyp.  She had an abnormal mammogram in March 2022 that revealed calcifications within the bilateral breasts. On MRI breast these areas were suspicious.  There was no corresponding mass on ultrasound.   MRI guided biopsy of the right breast upper inner quadrant and left breast upper inner quadrant revealed fibroadenomatoid changes and fibrocystic  with usual ductal hyperplasia, fibroadenomatoid changes respectively.  Stereotactic biopsy of 2 areas the right lower outer quadrant was also recommended.  Pathology of the posterior depth revealed diminutive complex sclerosing lesion with calcifications. Pathology of the anterior depth revealed benign glands with calcifications. She was scheduled for a stereotactic biopsy of 2 areas of the left breast upper outer quadrant, but she saw Dr. Joesph.  Due to the need for multiple biopsies and increased risk for breast cancer with her CHEK2 mutation, they decided on bilateral mastectomies and immediate breast reconstruction which was done in August 2022.  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  Margins uninvolved by carcinoma.  She also had a complex sclerosing lesion, fibrocystic changes with apocrine metaplasia and adenosis and calcifications seen in the left breast.  Right breast revealed fibroadenomatoid and fibrocystic changes with calcifications.  Previous surgical site changes.  No malignancy identified.    She underwent hysterectomy/BSO in 2023.  Oncology History  Malignant neoplasm of upper-outer quadrant of right female breast (HCC)  12/06/2019 Initial Diagnosis   Breast cancer, right (HCC)   12/06/2019 Cancer Staging   Staging form: Breast, AJCC 8th Edition - Clinical stage from 12/06/2019: Stage IA (cT1b, cN0(sn), cM0, G2, ER+, PR+, HER2-) - Signed by Cornelius Brianna DEL, MD on 07/07/2020   Ductal carcinoma in situ of left breast  03/31/2021 Cancer Staging   Staging form: Breast, AJCC 8th Edition - Clinical stage from 03/31/2021: Stage 0 (cTis (DCIS), cN0, cM0, G2, ER+, PR+, HER2: Not Assessed) - Signed by Cornelius Brianna DEL, MD on 05/01/2021 Histopathologic type: Intraductal carcinoma, noninfiltrating, NOS Stage prefix: Initial diagnosis Method of lymph node assessment: Clinical Nuclear grade: G2 Multigene prognostic  tests performed: None Histologic grading system: 3 grade system Laterality: Left Tumor size (mm): 3 Lymph-vascular invasion (LVI): LVI not present (absent)/not identified Diagnostic confirmation: Positive histology Specimen type: Excision Staged by: Managing physician Menopausal status: Premenopausal Prognostic indicators: Incidental finding at bilat mast for CHEK 2 mutation, prior invasive breast cancer in the contralateral breast Stage used in treatment planning: Yes National guidelines used in treatment planning: Yes Type of national guideline used in treatment planning: NCCN   05/01/2021 Initial Diagnosis   Ductal carcinoma in situ of left breast     INTERVAL HISTORY:  Brianna Merritt is here today for repeat clinical assessment for her stage IA hormone receptor positive breast cancer. Patient states that she feels well but complains of chronic but improved hot flashes and general aches. She states that she still cannot cry, even when her mom passed. She continues tamoxifen 20 mg once daily without difficulty but states that she has not been as consistent with taking it. She started taking Tamoxifen in September, 2021. She informed me about tiny cyst like lesions at the lateral left nipple in the areolar complex and upon physical examination I found these to be benign. Her PCP continues routine labs. I will see her back in 8 months for reevaluation. She will then have completed 5 years of tamoxifen and this can be stopped.   She denies fever, chills, night sweats, or other signs of infection. She denies cardiorespiratory and gastrointestinal  issues. Her appetite is on and off and Her weight has increased 6 pounds over last 6.5 months.  REVIEW OF SYSTEMS:  Review of Systems  Constitutional:  Negative for appetite change, chills, fatigue, fever and unexpected weight change.  HENT:  Negative.  Negative for lump/mass, mouth sores and sore throat.   Eyes: Negative.   Respiratory: Negative.  Negative  for chest tightness, cough, hemoptysis, shortness of breath and wheezing.   Cardiovascular: Negative.  Negative for chest pain, leg swelling and palpitations.  Gastrointestinal: Negative.  Negative for abdominal distention, abdominal pain, blood in stool, constipation, diarrhea, nausea, rectal pain and vomiting.  Endocrine: Positive for hot flashes.  Genitourinary: Negative.  Negative for difficulty urinating, dysuria, frequency, hematuria and vaginal bleeding.   Musculoskeletal:  Positive for arthralgias and myalgias. Negative for back pain, flank pain, gait problem, neck pain and neck stiffness.  Skin: Negative.  Negative for itching and rash.  Neurological:  Negative for dizziness, extremity weakness, gait problem, headaches, light-headedness, numbness, seizures and speech difficulty.  Hematological: Negative.  Negative for adenopathy. Does not bruise/bleed easily.  Psychiatric/Behavioral: Negative.  Negative for depression and sleep disturbance. The patient is not nervous/anxious.    VITALS:  Blood pressure 125/78, pulse 68, temperature 98.1 F (36.7 C), temperature source Oral, resp. rate 18, height 5' (1.524 m), weight 165 lb 11.2 oz (75.2 kg), last menstrual period 12/21/2020, SpO2 100%.  Wt Readings from Last 3 Encounters:  08/29/24 165 lb 11.2 oz (75.2 kg)  02/09/24 159 lb 1.6 oz (72.2 kg)  01/11/24 160 lb (72.6 kg)    Body mass index is 32.36 kg/m.  Performance status (ECOG): 0 - Asymptomatic  PHYSICAL EXAM:  Physical Exam Vitals and nursing note reviewed.  Constitutional:      General: She is not in acute distress.    Appearance: Normal appearance. She is normal weight. She is not ill-appearing, toxic-appearing or diaphoretic.  HENT:     Head: Normocephalic and atraumatic.     Right Ear: Tympanic membrane, ear canal and external ear normal. There is no impacted cerumen.     Left Ear: Tympanic membrane, ear canal and external ear normal. There is no impacted cerumen.      Nose: Nose normal. No congestion or rhinorrhea.     Mouth/Throat:     Mouth: Mucous membranes are moist.     Pharynx: Oropharynx is clear. No oropharyngeal exudate or posterior oropharyngeal erythema.  Eyes:     General: No scleral icterus.       Right eye: No discharge.        Left eye: No discharge.     Extraocular Movements: Extraocular movements intact.     Conjunctiva/sclera: Conjunctivae normal.     Pupils: Pupils are equal, round, and reactive to light.  Neck:     Vascular: No carotid bruit.  Cardiovascular:     Rate and Rhythm: Normal rate and regular rhythm.     Pulses: Normal pulses.     Heart sounds: Normal heart sounds. No murmur heard.    No friction rub. No gallop.  Pulmonary:     Effort: Pulmonary effort is normal.     Breath sounds: Normal breath sounds. No wheezing, rhonchi or rales.  Chest:  Breasts:    Right: Absent.     Left: Absent.     Comments: Bilateral reconstructions are negative She does have a scar in the upper outer quadrant of the right breast which is well healed and a right axillary incision which is  well healed.  Abdominal:     General: Bowel sounds are normal. There is no distension.     Palpations: Abdomen is soft. There is no mass.     Tenderness: There is no abdominal tenderness. There is no right CVA tenderness, left CVA tenderness, guarding or rebound.     Hernia: No hernia is present.  Musculoskeletal:        General: Normal range of motion.     Cervical back: Normal range of motion and neck supple. No rigidity or tenderness.     Right lower leg: No edema.     Left lower leg: No edema.  Lymphadenopathy:     Cervical: No cervical adenopathy.     Upper Body:     Right upper body: No supraclavicular or axillary adenopathy.     Left upper body: No supraclavicular or axillary adenopathy.     Lower Body: No right inguinal adenopathy. No left inguinal adenopathy.  Skin:    General: Skin is warm and dry.     Coloration: Skin is not  jaundiced.     Findings: No rash.  Neurological:     General: No focal deficit present.     Mental Status: She is alert and oriented to person, place, and time. Mental status is at baseline.     Cranial Nerves: No cranial nerve deficit.  Psychiatric:        Mood and Affect: Mood normal.        Behavior: Behavior normal.        Thought Content: Thought content normal.        Judgment: Judgment normal.    LABS:      Latest Ref Rng & Units 04/28/2021   12:00 AM 10/03/2020   12:00 AM 07/04/2020   11:22 AM  CBC  WBC  5.2  4.2     6.4   Hemoglobin 12.0 - 16.0 13.1  13.3     13.5   Hematocrit 36 - 46 39  39     40.6   Platelets 150 - 399 244  213     267      This result is from an external source.      Latest Ref Rng & Units 07/01/2021   10:13 AM 04/28/2021   12:00 AM 10/03/2020   12:00 AM  CMP  Glucose 70 - 99 mg/dL 898     BUN 6 - 20 mg/dL 10  13  10       Creatinine 0.44 - 1.00 mg/dL 9.28  0.8  0.8      Sodium 135 - 145 mmol/L 137  139  139      Potassium 3.5 - 5.1 mmol/L 3.8  3.7  4.3      Chloride 98 - 111 mmol/L 103  103  108      CO2 22 - 32 mmol/L 25  25  26       Calcium 8.9 - 10.3 mg/dL 8.8  8.8  9.0      Alkaline Phos 25 - 125  49  43      AST 13 - 35  29  18      ALT 7 - 35  15  19         This result is from an external source.   STUDIES:  No results found.    HISTORY:   Past Medical History:  Diagnosis Date   Anxiety    Anxiety    Asthma  Breast cancer (HCC) 2021   DDD (degenerative disc disease)    GERD (gastroesophageal reflux disease)    Migraine    Seizures (HCC)    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   Tobacco abuse 03/02/2022    Past Surgical History:  Procedure Laterality Date   ABDOMINAL HYSTERECTOMY  06/03/2022   BREAST LUMPECTOMY     BREAST RECONSTRUCTION WITH PLACEMENT OF TISSUE EXPANDER AND FLEX HD (ACELLULAR HYDRATED DERMIS) Bilateral 03/25/2021   Procedure: BILATERAL BREAST RECONSTRUCTION WITH PLACEMENT OF TISSUE EXPANDER  AND FLEX HD (ACELLULAR HYDRATED DERMIS);  Surgeon: Lowery Estefana RAMAN, DO;  Location: [facility];  Service: Plastics;  Laterality: Bilateral;   CESAREAN SECTION     CHOLECYSTECTOMY     ENDOMETRIAL ABLATION     NIPPLE SPARING MASTECTOMY Bilateral 03/25/2021   Procedure: BILATERAL SIMPLE NIPPLE SPARING MASTECTOMY;  Surgeon: Joesph Lynwood DEL, MD;  Location: [facility];  Service: General;  Laterality: Bilateral;   REMOVAL OF BILATERAL TISSUE EXPANDERS WITH PLACEMENT OF BILATERAL BREAST IMPLANTS Bilateral 07/01/2021   Procedure: REMOVAL OF BILATERAL TISSUE EXPANDERS WITH PLACEMENT OF BILATERAL BREAST IMPLANTS;  Surgeon: Lowery Estefana RAMAN, DO;  Location: [facility];  Service: Plastics;  Laterality: Bilateral;  2 hours   TUBAL LIGATION      Family History  Problem Relation Age of Onset   Colitis Mother    Colon polyps Paternal Uncle    Colon cancer Paternal Uncle    Diabetes Paternal Grandmother    Esophageal cancer Neg Hx    Stomach cancer Neg Hx    Rectal cancer Neg Hx     Social History:  reports that she quit smoking about 3 years ago. Her smoking use included cigarettes. She started smoking about 36 years ago. She has a 49.6 pack-year smoking history. She has quit using smokeless tobacco. She reports that she does not drink alcohol and does not use drugs.The patient is alone today.  Allergies:  Allergies  Allergen Reactions   Ketorolac  Itching   Ketorolac  Tromethamine  Itching   Sumatriptan Anaphylaxis and Other (See Comments)    Stroke risk  Increased risk for stroke   Triptans Other (See Comments)    Hemiplegic migraine   Bactrim [Sulfamethoxazole-Trimethoprim] Hives   Meperidine Other (See Comments)    combative  Other reaction(s): Other (See Comments)  Combative    Other reaction(s): Other (See Comments)  combative  Combative  Combative, , Other reaction(s): Other (See Comments), combative, Combative   Meperidine Hcl      Combative   Soy Allergy (Obsolete) Other (See Comments)    Oncologist told patient not to take anything with soy in it.    Current Medications: Current Outpatient Medications  Medication Sig Dispense Refill   pramipexole (MIRAPEX) 0.125 MG tablet Take by mouth.     venlafaxine  XR (EFFEXOR -XR) 75 MG 24 hr capsule Take 75 mg by mouth daily.     conjugated estrogens  (PREMARIN ) vaginal cream Place 1 Applicatorful vaginally daily. (Patient taking differently: Place 1 applicator vaginally daily as needed.) 42.5 g 12   diphenhydramine -acetaminophen  (TYLENOL  PM) 25-500 MG TABS tablet Take 1 tablet by mouth at bedtime as needed.     fluticasone (FLONASE) 50 MCG/ACT nasal spray Place into the nose. (Patient taking differently: Place into the nose as needed.)     ibuprofen  (ADVIL ) 800 MG tablet TAKE 1 TABLET BY MOUTH EVERY 6 HOURS AS NEEDED 60 tablet 0   levalbuterol (XOPENEX HFA) 45 MCG/ACT inhaler SMARTSIG:1 Puff(s) By  Mouth 1-4 Times Daily (Patient taking differently: as needed.)     methocarbamol  (ROBAXIN ) 500 MG tablet TAKE 1 TABLET BY MOUTH EVERY 6 HOURS AS NEEDED FOR MUSCLE SPASM 60 tablet 5   omeprazole  (PRILOSEC) 20 MG capsule Take 1 capsule (20 mg total) by mouth daily. Please call [phone] to schedule an office visit for refills 30 capsule 2   tamoxifen (NOLVADEX) 20 MG tablet Take 1 tablet by mouth once daily 90 tablet 3   TRULANCE 3 MG TABS Take 1 tablet by mouth daily. (Patient taking differently: Take 1 tablet by mouth daily. Takes it as needed)     Current Facility-Administered Medications  Medication Dose Route Frequency Provider Last Rate Last Admin   0.9 %  sodium chloride  infusion  500 mL Intravenous Once Charlanne Groom, MD       ASSESSMENT & PLAN:  Assessment: 1.  Stage IA (T1b N0 M0) hormone receptor positive breast cancer April 2021, treated with lumpectomy and adjuvant radiation.  She continues adjuvant hormonal therapy with tamoxifen daily with plans for total of 5  years of therapy. She remains without evidence of recurrence.   2.  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  She was treated with bilateral mastectomies with Dr. Joesph and underwent reconstruction with Dr. Lowery. There were multiple findings, including a complex sclerosing lesion on the left.   3.  CHEK2 heterozygous gene mutation.  This is a high risk gene related to an increased risk of breast cancer with her cancer risk being 20-31% when compared to the risk for the general population of 10.2%.  Her risk for developing a secondary primary within 10 years is up to 29%.  This gene is also associated with a possible elevated risk for colorectal cancer.  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 EGD and colonoscopy in March 2022 was negative.   4.  Seizure disorder; she has been without an episode for about 4 years.   5.  Depression/anxiety.  She is being weaned off clonazepam .  She continues venlafaxine  37.5 mg daily.  6. Hysterectomy and bilateral salpingo-oophorectomy October 2023.   Plan: he states that she still cannot cry, even when her mom passed. She continues tamoxifen 20 mg once daily without difficulty but states that she has not been as consistent with taking it. She started taking Tamoxifen in September, 2021. She informed me about tiny cyst like lesions at the lateral left nipple in the areolar complex and upon physical examination I found these to be benign. Her PCP continues routine labs. I will see her back in 8 months for reevaluation. She will then have completed 5 years of tamoxifen and this can be stopped. The patient understands the plans discussed today and is in agreement with them.  She knows to contact our office if she develops concerns prior to her next visit.   I provided 11 minutes of face-to-face time during this encounter and > 50% was spent counseling as documented under my  assessment and plan.   Brianna VEAR Cornish, MD  [facility] A DEPT OF [facility] [address] [place] [address] Dept: [phone] Dept Fax: [phone]   No orders of the defined types were placed in this encounter.    I,Jasmine M Lassiter,acting as a scribe for Brianna VEAR Cornish, MD.,have documented all relevant documentation on the behalf of Brianna VEAR Cornish,  MD,as directed by  Brianna VEAR Cornish, MD while in the presence of Brianna VEAR Cornish, MD.  "

## 2025-05-08 ENCOUNTER — Inpatient Hospital Stay: Admitting: Oncology
